# Patient Record
Sex: Female | Born: 1960
Health system: Southern US, Community
[De-identification: ages and names within clinical notes are randomized; demographics above are authoritative.]

## PROBLEM LIST (undated history)

## (undated) DIAGNOSIS — M1990 Unspecified osteoarthritis, unspecified site: Secondary | ICD-10-CM

## (undated) DIAGNOSIS — J449 Chronic obstructive pulmonary disease, unspecified: Secondary | ICD-10-CM

## (undated) DIAGNOSIS — J45909 Unspecified asthma, uncomplicated: Secondary | ICD-10-CM

## (undated) DIAGNOSIS — K219 Gastro-esophageal reflux disease without esophagitis: Secondary | ICD-10-CM

## (undated) DIAGNOSIS — E785 Hyperlipidemia, unspecified: Secondary | ICD-10-CM

## (undated) DIAGNOSIS — G43909 Migraine, unspecified, not intractable, without status migrainosus: Secondary | ICD-10-CM

## (undated) DIAGNOSIS — F32A Depression, unspecified: Secondary | ICD-10-CM

## (undated) DIAGNOSIS — G4730 Sleep apnea, unspecified: Secondary | ICD-10-CM

## (undated) DIAGNOSIS — F329 Major depressive disorder, single episode, unspecified: Secondary | ICD-10-CM

## (undated) DIAGNOSIS — J439 Emphysema, unspecified: Secondary | ICD-10-CM

## (undated) DIAGNOSIS — F419 Anxiety disorder, unspecified: Secondary | ICD-10-CM

## (undated) HISTORY — PX: RHINOPLASTY: SUR1284

## (undated) HISTORY — PX: SPINE SURGERY: SHX786

## (undated) HISTORY — DX: Hyperlipidemia, unspecified: E78.5

## (undated) HISTORY — DX: Sleep apnea, unspecified: G47.30

## (undated) HISTORY — DX: Unspecified asthma, uncomplicated: J45.909

## (undated) HISTORY — DX: Migraine, unspecified, not intractable, without status migrainosus: G43.909

## (undated) HISTORY — PX: ABDOMINAL HYSTERECTOMY: SHX81

## (undated) HISTORY — DX: Emphysema, unspecified: J43.9

## (undated) HISTORY — DX: Major depressive disorder, single episode, unspecified: F32.9

## (undated) HISTORY — DX: Anxiety disorder, unspecified: F41.9

## (undated) HISTORY — DX: Gastro-esophageal reflux disease without esophagitis: K21.9

## (undated) HISTORY — DX: Depression, unspecified: F32.A

## (undated) HISTORY — PX: BRAIN SURGERY: SHX531

## (undated) HISTORY — DX: Unspecified osteoarthritis, unspecified site: M19.90

## (undated) HISTORY — PX: TUBAL LIGATION: SHX77

## (undated) HISTORY — DX: Chronic obstructive pulmonary disease, unspecified: J44.9

---

## 2011-10-10 LAB — PULMONARY FUNCTION TEST

## 2013-01-20 HISTORY — PX: CERVICAL FUSION: SHX112

## 2013-10-10 DIAGNOSIS — G9520 Unspecified cord compression: Secondary | ICD-10-CM | POA: Insufficient documentation

## 2013-11-01 LAB — PULMONARY FUNCTION TEST

## 2014-02-20 ENCOUNTER — Encounter: Payer: Self-pay | Admitting: Family Medicine

## 2014-02-20 ENCOUNTER — Ambulatory Visit (INDEPENDENT_AMBULATORY_CARE_PROVIDER_SITE_OTHER): Payer: Managed Care, Other (non HMO) | Admitting: Family Medicine

## 2014-02-20 VITALS — BP 115/84 | HR 106 | Temp 97.8°F | Ht 62.0 in | Wt 169.0 lb

## 2014-02-20 DIAGNOSIS — G609 Hereditary and idiopathic neuropathy, unspecified: Secondary | ICD-10-CM

## 2014-02-20 DIAGNOSIS — G4459 Other complicated headache syndrome: Secondary | ICD-10-CM

## 2014-02-20 DIAGNOSIS — M79641 Pain in right hand: Secondary | ICD-10-CM

## 2014-02-20 DIAGNOSIS — M79642 Pain in left hand: Secondary | ICD-10-CM

## 2014-02-20 DIAGNOSIS — M25542 Pain in joints of left hand: Secondary | ICD-10-CM

## 2014-02-20 DIAGNOSIS — M25541 Pain in joints of right hand: Secondary | ICD-10-CM

## 2014-02-20 LAB — POCT CBC
GRANULOCYTE PERCENT: 46.8 % (ref 37–80)
HCT, POC: 45.1 % (ref 37.7–47.9)
Hemoglobin: 13.7 g/dL (ref 12.2–16.2)
LYMPH, POC: 2.8 (ref 0.6–3.4)
MCH, POC: 27.4 pg (ref 27–31.2)
MCHC: 30.5 g/dL — AB (ref 31.8–35.4)
MCV: 89.8 fL (ref 80–97)
MPV: 6.8 fL (ref 0–99.8)
PLATELET COUNT, POC: 363 10*3/uL (ref 142–424)
POC Granulocyte: 2.8 (ref 2–6.9)
POC LYMPH %: 46.3 % (ref 10–50)
RBC: 5 M/uL (ref 4.04–5.48)
RDW, POC: 13.4 %
WBC: 6 10*3/uL (ref 4.6–10.2)

## 2014-02-20 MED ORDER — TOPIRAMATE 25 MG PO TABS: ORAL_TABLET | ORAL | Status: DC

## 2014-02-20 NOTE — Patient Instructions (Signed)
Take a 325 mg enteric coated aspirin daily.

## 2014-02-20 NOTE — Progress Notes (Signed)
Subjective:    Patient ID: Amy Gray, female    DOB: 1960/10/21, 54 y.o.   MRN: [id number]  HPI  Patient is here today to establish care.  She is also having some extremity weakness and intermitent dizziness. Patient has moderate diffuse weakness. She has been under the care of neurology. Dr. Tod Persia in [place] has had an MRI done on her head recently that showed some white matter changes that he felt were nonspecific and not likely to be multiple sclerosis. However no diagnosis was made. She continues to have headache intermittent weakness and numbness that is episodic and migratory. MRI report of the brain is reviewed and is attached.     Review of Systems  Constitutional: Negative for fever, chills, diaphoresis, appetite change, fatigue and unexpected weight change.  HENT: Negative for congestion, ear pain, hearing loss, postnasal drip, rhinorrhea, sneezing, sore throat and trouble swallowing.   Eyes: Negative for pain.  Respiratory: Negative for cough, chest tightness and shortness of breath.   Cardiovascular: Negative for chest pain and palpitations.  Gastrointestinal: Negative for nausea, vomiting, abdominal pain, diarrhea and constipation.  Genitourinary: Negative for dysuria, frequency and menstrual problem.  Musculoskeletal: Negative for joint swelling and arthralgias.  Skin: Negative for rash.  Neurological: Positive for dizziness (described as lightheadedness without vertigo), weakness, numbness and headaches (frontal aching without throbbing or vision change). Negative for tremors, seizures, syncope and speech difficulty.  Psychiatric/Behavioral: Negative for dysphoric mood and agitation.       Objective:   Physical Exam  Constitutional: She is oriented to person, place, and time. She appears well-developed and well-nourished. No distress.  HENT:  Head: Normocephalic and atraumatic.  Right Ear: External ear normal.  Left Ear: External ear normal.  Nose: Nose  normal.  Mouth/Throat: Oropharynx is clear and moist.  Eyes: Conjunctivae and EOM are normal. Pupils are equal, round, and reactive to light.  Neck: Normal range of motion. Neck supple. No thyromegaly present.  Cardiovascular: Normal rate, regular rhythm and normal heart sounds.   No murmur heard. Pulmonary/Chest: Effort normal and breath sounds normal. No respiratory distress. She has no wheezes. She has no rales.  Abdominal: Soft. Bowel sounds are normal. She exhibits no distension. There is no tenderness.  Lymphadenopathy:    She has no cervical adenopathy.  Neurological: She is alert and oriented to person, place, and time. She has normal reflexes.  Skin: Skin is warm and dry.  Psychiatric: She has a normal mood and affect. Her behavior is normal. Judgment and thought content normal.   BP 115/84 mmHg  Pulse 106  Temp(Src) 97.8 F (36.6 C) (Oral)  Ht  (1.575 m)  Wt 169 lb (76.658 kg)  BMI 30.90 kg/m2        Assessment & Plan:   1. Other complicated headache syndrome   2. Hereditary and idiopathic peripheral neuropathy   3. Arthralgia of both hands     Meds ordered this encounter  Medications  . albuterol (PROVENTIL HFA;VENTOLIN HFA) 108 (90 BASE) MCG/ACT inhaler    Sig: Inhale 2 puffs into the lungs.  Marland Kitchen DISCONTD: albuterol (PROVENTIL) (2.5 MG/3ML) 0.083% nebulizer solution    Sig: 2.5 mg.  . DISCONTD: ALPRAZolam (XANAX) 0.25 MG tablet    Sig: Take 0.5 mg by mouth.  Marland Kitchen atorvastatin (LIPITOR) 40 MG tablet    Sig: Take 40 mg by mouth.  . budesonide-formoterol (SYMBICORT) 160-4.5 MCG/ACT inhaler    Sig: Inhale 2 puffs into the lungs.  . cetirizine (ZYRTEC) 10 MG  tablet    Sig: Take 10 mg by mouth.  . Cholecalciferol (VITAMIN D3) 5000 UNITS TABS    Sig: Take 1 tablet by mouth.  . cyclobenzaprine (FLEXERIL) 10 MG tablet    Sig: Take 10 mg by mouth.  . DISCONTD: estradiol (ESTRACE) 0.5 MG tablet    Sig: Take 0.5 mg by mouth.  Marland Kitchen. HYDROcodone-acetaminophen (VICODIN)  5-500 MG per tablet    Sig: Take 1 tablet by mouth.  . montelukast (SINGULAIR) 10 MG tablet    Sig: Take 10 mg by mouth.  . DISCONTD: traMADol (ULTRAM) 50 MG tablet    Sig: 1-2 tablets every 6 hours as needed  . omeprazole (PRILOSEC) 20 MG capsule    Sig: Take 20 mg by mouth.  . DISCONTD: HYDROcodone-acetaminophen (NORCO/VICODIN) 5-325 MG per tablet    Sig:   . estradiol (ESTRACE) 0.5 MG tablet    Sig:   . DISCONTD: cyclobenzaprine (FLEXERIL) 10 MG tablet    Sig:   . traMADol (ULTRAM) 50 MG tablet    Sig:   . ALPRAZolam (XANAX) 0.5 MG tablet    Sig:   . topiramate (TOPAMAX) 25 MG tablet    Sig: One at bedtime for 1 week. Then 2 for 1 week. Then 3 for 1 week. Then 4 a day.    Dispense:  120 tablet    Refill:  2    Orders Placed This Encounter  Procedures  . C-reactive protein  . Rheumatoid factor  . POCT CBC    Labs pending Health Maintenance reviewed Diet and exercise encouraged Continue all meds as discussed Follow up in 3 mos  Mechele ClaudeWarren Edee Nifong, MD

## 2014-02-21 ENCOUNTER — Telehealth: Payer: Self-pay | Admitting: Family Medicine

## 2014-02-21 LAB — C-REACTIVE PROTEIN: CRP: 1.8 mg/L (ref 0.0–4.9)

## 2014-02-21 LAB — RHEUMATOID FACTOR: Rhuematoid fact SerPl-aCnc: 9.9 IU/mL (ref 0.0–13.9)

## 2014-02-21 NOTE — Telephone Encounter (Signed)
Patient complaining of heaviness and weight in her chest last night. Gave call to triage.

## 2014-02-22 ENCOUNTER — Telehealth: Payer: Self-pay | Admitting: Family Medicine

## 2014-02-22 MED ORDER — SERTRALINE HCL 100 MG PO TABS: 100.0000 mg | ORAL_TABLET | Freq: Every day | ORAL | Status: DC

## 2014-02-22 NOTE — Telephone Encounter (Signed)
I agree that anxiety is very likely. A scrip for sertraline  has been sent to her pharmacy.

## 2014-02-22 NOTE — Telephone Encounter (Signed)
Patient aware and verbalizes understanding. 

## 2014-02-22 NOTE — Telephone Encounter (Signed)
Patient notified of lab results. States that she called our office yesterday with symptoms of a heart attack and triage advised her to go to the ER. Patient went to [facility] and they did full cardiac work up which was negative. They seem to be leaning towards anxiety and anxiety attacks. Patient states that she takes xanax 0.5 bid and has been on this for several years. Wonders if she needs something longer lasting. States that she tried cymbalta a few years ago and it made her very irritable. Also states that sometimes at night she will wake up and be very angry. Wants to know if a new med can be tried. Please advise and [facility]

## 2014-03-01 DIAGNOSIS — J45909 Unspecified asthma, uncomplicated: Secondary | ICD-10-CM | POA: Insufficient documentation

## 2014-03-01 DIAGNOSIS — E782 Mixed hyperlipidemia: Secondary | ICD-10-CM | POA: Insufficient documentation

## 2014-03-01 DIAGNOSIS — R002 Palpitations: Secondary | ICD-10-CM | POA: Insufficient documentation

## 2014-03-07 ENCOUNTER — Telehealth: Payer: Self-pay | Admitting: Family Medicine

## 2014-03-07 ENCOUNTER — Other Ambulatory Visit: Payer: Self-pay | Admitting: *Deleted

## 2014-03-07 MED ORDER — MONTELUKAST SODIUM 10 MG PO TABS: 10.0000 mg | ORAL_TABLET | Freq: Every day | ORAL | Status: DC

## 2014-03-07 NOTE — Telephone Encounter (Signed)
Have her DC the propranolol. We will sort this out better at her follow up

## 2014-03-07 NOTE — Telephone Encounter (Signed)
Patient feels really agitated and has restless legs about 1 hour after taking the propanolol.  This was prescribed at [facility] for palpitations.  She has appointment to follow up on 03/14/14. She is able to monitor her blood pressure and pulse at home. Asked her to keep a diary of these readings and any symptoms she is having and to bring it to her appointment.

## 2014-03-07 NOTE — Telephone Encounter (Signed)
Patient advised.

## 2014-03-14 ENCOUNTER — Ambulatory Visit (INDEPENDENT_AMBULATORY_CARE_PROVIDER_SITE_OTHER): Payer: Managed Care, Other (non HMO) | Admitting: Family Medicine

## 2014-03-14 ENCOUNTER — Encounter: Payer: Self-pay | Admitting: Family Medicine

## 2014-03-14 VITALS — BP 105/66 | HR 95 | Temp 97.4°F | Ht 62.0 in | Wt 166.8 lb

## 2014-03-14 DIAGNOSIS — F411 Generalized anxiety disorder: Secondary | ICD-10-CM

## 2014-03-14 DIAGNOSIS — J4530 Mild persistent asthma, uncomplicated: Secondary | ICD-10-CM | POA: Diagnosis not present

## 2014-03-14 DIAGNOSIS — R002 Palpitations: Secondary | ICD-10-CM

## 2014-03-14 MED ORDER — ALPRAZOLAM 0.5 MG PO TABS: 0.5000 mg | ORAL_TABLET | Freq: Two times a day (BID) | ORAL | Status: DC

## 2014-03-14 NOTE — Progress Notes (Signed)
Subjective:  Patient ID: Amy Gray, female    DOB: 1960/07/28  Age: 54 y.o. MRN: [id number]  CC: Follow-up   HPI Amy Gray presents for in today for follow-up of multiple symptoms including chest pain and paresthesia. During this last month she went ahead and had her stress echocardiogram which turned out normal. However before that could be done she went to the emergency room for chest pain. She was started on propranolol and after few days that made her feel so agitated that we stop that. The symptom went away. However it's been replaced by some new symptoms. She also stated that when she increased to the sertraline to 100 mg a full tablet, that she experienced agitation with that as well. She is still having headaches regularly and saw the headache clinic just yesterday. That note is attached in care everywhere. Basically she was told to try baclofen to relieve her headache. She is also taking cyclobenzaprine she was told not to blend of those 2. The patient for the last several weeks has had twitching in her arms and legs it is not synchronous in anyway it's twitches first-year then they're unpredictably. There is no association with activity per se and no rhyme or reason to where it's can happen. Patient started last week drinking more water and less of other fluids and has noted that her constant headache has diminished somewhat. Patient intends to follow with the headache clinic for headache   History Amy Gray has no past medical history on file.   She has no past surgical history on file.   Her family history is not on file.She reports that she has never smoked. She does not have any smokeless tobacco history on file. She reports that she drinks alcohol. She reports that she does not use illicit drugs.  Current Outpatient Prescriptions on File Prior to Visit  Medication Sig Dispense Refill  . albuterol (PROVENTIL HFA;VENTOLIN HFA) 108 (90 BASE) MCG/ACT inhaler Inhale 2 puffs  into the lungs.    Marland Kitchen. atorvastatin (LIPITOR) 40 MG tablet Take 40 mg by mouth.    . budesonide-formoterol (SYMBICORT) 160-4.5 MCG/ACT inhaler Inhale 2 puffs into the lungs.    . Cholecalciferol (VITAMIN D3) 5000 UNITS TABS Take 1 tablet by mouth.    . estradiol (ESTRACE) 0.5 MG tablet     . montelukast (SINGULAIR) 10 MG tablet Take 1 tablet (10 mg total) by mouth at bedtime. 30 tablet 2  . omeprazole (PRILOSEC) 20 MG capsule Take 20 mg by mouth.    . sertraline (ZOLOFT) 100 MG tablet Take 1 tablet (100 mg total) by mouth daily. For the first week take only 1/2 tablet daily. Then increase to a full dose. 30 tablet 5  . traMADol (ULTRAM) 50 MG tablet      No current facility-administered medications on file prior to visit.    ROS Review of Systems  Constitutional: Negative for fever, chills, diaphoresis, appetite change, fatigue and unexpected weight change.  HENT: Negative for congestion, ear pain, hearing loss, postnasal drip, rhinorrhea, sneezing, sore throat and trouble swallowing.   Eyes: Negative for pain.  Respiratory: Negative for cough, chest tightness and shortness of breath.   Cardiovascular: Negative for chest pain and palpitations.  Gastrointestinal: Negative for nausea, vomiting, abdominal pain, diarrhea and constipation.  Genitourinary: Negative for dysuria, frequency and menstrual problem.  Musculoskeletal: Negative for joint swelling and arthralgias.  Skin: Negative for rash.  Neurological: Negative for dizziness, weakness, numbness and headaches.  Psychiatric/Behavioral: Negative for dysphoric mood  and agitation.    Objective:  BP 105/66 mmHg  Pulse 95  Temp(Src) 97.4 F (36.3 C) (Oral)  Ht  (1.575 m)  Wt 166 lb 12.8 oz (75.66 kg)  BMI 30.50 kg/m2  BP Readings from Last 3 Encounters:  03/14/14 105/66  02/20/14 115/84    Wt Readings from Last 3 Encounters:  03/14/14 166 lb 12.8 oz (75.66 kg)  02/20/14 169 lb (76.658 kg)     Physical Exam    Constitutional: She is oriented to person, place, and time. She appears well-developed and well-nourished. No distress.  HENT:  Head: Normocephalic and atraumatic.  Right Ear: External ear normal.  Left Ear: External ear normal.  Nose: Nose normal.  Mouth/Throat: Oropharynx is clear and moist.  Eyes: Conjunctivae and EOM are normal. Pupils are equal, round, and reactive to light.  Neck: Normal range of motion. Neck supple. No thyromegaly present.  Cardiovascular: Normal rate, regular rhythm and normal heart sounds.   No murmur heard. Pulmonary/Chest: Effort normal and breath sounds normal. No respiratory distress. She has no wheezes. She has no rales.  Abdominal: Soft. Bowel sounds are normal. She exhibits no distension. There is no tenderness.  Lymphadenopathy:    She has no cervical adenopathy.  Neurological: She is alert and oriented to person, place, and time. She has normal reflexes.  Skin: Skin is warm and dry.  Psychiatric: She has a normal mood and affect. Her behavior is normal. Judgment and thought content normal.    No results found for: HGBA1C  Lab Results  Component Value Date   WBC 6.0 02/20/2014   HGB 13.7 02/20/2014   HCT 45.1 02/20/2014    Patient was never admitted.  Assessment & Plan:   There are no diagnoses linked to this encounter. I have discontinued Amy Gray's cetirizine, cyclobenzaprine, and HYDROcodone-acetaminophen. I have also changed her ALPRAZolam. Additionally, I am having her maintain her albuterol, atorvastatin, budesonide-formoterol, Vitamin D3, omeprazole, estradiol, traMADol, sertraline, montelukast, aspirin, baclofen, ranitidine, and topiramate.  Meds ordered this encounter  Medications  . aspirin 325 MG EC tablet    Sig: Take 325 mg by mouth daily.  . baclofen (LIORESAL) 10 MG tablet    Sig: Take 1 tablet by mouth 3 (three) times daily.  . ranitidine (ZANTAC) 150 MG tablet    Sig: Take 150 mg by mouth daily.  Marland Kitchen ALPRAZolam (XANAX)  0.5 MG tablet    Sig: Take 1 tablet (0.5 mg total) by mouth 2 (two) times daily.    Dispense:  60 tablet    Refill:  2  . topiramate (TOPAMAX) 25 MG tablet    Sig:     Comments: DC propranolol Follow-up: Return in about 1 month (around 04/12/2014).  Mechele Claude, M.D. [facility]er

## 2014-03-14 NOTE — Patient Instructions (Signed)
Patient should continue the sertraline at one half tablet daily.  Discontinue the Flexeril and the propranolol  She you should continue the alprazolam/Xanax 1 tablet twice daily.

## 2014-03-27 ENCOUNTER — Ambulatory Visit: Payer: Managed Care, Other (non HMO) | Admitting: Family Medicine

## 2014-04-12 ENCOUNTER — Encounter: Payer: Self-pay | Admitting: Family Medicine

## 2014-04-12 ENCOUNTER — Ambulatory Visit (INDEPENDENT_AMBULATORY_CARE_PROVIDER_SITE_OTHER): Payer: Managed Care, Other (non HMO) | Admitting: Family Medicine

## 2014-04-12 ENCOUNTER — Encounter (INDEPENDENT_AMBULATORY_CARE_PROVIDER_SITE_OTHER): Payer: Self-pay

## 2014-04-12 VITALS — BP 102/72 | HR 76 | Temp 97.6°F | Ht 62.0 in | Wt 169.8 lb

## 2014-04-12 DIAGNOSIS — J4530 Mild persistent asthma, uncomplicated: Secondary | ICD-10-CM | POA: Diagnosis not present

## 2014-04-12 DIAGNOSIS — G4459 Other complicated headache syndrome: Secondary | ICD-10-CM | POA: Diagnosis not present

## 2014-04-12 NOTE — Progress Notes (Signed)
Subjective:  Patient ID: Amy Gray, female    DOB: Jan 10, 1961  Age: 54 y.o. MRN: [id number]  CC: Peripheral Neuropathy and Depression   HPI Amy Gray presents for headache onset 6-7 mos ago INtractable. Saw HA clinic & taken off al meds. Trying to get Botox injections approved since that appears to be the only thing by the headache clinic's evaluation that would be useful without rebound headaches. Amy Gray's been limited to Tylenol once weekly. Baclofen only for severe HA, and nothing else for pain. That has led to severe pain in the bilateral lower extremity specifically in the hip and the knee and into the lower leg and feet. Using heat frequently at home. This gives partial relief. Using a special cushion. Patient continues to have days when Amy Gray short of breath. Comes in spells that can come his every day and last all day long History Amy Gray has no past medical history on file.   Amy Gray has no past surgical history on file.   Her family history is not on file.Amy Gray reports that Amy Gray has never smoked. Amy Gray does not have any smokeless tobacco history on file. Amy Gray reports that Amy Gray drinks alcohol. Amy Gray reports that Amy Gray does not use illicit drugs.  Current Outpatient Prescriptions on File Prior to Visit  Medication Sig Dispense Refill  . albuterol (PROVENTIL HFA;VENTOLIN HFA) 108 (90 BASE) MCG/ACT inhaler Inhale 2 puffs into the lungs.    . ALPRAZolam (XANAX) 0.5 MG tablet Take 1 tablet (0.5 mg total) by mouth 2 (two) times daily. 60 tablet 2  . aspirin 325 MG EC tablet Take 325 mg by mouth daily.    Marland Kitchen atorvastatin (LIPITOR) 40 MG tablet Take 40 mg by mouth.    . baclofen (LIORESAL) 10 MG tablet Take 1 tablet by mouth 3 (three) times daily as needed.     . budesonide-formoterol (SYMBICORT) 160-4.5 MCG/ACT inhaler Inhale 2 puffs into the lungs.    . Cholecalciferol (VITAMIN D3) 5000 UNITS TABS Take 1 tablet by mouth.    . estradiol (ESTRACE) 0.5 MG tablet     . montelukast (SINGULAIR) 10  MG tablet Take 1 tablet (10 mg total) by mouth at bedtime. 30 tablet 2  . ranitidine (ZANTAC) 150 MG tablet Take 150 mg by mouth daily.    . sertraline (ZOLOFT) 100 MG tablet Take 1 tablet (100 mg total) by mouth daily. For the first week take only 1/2 tablet daily. Then increase to a full dose. (Patient taking differently: Take 50 mg by mouth daily. For the first week take only 1/2 tablet daily. Then increase to a full dose.) 30 tablet 5  . traMADol (ULTRAM) 50 MG tablet      No current facility-administered medications on file prior to visit.    ROS Review of Systems  Constitutional: Negative for fever, chills, diaphoresis, appetite change, fatigue and unexpected weight change.  HENT: Negative for congestion, ear pain, hearing loss, postnasal drip, rhinorrhea, sneezing, sore throat and trouble swallowing.   Eyes: Negative for pain.  Respiratory: Negative for cough, chest tightness and shortness of breath.   Cardiovascular: Negative for chest pain and palpitations.  Gastrointestinal: Negative for nausea, vomiting, abdominal pain, diarrhea and constipation.  Genitourinary: Negative for dysuria, frequency and menstrual problem.  Musculoskeletal: Negative for joint swelling and arthralgias.  Skin: Negative for rash.  Neurological: Positive for light-headedness and headaches. Negative for dizziness, weakness and numbness.  Psychiatric/Behavioral: Negative for dysphoric mood and agitation.    Objective:  BP 102/72 mmHg  Pulse 76  Temp(Src) 97.6 F (36.4 C) (Oral)  Ht 5\' 2"  (1.575 m)  Wt 169 lb 12.8 oz (77.021 kg)  BMI 31.05 kg/m2  BP Readings from Last 3 Encounters:  04/12/14 102/72  03/14/14 105/66  02/20/14 115/84    Wt Readings from Last 3 Encounters:  04/12/14 169 lb 12.8 oz (77.021 kg)  03/14/14 166 lb 12.8 oz (75.66 kg)  02/20/14 169 lb (76.658 kg)     Physical Exam  Constitutional: Amy Gray is oriented to person, place, and time. Amy Gray appears well-developed and  well-nourished. No distress.  HENT:  Head: Normocephalic and atraumatic.  Right Ear: External ear normal.  Left Ear: External ear normal.  Nose: Nose normal.  Mouth/Throat: Oropharynx is clear and moist.  Eyes: Conjunctivae and EOM are normal. Pupils are equal, round, and reactive to light.  Neck: Normal range of motion. Neck supple. No thyromegaly present.  Cardiovascular: Normal rate, regular rhythm and normal heart sounds.   No murmur heard. Pulmonary/Chest: Effort normal and breath sounds normal. No respiratory distress. Amy Gray has no wheezes. Amy Gray has no rales.  Abdominal: Soft. Bowel sounds are normal. Amy Gray exhibits no distension. There is no tenderness.  Lymphadenopathy:    Amy Gray has no cervical adenopathy.  Neurological: Amy Gray is alert and oriented to person, place, and time. Amy Gray has normal reflexes.  Skin: Skin is warm and dry.  Psychiatric: Amy Gray has a normal mood and affect. Her behavior is normal. Judgment and thought content normal.    No results found for: HGBA1C  Lab Results  Component Value Date   WBC 6.0 02/20/2014   HGB 13.7 02/20/2014   HCT 45.1 02/20/2014    Patient was never admitted.  Assessment & Plan:   Amy Gray was seen today for peripheral neuropathy and depression.  Diagnoses and all orders for this visit:  Other complicated headache syndrome  Asthma, chronic, mild persistent, uncomplicated  I have discontinued Amy Gray's omeprazole and topiramate. I am also having her maintain her albuterol, atorvastatin, budesonide-formoterol, Vitamin D3, estradiol, traMADol, sertraline, montelukast, aspirin, baclofen, ranitidine, and ALPRAZolam.  No orders of the defined types were placed in this encounter.     Follow-up: Return in about 3 months (around 07/13/2014).  Mechele ClaudeWarren Dequincy Born, M.D.

## 2014-04-12 NOTE — Patient Instructions (Signed)
Try to increase sertraline to a whole pill daily. You can do 1/2 twice daily if preferred.

## 2014-06-08 ENCOUNTER — Other Ambulatory Visit: Payer: Self-pay

## 2014-06-08 MED ORDER — MONTELUKAST SODIUM 10 MG PO TABS: 10.0000 mg | ORAL_TABLET | Freq: Every day | ORAL | Status: DC

## 2014-07-04 ENCOUNTER — Encounter: Payer: Self-pay | Admitting: *Deleted

## 2014-07-11 ENCOUNTER — Encounter (INDEPENDENT_AMBULATORY_CARE_PROVIDER_SITE_OTHER): Payer: Self-pay

## 2014-07-11 ENCOUNTER — Other Ambulatory Visit (INDEPENDENT_AMBULATORY_CARE_PROVIDER_SITE_OTHER): Payer: Managed Care, Other (non HMO)

## 2014-07-11 DIAGNOSIS — Z0000 Encounter for general adult medical examination without abnormal findings: Secondary | ICD-10-CM

## 2014-07-11 DIAGNOSIS — Z0189 Encounter for other specified special examinations: Secondary | ICD-10-CM

## 2014-07-11 LAB — POCT CBC
Granulocyte percent: 52.6 %G (ref 37–80)
HEMATOCRIT: 41.3 % (ref 37.7–47.9)
Hemoglobin: 12.9 g/dL (ref 12.2–16.2)
Lymph, poc: 2 (ref 0.6–3.4)
MCH: 28 pg (ref 27–31.2)
MCHC: 31.3 g/dL — AB (ref 31.8–35.4)
MCV: 89.4 fL (ref 80–97)
MPV: 7.9 fL (ref 0–99.8)
POC GRANULOCYTE: 2.7 (ref 2–6.9)
POC LYMPH PERCENT: 38.6 %L (ref 10–50)
Platelet Count, POC: 263 10*3/uL (ref 142–424)
RBC: 4.62 M/uL (ref 4.04–5.48)
RDW, POC: 13.4 %
WBC: 5.2 10*3/uL (ref 4.6–10.2)

## 2014-07-11 LAB — POCT URINALYSIS DIPSTICK
BILIRUBIN UA: NEGATIVE
Blood, UA: NEGATIVE
GLUCOSE UA: NEGATIVE
KETONES UA: NEGATIVE
NITRITE UA: NEGATIVE
Protein, UA: NEGATIVE
Spec Grav, UA: 1.02
Urobilinogen, UA: NEGATIVE
pH, UA: 5

## 2014-07-11 LAB — POCT UA - MICROSCOPIC ONLY
BACTERIA, U MICROSCOPIC: NEGATIVE
CASTS, UR, LPF, POC: NEGATIVE
Crystals, Ur, HPF, POC: NEGATIVE
Mucus, UA: NEGATIVE
RBC, urine, microscopic: NEGATIVE
Yeast, UA: NEGATIVE

## 2014-07-11 NOTE — Progress Notes (Signed)
Lab only 

## 2014-07-11 NOTE — Addendum Note (Signed)
Addended by: Bearl Mulberry on: 07/11/2014 02:49 PM   Modules accepted: Orders

## 2014-07-12 LAB — CMP14+EGFR
A/G RATIO: 1.6 (ref 1.1–2.5)
ALBUMIN: 4.2 g/dL (ref 3.5–5.5)
ALK PHOS: 121 IU/L — AB (ref 39–117)
ALT: 27 IU/L (ref 0–32)
AST: 23 IU/L (ref 0–40)
BUN / CREAT RATIO: 17 (ref 9–23)
BUN: 15 mg/dL (ref 6–24)
Bilirubin Total: 0.4 mg/dL (ref 0.0–1.2)
CO2: 23 mmol/L (ref 18–29)
CREATININE: 0.88 mg/dL (ref 0.57–1.00)
Calcium: 9.3 mg/dL (ref 8.7–10.2)
Chloride: 104 mmol/L (ref 97–108)
GFR calc non Af Amer: 75 mL/min/{1.73_m2} (ref >59)
GFR, EST AFRICAN AMERICAN: 86 mL/min/{1.73_m2} (ref >59)
GLOBULIN, TOTAL: 2.6 g/dL (ref 1.5–4.5)
Glucose: 90 mg/dL (ref 65–99)
Potassium: 4.5 mmol/L (ref 3.5–5.2)
SODIUM: 142 mmol/L (ref 134–144)
Total Protein: 6.8 g/dL (ref 6.0–8.5)

## 2014-07-12 LAB — NMR, LIPOPROFILE
Cholesterol: 191 mg/dL (ref 100–199)
HDL Cholesterol by NMR: 63 mg/dL (ref >39)
HDL Particle Number: 29.4 umol/L — ABNORMAL LOW (ref >=30.5)
LDL PARTICLE NUMBER: 996 nmol/L (ref <1000)
LDL SIZE: 21.1 nm (ref >20.5)
LDL-C: 110 mg/dL — ABNORMAL HIGH (ref 0–99)
LP-IR SCORE: 34 (ref <=45)
Small LDL Particle Number: 367 nmol/L (ref <=527)
Triglycerides by NMR: 92 mg/dL (ref 0–149)

## 2014-07-12 LAB — THYROID PANEL WITH TSH
Free Thyroxine Index: 2 (ref 1.2–4.9)
T3 Uptake Ratio: 30 % (ref 24–39)
T4, Total: 6.8 ug/dL (ref 4.5–12.0)
TSH: 2.58 u[IU]/mL (ref 0.450–4.500)

## 2014-07-12 LAB — VITAMIN D 25 HYDROXY (VIT D DEFICIENCY, FRACTURES): VIT D 25 HYDROXY: 42 ng/mL (ref 30.0–100.0)

## 2014-07-14 ENCOUNTER — Ambulatory Visit (INDEPENDENT_AMBULATORY_CARE_PROVIDER_SITE_OTHER): Payer: Managed Care, Other (non HMO) | Admitting: Family Medicine

## 2014-07-14 ENCOUNTER — Telehealth: Payer: Self-pay

## 2014-07-14 ENCOUNTER — Encounter (INDEPENDENT_AMBULATORY_CARE_PROVIDER_SITE_OTHER): Payer: Self-pay

## 2014-07-14 VITALS — BP 106/74 | HR 61 | Temp 97.6°F | Ht 62.0 in | Wt 172.8 lb

## 2014-07-14 DIAGNOSIS — G43709 Chronic migraine without aura, not intractable, without status migrainosus: Secondary | ICD-10-CM | POA: Diagnosis not present

## 2014-07-14 DIAGNOSIS — M797 Fibromyalgia: Secondary | ICD-10-CM

## 2014-07-14 MED ORDER — MIRABEGRON ER 50 MG PO TB24: 50.0000 mg | ORAL_TABLET | Freq: Every day | ORAL | Status: DC

## 2014-07-14 MED ORDER — ALPRAZOLAM 0.5 MG PO TABS: 0.5000 mg | ORAL_TABLET | Freq: Two times a day (BID) | ORAL | Status: DC

## 2014-07-14 MED ORDER — TRAMADOL HCL 50 MG PO TABS: 50.0000 mg | ORAL_TABLET | Freq: Four times a day (QID) | ORAL | Status: DC | PRN

## 2014-07-14 NOTE — Telephone Encounter (Signed)
Insurance approved [id number]

## 2014-07-14 NOTE — Progress Notes (Signed)
Subjective:  Patient ID: Amy Gray, female    DOB: Oct 27, 1960  Age: 54 y.o. MRN: [id number]  CC: Headache; Back Pain; and Neck Pain   HPI Amy Gray presents for multiple body aches and pains. She has had some relief with the baclofen. She has history of fibromyalgia. She states the pains are stable and manageable compared to usual. She does have to use the tramadol periodically for relief.  History Amy Gray has no past medical history on file.   She has no past surgical history on file.   Her family history is not on file.She reports that she has never smoked. She does not have any smokeless tobacco history on file. She reports that she drinks alcohol. She reports that she does not use illicit drugs.  Outpatient Prescriptions Prior to Visit  Medication Sig Dispense Refill  . albuterol (PROVENTIL HFA;VENTOLIN HFA) 108 (90 BASE) MCG/ACT inhaler Inhale 2 puffs into the lungs.    Marland Kitchen aspirin 325 MG EC tablet Take 325 mg by mouth daily.    Marland Kitchen atorvastatin (LIPITOR) 40 MG tablet Take 40 mg by mouth.    . baclofen (LIORESAL) 10 MG tablet Take 1 tablet by mouth 3 (three) times daily as needed.     . budesonide-formoterol (SYMBICORT) 160-4.5 MCG/ACT inhaler Inhale 2 puffs into the lungs.    . Cholecalciferol (VITAMIN D3) 5000 UNITS TABS Take 1 tablet by mouth.    . estradiol (ESTRACE) 0.5 MG tablet     . montelukast (SINGULAIR) 10 MG tablet Take 1 tablet (10 mg total) by mouth at bedtime. 30 tablet 2  . sertraline (ZOLOFT) 100 MG tablet Take 1 tablet (100 mg total) by mouth daily. For the first week take only 1/2 tablet daily. Then increase to a full dose. (Patient taking differently: Take 50 mg by mouth daily. For the first week take only 1/2 tablet daily. Then increase to a full dose.) 30 tablet 5  . ALPRAZolam (XANAX) 0.5 MG tablet Take 1 tablet (0.5 mg total) by mouth 2 (two) times daily. 60 tablet 2  . traMADol (ULTRAM) 50 MG tablet     . ranitidine (ZANTAC) 150 MG tablet Take  150 mg by mouth daily.     No facility-administered medications prior to visit.    ROS Review of Systems  Constitutional: Negative for fever, chills, diaphoresis, appetite change, fatigue and unexpected weight change.  HENT: Negative for congestion, ear pain, hearing loss, postnasal drip, rhinorrhea, sneezing, sore throat and trouble swallowing.   Eyes: Negative for pain.  Respiratory: Negative for cough, chest tightness and shortness of breath.   Cardiovascular: Negative for chest pain and palpitations.  Gastrointestinal: Negative for nausea, vomiting, abdominal pain, diarrhea and constipation.  Genitourinary: Negative for dysuria, frequency and menstrual problem.  Musculoskeletal: Positive for myalgias, joint swelling and arthralgias.  Skin: Negative for rash.  Neurological: Negative for dizziness, weakness, numbness and headaches.  Psychiatric/Behavioral: Negative for dysphoric mood and agitation.    Objective:  BP 106/74 mmHg  Pulse 61  Temp(Src) 97.6 F (36.4 C) (Oral)  Ht  (1.575 m)  Wt 172 lb 12.8 oz (78.382 kg)  BMI 31.60 kg/m2  BP Readings from Last 3 Encounters:  07/14/14 106/74  04/12/14 102/72  03/14/14 105/66    Wt Readings from Last 3 Encounters:  07/14/14 172 lb 12.8 oz (78.382 kg)  04/12/14 169 lb 12.8 oz (77.021 kg)  03/14/14 166 lb 12.8 oz (75.66 kg)     Physical Exam  Constitutional: She is oriented to  person, place, and time. She appears well-developed and well-nourished. No distress.  HENT:  Head: Normocephalic and atraumatic.  Right Ear: External ear normal.  Left Ear: External ear normal.  Nose: Nose normal.  Mouth/Throat: Oropharynx is clear and moist.  Eyes: Conjunctivae and EOM are normal. Pupils are equal, round, and reactive to light.  Neck: Normal range of motion. Neck supple. No thyromegaly present.  Cardiovascular: Normal rate, regular rhythm and normal heart sounds.   No murmur heard. Pulmonary/Chest: Effort normal and breath  sounds normal. No respiratory distress. She has no wheezes. She has no rales.  Abdominal: Soft. Bowel sounds are normal. She exhibits no distension. There is no tenderness.  Lymphadenopathy:    She has no cervical adenopathy.  Neurological: She is alert and oriented to person, place, and time. She has normal reflexes.  Skin: Skin is warm and dry.  Psychiatric: She has a normal mood and affect. Her behavior is normal. Judgment and thought content normal.    No results found for: HGBA1C  Lab Results  Component Value Date   WBC 5.2 07/11/2014   HGB 12.9 07/11/2014   HCT 41.3 07/11/2014   GLUCOSE 90 07/11/2014   CHOL 191 07/11/2014   TRIG 92 07/11/2014   HDL 63 07/11/2014   ALT 27 07/11/2014   AST 23 07/11/2014   NA 142 07/11/2014   K 4.5 07/11/2014   CL 104 07/11/2014   CREATININE 0.88 07/11/2014   BUN 15 07/11/2014   CO2 23 07/11/2014   TSH 2.580 07/11/2014    Patient was never admitted.  Assessment & Plan:   Amy Gray was seen today for headache, back pain and neck pain.  Diagnoses and all orders for this visit:  Fibromyalgia syndrome  Chronic migraine without aura without status migrainosus, not intractable  Other orders -     mirabegron ER (MYRBETRIQ) 50 MG TB24 tablet; Take 1 tablet (50 mg total) by mouth daily. -     ALPRAZolam (XANAX) 0.5 MG tablet; Take 1 tablet (0.5 mg total) by mouth 2 (two) times daily. -     traMADol (ULTRAM) 50 MG tablet; Take 1 tablet (50 mg total) by mouth every 6 (six) hours as needed.  I have discontinued Amy Gray's ranitidine and DULoxetine. I have also changed her traMADol. Additionally, I am having her start on mirabegron ER. Lastly, I am having her maintain her albuterol, atorvastatin, budesonide-formoterol, Vitamin D3, estradiol, sertraline, aspirin, baclofen, montelukast, rOPINIRole, atenolol, omeprazole, rizatriptan, and ALPRAZolam.  Meds ordered this encounter  Medications  . rOPINIRole (REQUIP) 0.5 MG tablet    Sig: Take 2  tablets by mouth at bedtime.  Marland Kitchen DISCONTD: DULoxetine (CYMBALTA) 20 MG capsule    Sig: Take 1 capsule by mouth daily.  Marland Kitchen atenolol (TENORMIN) 25 MG tablet    Sig: Take 1 tablet by mouth daily.  Marland Kitchen omeprazole (PRILOSEC) 40 MG capsule    Sig: Take 40 mg by mouth daily.  . rizatriptan (MAXALT) 10 MG tablet    Sig: Take 10 mg by mouth.  . mirabegron ER (MYRBETRIQ) 50 MG TB24 tablet    Sig: Take 1 tablet (50 mg total) by mouth daily.    Dispense:  30 tablet    Refill:  5  . ALPRAZolam (XANAX) 0.5 MG tablet    Sig: Take 1 tablet (0.5 mg total) by mouth 2 (two) times daily.    Dispense:  60 tablet    Refill:  2  . traMADol (ULTRAM) 50 MG tablet    Sig:  Take 1 tablet (50 mg total) by mouth every 6 (six) hours as needed.    Dispense:  50 tablet    Refill:  2     Follow-up: Return in about 3 months (around 10/14/2014).  Mechele Claude, M.D.

## 2014-07-17 ENCOUNTER — Encounter: Payer: Self-pay | Admitting: Family Medicine

## 2014-07-18 ENCOUNTER — Other Ambulatory Visit: Payer: Self-pay | Admitting: Family Medicine

## 2014-07-31 ENCOUNTER — Other Ambulatory Visit: Payer: Self-pay | Admitting: Family Medicine

## 2014-08-30 ENCOUNTER — Other Ambulatory Visit: Payer: Self-pay | Admitting: Family Medicine

## 2014-09-28 ENCOUNTER — Other Ambulatory Visit: Payer: Self-pay | Admitting: Family Medicine

## 2014-10-03 ENCOUNTER — Encounter: Payer: Self-pay | Admitting: Family Medicine

## 2014-10-03 ENCOUNTER — Ambulatory Visit (INDEPENDENT_AMBULATORY_CARE_PROVIDER_SITE_OTHER): Payer: Managed Care, Other (non HMO) | Admitting: Family Medicine

## 2014-10-03 VITALS — BP 98/66 | HR 67 | Temp 96.8°F | Ht 62.0 in | Wt 178.6 lb

## 2014-10-03 DIAGNOSIS — F411 Generalized anxiety disorder: Secondary | ICD-10-CM | POA: Insufficient documentation

## 2014-10-03 DIAGNOSIS — R202 Paresthesia of skin: Secondary | ICD-10-CM

## 2014-10-03 DIAGNOSIS — G2581 Restless legs syndrome: Secondary | ICD-10-CM

## 2014-10-03 MED ORDER — ROPINIROLE HCL 2 MG PO TABS: 2.0000 mg | ORAL_TABLET | Freq: Every day | ORAL | Status: DC

## 2014-10-03 MED ORDER — ALPRAZOLAM 0.5 MG PO TABS: 0.5000 mg | ORAL_TABLET | Freq: Two times a day (BID) | ORAL | Status: DC

## 2014-10-03 NOTE — Progress Notes (Signed)
Subjective:  Patient ID: Amy Gray, female    DOB: April 06, 1960  Age: 54 y.o. MRN: [id number]  CC: numbness and tingling; Hyperlipidemia; Migraine; and Gastrophageal Reflux   HPI Amy Gray presents for Tingling in right arm. Had NCV with neuro, Dr. Luberta Robertson. It was read as NML. Now in legs and head, lips and tongue. Last night right  arm shooting out as in restless leg. Couldn't sleep. "Pins and needles." Sensation spread to her head and her throat. She is also having some burning sensation of the tongue. No numbness or weakness. No vision changes noted. There is no known predisposing factor. Insidious onset approximately 2 months ago. She brings in a note from a physician's assistant at her spine clinic stating that they would like for her to have B12 B6 and folate serum protein electrophoresis, a NA and thyroid testing performed. Of note is the patient has recently had normal thyroid test. She is taking an oral B12 supplement currently.  Patient currently is using alprazolam regularly for her anxiety. She denies any current exacerbating circumstances for her anxieties.  History Amy Gray has no past medical history on file.   She has no past surgical history on file.   Her family history is not on file.She reports that she has never smoked. She does not have any smokeless tobacco history on file. She reports that she drinks alcohol. She reports that she does not use illicit drugs.  Outpatient Prescriptions Prior to Visit  Medication Sig Dispense Refill  . aspirin 325 MG EC tablet Take 325 mg by mouth daily.    Marland Kitchen atenolol (TENORMIN) 25 MG tablet Take 2 tablets by mouth daily.     Marland Kitchen atorvastatin (LIPITOR) 40 MG tablet TAKE 1 TABLET BY MOUTH ONCE DAILY FOR CHOLESTEROL 30 tablet 5  . baclofen (LIORESAL) 10 MG tablet Take 1 tablet by mouth 2 (two) times daily.     . Cholecalciferol (VITAMIN D3) 5000 UNITS TABS Take 1 tablet by mouth.    . estradiol (ESTRACE) 0.5 MG tablet TAKE 1  TABLET BY MOUTH DAILY 30 tablet 5  . mirabegron ER (MYRBETRIQ) 50 MG TB24 tablet Take 1 tablet (50 mg total) by mouth daily. 30 tablet 5  . montelukast (SINGULAIR) 10 MG tablet TAKE 1 TABLET BY MOUTH AT BEDTIME 30 tablet 5  . omeprazole (PRILOSEC) 40 MG capsule Take 40 mg by mouth daily.    . rizatriptan (MAXALT) 10 MG tablet Take 10 mg by mouth.    . sertraline (ZOLOFT) 100 MG tablet TAKE 1 TABLET BY MOUTH DAILY 30 tablet 2  . SYMBICORT 160-4.5 MCG/ACT inhaler INHALE 2 PUFFS INTO THE LUNGS TWICE DAILY 10.2 g 1  . traMADol (ULTRAM) 50 MG tablet Take 1 tablet (50 mg total) by mouth every 6 (six) hours as needed. 50 tablet 2  . VENTOLIN HFA 108 (90 BASE) MCG/ACT inhaler INHALE 2 PUFFS INTO THE LUNGS FOUR TIMES A DAY AS NEEDED FOR BREATHING 18 g 3  . ALPRAZolam (XANAX) 0.5 MG tablet Take 1 tablet (0.5 mg total) by mouth 2 (two) times daily. 60 tablet 2  . rOPINIRole (REQUIP) 0.5 MG tablet Take 2 tablets by mouth at bedtime.     No facility-administered medications prior to visit.    ROS Review of Systems  Constitutional: Negative for fever, chills, diaphoresis, appetite change, fatigue and unexpected weight change.  HENT: Negative for congestion, ear pain, hearing loss, postnasal drip, rhinorrhea, sneezing, sore throat and trouble swallowing.   Eyes: Negative for pain.  Respiratory: Negative for cough, chest tightness and shortness of breath.   Cardiovascular: Negative for chest pain and palpitations.  Gastrointestinal: Negative for nausea, vomiting, abdominal pain, diarrhea and constipation.  Genitourinary: Negative for dysuria, frequency and menstrual problem.  Musculoskeletal: Negative for joint swelling and arthralgias.  Skin: Negative for rash.  Neurological: Positive for tremors. Negative for dizziness, weakness, numbness and headaches.  Psychiatric/Behavioral: Negative for dysphoric mood and agitation.    Objective:  BP 98/66 mmHg  Pulse 67  Temp(Src) 96.8 F (36 C) (Oral)  Ht  5\' 2"  (1.575 m)  Wt 178 lb 9.6 oz (81.012 kg)  BMI 32.66 kg/m2  BP Readings from Last 3 Encounters:  10/03/14 98/66  07/14/14 106/74  04/12/14 102/72    Wt Readings from Last 3 Encounters:  10/03/14 178 lb 9.6 oz (81.012 kg)  07/14/14 172 lb 12.8 oz (78.382 kg)  04/12/14 169 lb 12.8 oz (77.021 kg)     Physical Exam  Constitutional: She is oriented to person, place, and time. She appears well-developed and well-nourished. No distress.  HENT:  Head: Normocephalic and atraumatic.  Right Ear: External ear normal.  Left Ear: External ear normal.  Nose: Nose normal.  Mouth/Throat: Oropharynx is clear and moist.  Eyes: Conjunctivae and EOM are normal. Pupils are equal, round, and reactive to light.  Neck: Normal range of motion. Neck supple. No thyromegaly present.  Cardiovascular: Normal rate, regular rhythm and normal heart sounds.   No murmur heard. Pulmonary/Chest: Effort normal and breath sounds normal. No respiratory distress. She has no wheezes. She has no rales.  Abdominal: Soft. Bowel sounds are normal. She exhibits no distension. There is no tenderness.  Lymphadenopathy:    She has no cervical adenopathy.  Neurological: She is alert and oriented to person, place, and time. She has normal reflexes.  Skin: Skin is warm and dry.  Psychiatric: She has a normal mood and affect. Her behavior is normal. Judgment and thought content normal.    No results found for: HGBA1C  Lab Results  Component Value Date   WBC 5.2 07/11/2014   HGB 12.9 07/11/2014   HCT 41.3 07/11/2014   GLUCOSE 90 07/11/2014   CHOL 191 07/11/2014   TRIG 92 07/11/2014   HDL 63 07/11/2014   ALT 27 07/11/2014   AST 23 07/11/2014   NA 142 07/11/2014   K 4.5 07/11/2014   CL 104 07/11/2014   CREATININE 0.88 07/11/2014   BUN 15 07/11/2014   CO2 23 07/11/2014   TSH 2.580 07/11/2014    Patient was never admitted.  Assessment & Plan:   Hero was seen today for numbness and tingling,  hyperlipidemia, migraine and gastrophageal reflux.  Diagnoses and all orders for this visit:  Restless leg syndrome, uncontrolled  Paresthesia -     Vitamin B12 -     Folate -     Vitamin B6 -     Sedimentation rate -     Cancel: SPEP & IFE with QIG -     PE (Rfx IFE), S  Generalized anxiety disorder  Other orders -     rOPINIRole (REQUIP) 2 MG tablet; Take 1 tablet (2 mg total) by mouth at bedtime. -     ALPRAZolam (XANAX) 0.5 MG tablet; Take 1 tablet (0.5 mg total) by mouth 2 (two) times daily.   I have discontinued Amy Gray's rOPINIRole. I am also having her start on rOPINIRole. Additionally, I am having her maintain her Vitamin D3, aspirin, baclofen, atenolol, omeprazole, rizatriptan, mirabegron  ER, traMADol, atorvastatin, estradiol, sertraline, montelukast, VENTOLIN HFA, SYMBICORT, and ALPRAZolam.  Meds ordered this encounter  Medications  . rOPINIRole (REQUIP) 2 MG tablet    Sig: Take 1 tablet (2 mg total) by mouth at bedtime.    Dispense:  30 tablet    Refill:  5  . ALPRAZolam (XANAX) 0.5 MG tablet    Sig: Take 1 tablet (0.5 mg total) by mouth 2 (two) times daily.    Dispense:  60 tablet    Refill:  2     Follow-up: Return in about 3 months (around 01/02/2015) for CPE.  Mechele Claude, M.D.

## 2014-10-05 ENCOUNTER — Encounter: Payer: Self-pay | Admitting: Family Medicine

## 2014-10-05 LAB — VITAMIN B6: Vitamin B6: 11.8 ug/L (ref 2.0–32.8)

## 2014-10-05 LAB — MULTIPLE MYELOMA PANEL, SERUM
ALBUMIN/GLOB SERPL: 1.3 (ref 0.7–1.7)
ALPHA2 GLOB SERPL ELPH-MCNC: 0.7 g/dL (ref 0.4–1.0)
Albumin SerPl Elph-Mcnc: 3.7 g/dL (ref 2.9–4.4)
Alpha 1: 0.2 g/dL (ref 0.0–0.4)
B-GLOBULIN SERPL ELPH-MCNC: 1.1 g/dL (ref 0.7–1.3)
GAMMA GLOB SERPL ELPH-MCNC: 0.8 g/dL (ref 0.4–1.8)
GLOBULIN, TOTAL: 2.9 g/dL (ref 2.2–3.9)
IGG (IMMUNOGLOBIN G), SERUM: 842 mg/dL (ref 700–1600)
IGM (IMMUNOGLOBULIN M), SRM: 78 mg/dL (ref 26–217)
IgA/Immunoglobulin A, Serum: 219 mg/dL (ref 87–352)
Total Protein: 6.6 g/dL (ref 6.0–8.5)

## 2014-10-05 LAB — VITAMIN B12: VITAMIN B 12: 1958 pg/mL — AB (ref 211–946)

## 2014-10-05 LAB — SEDIMENTATION RATE: Sed Rate: 10 mm/hr (ref 0–40)

## 2014-10-05 LAB — FOLATE: Folate: 15.4 ng/mL (ref >3.0)

## 2014-10-09 ENCOUNTER — Telehealth: Payer: Self-pay | Admitting: Family Medicine

## 2014-10-09 NOTE — Telephone Encounter (Signed)
Reviewed patient's chart. She has been communicating with Dr Darlyn Read through My Chart Messages. She sent a message to him about this problem on 10/06/14 and he responded to try Requip 1.5mg  for two nights and see how it helps.  She hasn't done this so I asked her to give it a try and let us know if she sees an improvement or not.  She agreed to plan.

## 2014-10-16 ENCOUNTER — Ambulatory Visit: Payer: Managed Care, Other (non HMO) | Admitting: Family Medicine

## 2014-10-23 ENCOUNTER — Encounter: Payer: Self-pay | Admitting: Family Medicine

## 2014-10-23 ENCOUNTER — Ambulatory Visit (INDEPENDENT_AMBULATORY_CARE_PROVIDER_SITE_OTHER): Payer: Managed Care, Other (non HMO) | Admitting: Family Medicine

## 2014-10-23 VITALS — BP 97/66 | HR 55 | Temp 97.4°F | Ht 62.0 in | Wt 176.8 lb

## 2014-10-23 DIAGNOSIS — J4531 Mild persistent asthma with (acute) exacerbation: Secondary | ICD-10-CM | POA: Diagnosis not present

## 2014-10-23 MED ORDER — ALBUTEROL SULFATE (2.5 MG/3ML) 0.083% IN NEBU: 2.5000 mg | INHALATION_SOLUTION | RESPIRATORY_TRACT | Status: DC | PRN

## 2014-10-23 MED ORDER — AZITHROMYCIN 250 MG PO TABS: ORAL_TABLET | ORAL | Status: DC

## 2014-10-23 NOTE — Progress Notes (Signed)
BP 97/66 mmHg  Pulse 55  Temp(Src) 97.4 F (36.3 C) (Oral)  Ht '5\' 2"'  (1.575 m)  Wt 176 lb 12.8 oz (80.196 kg)  BMI 32.33 kg/m2   Subjective:    Patient ID: Amy Gray, female    DOB: [birth year], 54 y.o.   MRN: [id number]  HPI: Amy Gray is a 53 y.o. female presenting on 10/23/2014 for Cough; Sinusitis; Wheezing; and Medication Refill   HPI Wheeze and sore throat Patient presents today with a 9 day course of coughing, sore throat, runny nose, sinus pressure. Over the past couple days she has started developing some wheezing that is especially worse at night. She denies fevers or chills or problems with the ears. She does admit to having a lot of postnasal drainage. She is using her Singulair and Flonase and albuterol inhaler and steroid inhaler. She just started using her Flonase a day and a half ago though and feels like it might avoided too long to start. Denies any shortness of breath. She admits to having coughing spells especially at night. She is not coughing anything up is dry and nonproductive. She just had multiple steroid injections in her head and scalp for her migraines 3 days ago. We do not know the dose of the steroids but they told her that she would not be able to take oral steroids because of the injections.  Relevant past medical, surgical, family and social history reviewed and updated as indicated. Interim medical history since our last visit reviewed. Allergies and medications reviewed and updated.  Review of Systems  Constitutional: Negative for fever and chills.  HENT: Positive for congestion, postnasal drip, rhinorrhea, sinus pressure, sneezing, sore throat and voice change. Negative for ear discharge and ear pain.   Eyes: Negative for pain, redness and visual disturbance.  Respiratory: Positive for cough, chest tightness and wheezing. Negative for shortness of breath.   Cardiovascular: Negative for chest pain, palpitations and leg swelling.    Genitourinary: Negative for dysuria and difficulty urinating.  Musculoskeletal: Negative for back pain and gait problem.  Skin: Negative for rash.  Neurological: Negative for light-headedness and headaches.  Psychiatric/Behavioral: Negative for behavioral problems and agitation.  All other systems reviewed and are negative.   Per HPI unless specifically indicated above     Medication List       This list is accurate as of: 10/23/14 11:33 AM.  Always use your most recent med list.               ALPRAZolam 0.5 MG tablet  Commonly known as:  XANAX  Take 1 tablet (0.5 mg total) by mouth 2 (two) times daily.     aspirin 325 MG EC tablet  Take 325 mg by mouth daily.     atenolol 25 MG tablet  Commonly known as:  TENORMIN  Take 1 tablet by mouth daily.     atorvastatin 40 MG tablet  Commonly known as:  LIPITOR  TAKE 1 TABLET BY MOUTH ONCE DAILY FOR CHOLESTEROL     azithromycin 250 MG tablet  Commonly known as:  ZITHROMAX  Take 2 the first day and then one each day after.     baclofen 10 MG tablet  Commonly known as:  LIORESAL  Take 1 tablet by mouth 3 (three) times daily. As needed     estradiol 0.5 MG tablet  Commonly known as:  ESTRACE  TAKE 1 TABLET BY MOUTH DAILY     MAXALT 10 MG tablet  Generic drug:  rizatriptan  Take 10 mg by mouth.     mirabegron ER 50 MG Tb24 tablet  Commonly known as:  MYRBETRIQ  Take 1 tablet (50 mg total) by mouth daily.     montelukast 10 MG tablet  Commonly known as:  SINGULAIR  TAKE 1 TABLET BY MOUTH AT BEDTIME     omeprazole 40 MG capsule  Commonly known as:  PRILOSEC  Take 40 mg by mouth daily.     rOPINIRole 2 MG tablet  Commonly known as:  REQUIP  Take 1 tablet (2 mg total) by mouth at bedtime.     sertraline 100 MG tablet  Commonly known as:  ZOLOFT  TAKE 1 TABLET BY MOUTH DAILY     SYMBICORT 160-4.5 MCG/ACT inhaler  Generic drug:  budesonide-formoterol  INHALE 2 PUFFS INTO THE LUNGS TWICE DAILY     traMADol 50  MG tablet  Commonly known as:  ULTRAM  Take 1 tablet (50 mg total) by mouth every 6 (six) hours as needed.     VENTOLIN HFA 108 (90 BASE) MCG/ACT inhaler  Generic drug:  albuterol  INHALE 2 PUFFS INTO THE LUNGS FOUR TIMES A DAY AS NEEDED FOR BREATHING     albuterol (2.5 MG/3ML) 0.083% nebulizer solution  Commonly known as:  PROVENTIL  Take 3 mLs (2.5 mg total) by nebulization every 4 (four) hours as needed for wheezing or shortness of breath.     Vitamin D3 5000 UNITS Tabs  Take 1 tablet by mouth.     Vitamin D 2000 UNITS Caps  Take 2,000 Units by mouth daily.           Objective:    BP 97/66 mmHg  Pulse 55  Temp(Src) 97.4 F (36.3 C) (Oral)  Ht '5\' 2"'  (1.575 m)  Wt 176 lb 12.8 oz (80.196 kg)  BMI 32.33 kg/m2  Wt Readings from Last 3 Encounters:  10/23/14 176 lb 12.8 oz (80.196 kg)  10/03/14 178 lb 9.6 oz (81.012 kg)  07/14/14 172 lb 12.8 oz (78.382 kg)    Physical Exam  Constitutional: She is oriented to person, place, and time. She appears well-developed and well-nourished. No distress.  HENT:  Right Ear: Tympanic membrane, external ear and ear canal normal.  Left Ear: Tympanic membrane, external ear and ear canal normal.  Nose: Mucosal edema and rhinorrhea present. No epistaxis. Right sinus exhibits no maxillary sinus tenderness and no frontal sinus tenderness. Left sinus exhibits no maxillary sinus tenderness and no frontal sinus tenderness.  Mouth/Throat: Uvula is midline and mucous membranes are normal. Posterior oropharyngeal edema and posterior oropharyngeal erythema present. No oropharyngeal exudate or tonsillar abscesses.  Eyes: Conjunctivae and EOM are normal. Right eye exhibits no discharge. Left eye exhibits no discharge.  Neck: Neck supple. No thyromegaly present.  Cardiovascular: Normal rate, regular rhythm, normal heart sounds and intact distal pulses.   No murmur heard. Pulmonary/Chest: Effort normal. No respiratory distress. She has wheezes (end  expiratory wheeze that is faint in the upper lobes). She has no rales.  Musculoskeletal: Normal range of motion. She exhibits no edema or tenderness.  Lymphadenopathy:    She has no cervical adenopathy.  Neurological: She is alert and oriented to person, place, and time. Coordination normal.  Skin: Skin is warm and dry. No rash noted. She is not diaphoretic.  Psychiatric: She has a normal mood and affect. Her behavior is normal.  Vitals reviewed.   Results for orders placed or performed in visit on 10/03/14  Vitamin B12  Result Value Ref Range   Vitamin B-12 1958 (H) 211 - 946 pg/mL  Folate  Result Value Ref Range   Folate 15.4 >3.0 ng/mL  Vitamin B6  Result Value Ref Range   Vitamin B6 11.8 2.0 - 32.8 ug/L  Sedimentation rate  Result Value Ref Range   Sed Rate 10 0 - 40 mm/hr  Multiple myeloma panel, serum  Result Value Ref Range   IgG (Immunoglobin G), Serum 842 700 - 1600 mg/dL   IgA/Immunoglobulin A, Serum 219 87 - 352 mg/dL   IgM (Immunoglobin M), Srm 78 26 - 217 mg/dL   Total Protein 6.6 6.0 - 8.5 g/dL   Albumin SerPl Elph-Mcnc 3.7 2.9 - 4.4 g/dL   Alpha 1 0.2 0.0 - 0.4 g/dL   Alpha2 Glob SerPl Elph-Mcnc 0.7 0.4 - 1.0 g/dL   B-Globulin SerPl Elph-Mcnc 1.1 0.7 - 1.3 g/dL   Gamma Glob SerPl Elph-Mcnc 0.8 0.4 - 1.8 g/dL   M Protein SerPl Elph-Mcnc Not Observed Not Observed g/dL   Globulin, Total 2.9 2.2 - 3.9 g/dL   Albumin/Glob SerPl 1.3 0.7 - 1.7   IFE 1 Comment    Please Note Comment       Assessment & Plan:       Problem List Items Addressed This Visit    None    Visit Diagnoses    Asthma with exacerbation, mild persistent    -  Primary    just had steroid injection, so cant do oral steroids, will give azithro    Relevant Medications    albuterol (PROVENTIL) (2.5 MG/3ML) 0.083% nebulizer solution        Follow up plan: Return in about 2 months (around 12/23/2014), or if symptoms worsen or fail to improve, for physical.  Caryl Pina, MD [facility] 10/23/2014, 11:33 AM

## 2014-10-27 ENCOUNTER — Other Ambulatory Visit: Payer: Self-pay | Admitting: Family Medicine

## 2014-11-27 ENCOUNTER — Other Ambulatory Visit: Payer: Self-pay | Admitting: Family Medicine

## 2014-12-15 ENCOUNTER — Other Ambulatory Visit: Payer: Self-pay | Admitting: Family Medicine

## 2014-12-19 ENCOUNTER — Other Ambulatory Visit: Payer: Self-pay | Admitting: Family Medicine

## 2014-12-21 ENCOUNTER — Ambulatory Visit (INDEPENDENT_AMBULATORY_CARE_PROVIDER_SITE_OTHER): Payer: Managed Care, Other (non HMO) | Admitting: Family Medicine

## 2014-12-21 ENCOUNTER — Encounter: Payer: Self-pay | Admitting: Family Medicine

## 2014-12-21 ENCOUNTER — Other Ambulatory Visit: Payer: Self-pay | Admitting: Family Medicine

## 2014-12-21 VITALS — BP 110/78 | HR 73 | Temp 97.3°F | Ht 62.0 in | Wt 179.0 lb

## 2014-12-21 DIAGNOSIS — M797 Fibromyalgia: Secondary | ICD-10-CM

## 2014-12-21 DIAGNOSIS — J4530 Mild persistent asthma, uncomplicated: Secondary | ICD-10-CM

## 2014-12-21 DIAGNOSIS — Z23 Encounter for immunization: Secondary | ICD-10-CM | POA: Diagnosis not present

## 2014-12-21 DIAGNOSIS — E785 Hyperlipidemia, unspecified: Secondary | ICD-10-CM

## 2014-12-21 DIAGNOSIS — F411 Generalized anxiety disorder: Secondary | ICD-10-CM

## 2014-12-21 DIAGNOSIS — Z0189 Encounter for other specified special examinations: Secondary | ICD-10-CM

## 2014-12-21 DIAGNOSIS — J4540 Moderate persistent asthma, uncomplicated: Secondary | ICD-10-CM | POA: Diagnosis not present

## 2014-12-21 DIAGNOSIS — Z0000 Encounter for general adult medical examination without abnormal findings: Secondary | ICD-10-CM | POA: Diagnosis not present

## 2014-12-21 DIAGNOSIS — G9520 Unspecified cord compression: Secondary | ICD-10-CM

## 2014-12-21 DIAGNOSIS — E559 Vitamin D deficiency, unspecified: Secondary | ICD-10-CM

## 2014-12-21 DIAGNOSIS — G43709 Chronic migraine without aura, not intractable, without status migrainosus: Secondary | ICD-10-CM

## 2014-12-21 DIAGNOSIS — G2581 Restless legs syndrome: Secondary | ICD-10-CM

## 2014-12-21 LAB — POCT URINALYSIS DIPSTICK
Bilirubin, UA: NEGATIVE
GLUCOSE UA: NEGATIVE
KETONES UA: NEGATIVE
Leukocytes, UA: NEGATIVE
Nitrite, UA: NEGATIVE
PROTEIN UA: NEGATIVE
RBC UA: NEGATIVE
SPEC GRAV UA: 1.025
Urobilinogen, UA: NEGATIVE
pH, UA: 5

## 2014-12-21 MED ORDER — TRAMADOL HCL 50 MG PO TABS: 50.0000 mg | ORAL_TABLET | Freq: Four times a day (QID) | ORAL | Status: DC | PRN

## 2014-12-21 MED ORDER — SERTRALINE HCL 100 MG PO TABS: 100.0000 mg | ORAL_TABLET | Freq: Every day | ORAL | Status: DC

## 2014-12-21 MED ORDER — ESTRADIOL 0.5 MG PO TABS: 0.5000 mg | ORAL_TABLET | Freq: Every day | ORAL | Status: DC

## 2014-12-21 MED ORDER — MIRABEGRON ER 50 MG PO TB24: 50.0000 mg | ORAL_TABLET | Freq: Every day | ORAL | Status: DC

## 2014-12-21 MED ORDER — ALPRAZOLAM 0.5 MG PO TABS: 0.5000 mg | ORAL_TABLET | Freq: Two times a day (BID) | ORAL | Status: DC

## 2014-12-21 MED ORDER — OMEPRAZOLE 40 MG PO CPDR: 40.0000 mg | DELAYED_RELEASE_CAPSULE | Freq: Every day | ORAL | Status: DC

## 2014-12-21 MED ORDER — BUDESONIDE-FORMOTEROL FUMARATE 160-4.5 MCG/ACT IN AERO: INHALATION_SPRAY | RESPIRATORY_TRACT | Status: DC

## 2014-12-21 NOTE — Progress Notes (Signed)
Subjective:  Patient ID: Amy Gray, female    DOB: 1960/04/15  Age: 54 y.o. MRN: [id number]  CC: Annual Exam   HPI Amy Gray presents for CPE. Arthritis flare tx with meloxicam, by Dr. Barkley Boards. Sprained foot. Wearing cast boot. Twisted foot in uneven dip in ground. Occurred 1 month longer. Tx at [facility]. IMproving. Still swelling. Now off B12.   [name] has a past medical history of Migraines; Asthma; Hyperlipidemia; GERD (gastroesophageal reflux disease); Depression; Anxiety; and Osteoarthritis.   She has past surgical history that includes Cervical fusion (2015); Rhinoplasty; and Abdominal hysterectomy.   Her family history includes Arthritis in her father; Asthma in her mother; Heart disease in her father and mother; Hypertension in her sister.She reports that she has never smoked. She does not have any smokeless tobacco history on file. She reports that she drinks alcohol. She reports that she does not use illicit drugs.  Outpatient Prescriptions Prior to Visit  Medication Sig Dispense Refill  . albuterol (PROVENTIL) (2.5 MG/3ML) 0.083% nebulizer solution Take 3 mLs (2.5 mg total) by nebulization every 4 (four) hours as needed for wheezing or shortness of breath. 75 mL 3  . atenolol (TENORMIN) 25 MG tablet Take 1 tablet by mouth daily.     Marland Kitchen atorvastatin (LIPITOR) 40 MG tablet TAKE 1 TABLET BY MOUTH ONCE DAILY FOR CHOLESTEROL 30 tablet 0  . baclofen (LIORESAL) 10 MG tablet Take 1 tablet by mouth 3 (three) times daily. As needed    . Cholecalciferol (VITAMIN D) 2000 UNITS CAPS Take 2,000 Units by mouth daily.    . montelukast (SINGULAIR) 10 MG tablet TAKE 1 TABLET BY MOUTH AT BEDTIME 30 tablet 5  . rizatriptan (MAXALT) 10 MG tablet Take 10 mg by mouth.    Marland Kitchen rOPINIRole (REQUIP) 2 MG tablet Take 1 tablet (2 mg total) by mouth at bedtime. 30 tablet 5  . VENTOLIN HFA 108 (90 BASE) MCG/ACT inhaler INHALE 2 PUFFS INTO THE LUNGS FOUR TIMES A DAY AS NEEDED FOR  BREATHING 18 g 4  . ALPRAZolam (XANAX) 0.5 MG tablet Take 1 tablet (0.5 mg total) by mouth 2 (two) times daily. 60 tablet 2  . estradiol (ESTRACE) 0.5 MG tablet TAKE 1 TABLET BY MOUTH DAILY 30 tablet 4  . MYRBETRIQ 50 MG TB24 tablet Take 1 tablet by mouth daily. 30 tablet 4  . omeprazole (PRILOSEC) 40 MG capsule Take 40 mg by mouth daily.    . sertraline (ZOLOFT) 100 MG tablet TAKE 1 TABLET BY MOUTH DAILY 30 tablet 2  . SYMBICORT 160-4.5 MCG/ACT inhaler INHALE 2 PUFFS INTO THE LUNGS TWICE DAILY 10.2 g 2  . traMADol (ULTRAM) 50 MG tablet Take 1 tablet (50 mg total) by mouth every 6 (six) hours as needed. 50 tablet 2  . aspirin 325 MG EC tablet Take 325 mg by mouth daily.    Marland Kitchen azithromycin (ZITHROMAX) 250 MG tablet Take 2 the first day and then one each day after. 6 tablet 0  . Cholecalciferol (VITAMIN D3) 5000 UNITS TABS Take 1 tablet by mouth.     No facility-administered medications prior to visit.    ROS Review of Systems  Constitutional: Negative for fever, chills, diaphoresis, appetite change, fatigue and unexpected weight change.  HENT: Negative for congestion, ear pain, hearing loss, postnasal drip, rhinorrhea, sneezing, sore throat and trouble swallowing.   Eyes: Negative for pain.  Respiratory: Negative for cough, chest tightness and shortness of breath.   Cardiovascular: Negative for chest pain and palpitations.  Gastrointestinal: Negative for nausea, vomiting, abdominal pain, diarrhea and constipation.  Endocrine: Negative for cold intolerance, heat intolerance, polydipsia, polyphagia and polyuria.  Genitourinary: Negative for dysuria, frequency and menstrual problem.  Musculoskeletal: Negative for joint swelling and arthralgias.  Skin: Negative for rash.  Allergic/Immunologic: Negative for environmental allergies.  Neurological: Negative for dizziness, weakness, numbness and headaches.  Psychiatric/Behavioral: Negative for dysphoric mood and agitation.    Objective:  BP  110/78 mmHg  Pulse 73  Temp(Src) 97.3 F (36.3 C) (Oral)  Ht '5\' 2"'  (1.575 m)  Wt 179 lb (81.194 kg)  BMI 32.73 kg/m2  BP Readings from Last 3 Encounters:  12/21/14 110/78  10/23/14 97/66  10/03/14 98/66    Wt Readings from Last 3 Encounters:  12/21/14 179 lb (81.194 kg)  10/23/14 176 lb 12.8 oz (80.196 kg)  10/03/14 178 lb 9.6 oz (81.012 kg)     Physical Exam  Constitutional: She is oriented to person, place, and time. She appears well-developed and well-nourished. No distress.  HENT:  Head: Normocephalic and atraumatic.  Right Ear: External ear normal.  Left Ear: External ear normal.  Nose: Nose normal.  Mouth/Throat: Oropharynx is clear and moist.  Eyes: Conjunctivae and EOM are normal. Pupils are equal, round, and reactive to light.  Neck: Normal range of motion. Neck supple. No thyromegaly present.  Cardiovascular: Normal rate, regular rhythm and normal heart sounds.   No murmur heard. Pulmonary/Chest: Effort normal and breath sounds normal. No respiratory distress. She has no wheezes. She has no rales. Right breast exhibits no inverted nipple, no mass and no tenderness. Left breast exhibits no inverted nipple, no mass and no tenderness. Breasts are symmetrical.  Abdominal: Soft. Normal appearance and bowel sounds are normal. She exhibits no distension, no abdominal bruit and no mass. There is no splenomegaly or hepatomegaly. There is no tenderness. There is no tenderness at McBurney's point and negative Murphy's sign.  Genitourinary: Rectum normal. Rectal exam shows no external hemorrhoid, no internal hemorrhoid, no mass and no tenderness.  Musculoskeletal: Normal range of motion. She exhibits no edema or tenderness.  Lymphadenopathy:    She has no cervical adenopathy.  Neurological: She is alert and oriented to person, place, and time. She has normal reflexes.  Skin: Skin is warm and dry. No rash noted.  Psychiatric: She has a normal mood and affect. Her behavior is  normal. Judgment and thought content normal.    No results found for: HGBA1C  Lab Results  Component Value Date   WBC 5.7 12/21/2014   HGB 12.9 07/11/2014   HCT 40.1 12/21/2014   GLUCOSE 86 12/21/2014   CHOL 178 12/21/2014   TRIG 85 12/21/2014   HDL 71 12/21/2014   ALT 23 12/21/2014   AST 23 12/21/2014   NA 141 12/21/2014   K 4.1 12/21/2014   CL 102 12/21/2014   CREATININE 0.87 12/21/2014   BUN 17 12/21/2014   CO2 24 12/21/2014   TSH 2.580 07/11/2014    Patient was never admitted.  Assessment & Plan:   Hatley was seen today for annual exam.  Diagnoses and all orders for this visit:  Wellness examination -     CBC with Differential/Platelet -     CMP14+EGFR -     POCT urinalysis dipstick  Asthma, chronic, mild persistent, uncomplicated -     PR BREATHING CAPACITY TEST -     CBC with Differential/Platelet -     CMP14+EGFR -     POCT urinalysis dipstick  Restless leg  syndrome, uncontrolled -     CBC with Differential/Platelet -     CMP14+EGFR -     POCT urinalysis dipstick  Fibromyalgia syndrome -     CBC with Differential/Platelet -     CMP14+EGFR -     POCT urinalysis dipstick  Chronic migraine without aura without status migrainosus, not intractable -     CBC with Differential/Platelet -     CMP14+EGFR -     POCT urinalysis dipstick  Hyperlipidemia -     NMR, lipoprofile -     POCT urinalysis dipstick  Vitamin D deficiency -     VITAMIN D 25 Hydroxy (Vit-D Deficiency, Fractures)  Airway hyperreactivity, moderate persistent, uncomplicated  Cervical spinal cord compression (HCC)  Generalized anxiety disorder  HLD (hyperlipidemia)  Other orders -     ALPRAZolam (XANAX) 0.5 MG tablet; Take 1 tablet (0.5 mg total) by mouth 2 (two) times daily. -     estradiol (ESTRACE) 0.5 MG tablet; Take 1 tablet (0.5 mg total) by mouth daily. -     omeprazole (PRILOSEC) 40 MG capsule; Take 1 capsule (40 mg total) by mouth daily. -     mirabegron ER  (MYRBETRIQ) 50 MG TB24 tablet; Take 1 tablet (50 mg total) by mouth daily. -     sertraline (ZOLOFT) 100 MG tablet; Take 1 tablet (100 mg total) by mouth daily. -     budesonide-formoterol (SYMBICORT) 160-4.5 MCG/ACT inhaler; INHALE 2 PUFFS INTO THE LUNGS TWICE DAILY -     traMADol (ULTRAM) 50 MG tablet; Take 1 tablet (50 mg total) by mouth every 6 (six) hours as needed.   I have discontinued Ms. Bergeron's Vitamin D3, aspirin, azithromycin, and promethazine. I have changed her MYRBETRIQ to mirabegron ER and SYMBICORT to budesonide-formoterol. I have also changed her estradiol, omeprazole, and sertraline. Additionally, I am having her maintain her baclofen, atenolol, rizatriptan, montelukast, rOPINIRole, Vitamin D, albuterol, atorvastatin, VENTOLIN HFA, meloxicam, ondansetron, ALPRAZolam, and traMADol.  Meds ordered this encounter  Medications  . DISCONTD: promethazine (PHENERGAN) 12.5 MG tablet    Sig: Take 1 or 2 tabs as needed for nausea up to three times a day  . meloxicam (MOBIC) 15 MG tablet    Sig: Take 15 mg by mouth daily.  . ondansetron (ZOFRAN) 8 MG tablet    Sig:   . ALPRAZolam (XANAX) 0.5 MG tablet    Sig: Take 1 tablet (0.5 mg total) by mouth 2 (two) times daily.    Dispense:  60 tablet    Refill:  5  . estradiol (ESTRACE) 0.5 MG tablet    Sig: Take 1 tablet (0.5 mg total) by mouth daily.    Dispense:  30 tablet    Refill:  5  . omeprazole (PRILOSEC) 40 MG capsule    Sig: Take 1 capsule (40 mg total) by mouth daily.    Dispense:  30 capsule    Refill:  11  . mirabegron ER (MYRBETRIQ) 50 MG TB24 tablet    Sig: Take 1 tablet (50 mg total) by mouth daily.    Dispense:  30 tablet    Refill:  5  . sertraline (ZOLOFT) 100 MG tablet    Sig: Take 1 tablet (100 mg total) by mouth daily.    Dispense:  30 tablet    Refill:  5  . budesonide-formoterol (SYMBICORT) 160-4.5 MCG/ACT inhaler    Sig: INHALE 2 PUFFS INTO THE LUNGS TWICE DAILY    Dispense:  10.2 g    Refill:  11  .  traMADol (ULTRAM) 50 MG tablet    Sig: Take 1 tablet (50 mg total) by mouth every 6 (six) hours as needed.    Dispense:  50 tablet    Refill:  5     Follow-up: Return in about 3 months (around 03/21/2015).  Claretta Fraise, M.D.

## 2014-12-21 NOTE — Patient Instructions (Signed)
Prevnar today (pneumonia shot)

## 2014-12-22 LAB — CBC WITH DIFFERENTIAL/PLATELET
BASOS ABS: 0 10*3/uL (ref 0.0–0.2)
Basos: 1 %
EOS (ABSOLUTE): 0.1 10*3/uL (ref 0.0–0.4)
Eos: 2 %
Hematocrit: 40.1 % (ref 34.0–46.6)
Hemoglobin: 13.3 g/dL (ref 11.1–15.9)
Immature Grans (Abs): 0 10*3/uL (ref 0.0–0.1)
Immature Granulocytes: 0 %
LYMPHS ABS: 2.1 10*3/uL (ref 0.7–3.1)
Lymphs: 37 %
MCH: 29 pg (ref 26.6–33.0)
MCHC: 33.2 g/dL (ref 31.5–35.7)
MCV: 88 fL (ref 79–97)
MONOCYTES: 10 %
MONOS ABS: 0.6 10*3/uL (ref 0.1–0.9)
Neutrophils Absolute: 2.9 10*3/uL (ref 1.4–7.0)
Neutrophils: 50 %
PLATELETS: 262 10*3/uL (ref 150–379)
RBC: 4.58 x10E6/uL (ref 3.77–5.28)
RDW: 14.8 % (ref 12.3–15.4)
WBC: 5.7 10*3/uL (ref 3.4–10.8)

## 2014-12-22 LAB — CMP14+EGFR
ALK PHOS: 123 IU/L — AB (ref 39–117)
ALT: 23 IU/L (ref 0–32)
AST: 23 IU/L (ref 0–40)
Albumin/Globulin Ratio: 1.8 (ref 1.1–2.5)
Albumin: 4.4 g/dL (ref 3.5–5.5)
BUN/Creatinine Ratio: 20 (ref 9–23)
BUN: 17 mg/dL (ref 6–24)
Bilirubin Total: 0.3 mg/dL (ref 0.0–1.2)
CHLORIDE: 102 mmol/L (ref 97–106)
CO2: 24 mmol/L (ref 18–29)
CREATININE: 0.87 mg/dL (ref 0.57–1.00)
Calcium: 9.3 mg/dL (ref 8.7–10.2)
GFR calc Af Amer: 87 mL/min/{1.73_m2} (ref >59)
GFR calc non Af Amer: 76 mL/min/{1.73_m2} (ref >59)
GLUCOSE: 86 mg/dL (ref 65–99)
Globulin, Total: 2.5 g/dL (ref 1.5–4.5)
Potassium: 4.1 mmol/L (ref 3.5–5.2)
Sodium: 141 mmol/L (ref 136–144)
Total Protein: 6.9 g/dL (ref 6.0–8.5)

## 2014-12-22 LAB — NMR, LIPOPROFILE
Cholesterol: 178 mg/dL (ref 100–199)
HDL CHOLESTEROL BY NMR: 71 mg/dL (ref >39)
HDL PARTICLE NUMBER: 32.8 umol/L (ref >=30.5)
LDL Particle Number: 849 nmol/L (ref <1000)
LDL Size: 21.3 nm (ref >20.5)
LDL-C: 90 mg/dL (ref 0–99)
LP-IR Score: <25 (ref <=45)
Small LDL Particle Number: 145 nmol/L (ref <=527)
Triglycerides by NMR: 85 mg/dL (ref 0–149)

## 2014-12-22 LAB — VITAMIN D 25 HYDROXY (VIT D DEFICIENCY, FRACTURES): VIT D 25 HYDROXY: 41.9 ng/mL (ref 30.0–100.0)

## 2014-12-24 DIAGNOSIS — Z0000 Encounter for general adult medical examination without abnormal findings: Secondary | ICD-10-CM | POA: Insufficient documentation

## 2014-12-26 NOTE — Addendum Note (Signed)
Addended by: Margurite AuerbachOMPTON, KARLA G on: 12/26/2014 11:47 AM   Modules accepted: Orders

## 2015-01-18 ENCOUNTER — Other Ambulatory Visit: Payer: Self-pay | Admitting: Family Medicine

## 2015-04-03 ENCOUNTER — Ambulatory Visit: Payer: Managed Care, Other (non HMO) | Admitting: Family Medicine

## 2015-04-03 ENCOUNTER — Other Ambulatory Visit: Payer: Self-pay | Admitting: Family Medicine

## 2015-04-23 ENCOUNTER — Ambulatory Visit (INDEPENDENT_AMBULATORY_CARE_PROVIDER_SITE_OTHER): Payer: Managed Care, Other (non HMO) | Admitting: Family Medicine

## 2015-04-23 ENCOUNTER — Encounter: Payer: Self-pay | Admitting: Family Medicine

## 2015-04-23 DIAGNOSIS — F411 Generalized anxiety disorder: Secondary | ICD-10-CM | POA: Diagnosis not present

## 2015-04-23 DIAGNOSIS — Z139 Encounter for screening, unspecified: Secondary | ICD-10-CM

## 2015-04-23 DIAGNOSIS — G2581 Restless legs syndrome: Secondary | ICD-10-CM | POA: Diagnosis not present

## 2015-04-23 DIAGNOSIS — M797 Fibromyalgia: Secondary | ICD-10-CM | POA: Diagnosis not present

## 2015-04-23 NOTE — Progress Notes (Signed)
Subjective:  Patient ID: Amy Gray, female    DOB: 1960-03-21  Age: 55 y.o. MRN: [id number]  CC: migraines; Back Pain; and RLS   HPI Amy Gray presents for Can barely stand due to balance problems. MRI brain done showed nml. EMG showed problem in lower back. Weak in both legs. Pins & needles in the right leg and arm.    History Amy Gray has a past medical history of Migraines; Asthma; Hyperlipidemia; GERD (gastroesophageal reflux disease); Depression; Anxiety; and Osteoarthritis.   She has past surgical history that includes Cervical fusion (2015); Rhinoplasty; and Abdominal hysterectomy.   Her family history includes Arthritis in her father; Asthma in her mother; Heart disease in her father and mother; Hypertension in her sister.She reports that she has never smoked. She does not have any smokeless tobacco history on file. She reports that she drinks alcohol. She reports that she does not use illicit drugs.    ROS Review of Systems  Constitutional: Negative for fever, activity change and appetite change.  HENT: Negative for congestion, rhinorrhea and sore throat.   Eyes: Negative for visual disturbance.  Respiratory: Negative for cough and shortness of breath.   Cardiovascular: Negative for chest pain and palpitations.  Gastrointestinal: Negative for nausea, abdominal pain and diarrhea.  Genitourinary: Negative for dysuria.  Musculoskeletal: Negative for myalgias and arthralgias.  Neurological: Positive for weakness and headaches. Negative for tremors and light-headedness.  Psychiatric/Behavioral: The patient is not nervous/anxious.     Objective:  There were no vitals taken for this visit.  BP Readings from Last 3 Encounters:  12/21/14 110/78  10/23/14 97/66  10/03/14 98/66    Wt Readings from Last 3 Encounters:  12/21/14 179 lb (81.194 kg)  10/23/14 176 lb 12.8 oz (80.196 kg)  10/03/14 178 lb 9.6 oz (81.012 kg)     Physical Exam  Constitutional: She  is oriented to person, place, and time. She appears well-developed and well-nourished. No distress.  HENT:  Head: Normocephalic and atraumatic.  Eyes: Conjunctivae are normal. Pupils are equal, round, and reactive to light.  Neck: Normal range of motion. Neck supple. No thyromegaly present.  Cardiovascular: Normal rate, regular rhythm and normal heart sounds.   No murmur heard. Pulmonary/Chest: Effort normal and breath sounds normal. No respiratory distress. She has no wheezes. She has no rales.  Abdominal: Soft. Bowel sounds are normal. She exhibits no distension. There is no tenderness.  Musculoskeletal: Normal range of motion.  Lymphadenopathy:    She has no cervical adenopathy.  Neurological: She is alert and oriented to person, place, and time. She displays normal reflexes. No cranial nerve deficit. She exhibits normal muscle tone. Coordination normal.  Skin: Skin is warm and dry.  Psychiatric: She has a normal mood and affect. Her behavior is normal. Judgment and thought content normal.     Lab Results  Component Value Date   WBC 5.7 12/21/2014   HGB 12.9 07/11/2014   HCT 40.1 12/21/2014   PLT 262 12/21/2014   GLUCOSE 86 12/21/2014   CHOL 178 12/21/2014   TRIG 85 12/21/2014   HDL 71 12/21/2014   ALT 23 12/21/2014   AST 23 12/21/2014   NA 141 12/21/2014   K 4.1 12/21/2014   CL 102 12/21/2014   CREATININE 0.87 12/21/2014   BUN 17 12/21/2014   CO2 24 12/21/2014   TSH 2.580 07/11/2014    Patient was never admitted.  Assessment & Plan:   Amy Gray was seen today for migraines, back pain and rls.  Diagnoses and all orders for this visit:  Restless leg syndrome, uncontrolled  Generalized anxiety disorder  Fibromyalgia syndrome -     CBC with Differential/Platelet -     CMP14+EGFR -     Lipid panel -     Vitamin B12 -     Folate   Continue to follow with Neuro. Consider meds as possible source of weaknewss if neuro eval not conclusive.  Meds ordered this  encounter  Medications  . aspirin 325 MG EC tablet    Sig: Take 1 tablet by mouth daily.     Follow-up: Return in about 6 months (around 10/23/2015).  Claretta Fraise, M.D.

## 2015-04-24 LAB — CMP14+EGFR
A/G RATIO: 1.9 (ref 1.2–2.2)
ALBUMIN: 4.4 g/dL (ref 3.5–5.5)
ALT: 26 IU/L (ref 0–32)
AST: 25 IU/L (ref 0–40)
Alkaline Phosphatase: 130 IU/L — ABNORMAL HIGH (ref 39–117)
BILIRUBIN TOTAL: 0.3 mg/dL (ref 0.0–1.2)
BUN / CREAT RATIO: 24 — AB (ref 9–23)
BUN: 20 mg/dL (ref 6–24)
CHLORIDE: 105 mmol/L (ref 96–106)
CO2: 23 mmol/L (ref 18–29)
Calcium: 9.6 mg/dL (ref 8.7–10.2)
Creatinine, Ser: 0.83 mg/dL (ref 0.57–1.00)
GFR calc non Af Amer: 80 mL/min/{1.73_m2} (ref >59)
GFR, EST AFRICAN AMERICAN: 92 mL/min/{1.73_m2} (ref >59)
GLOBULIN, TOTAL: 2.3 g/dL (ref 1.5–4.5)
GLUCOSE: 56 mg/dL — AB (ref 65–99)
Potassium: 4.2 mmol/L (ref 3.5–5.2)
SODIUM: 144 mmol/L (ref 134–144)
TOTAL PROTEIN: 6.7 g/dL (ref 6.0–8.5)

## 2015-04-24 LAB — CBC WITH DIFFERENTIAL/PLATELET
BASOS ABS: 0 10*3/uL (ref 0.0–0.2)
Basos: 1 %
EOS (ABSOLUTE): 0.1 10*3/uL (ref 0.0–0.4)
Eos: 2 %
Hematocrit: 38.7 % (ref 34.0–46.6)
Hemoglobin: 12.8 g/dL (ref 11.1–15.9)
IMMATURE GRANULOCYTES: 0 %
Immature Grans (Abs): 0 10*3/uL (ref 0.0–0.1)
LYMPHS: 37 %
Lymphocytes Absolute: 2.3 10*3/uL (ref 0.7–3.1)
MCH: 29.3 pg (ref 26.6–33.0)
MCHC: 33.1 g/dL (ref 31.5–35.7)
MCV: 89 fL (ref 79–97)
MONOS ABS: 0.6 10*3/uL (ref 0.1–0.9)
Monocytes: 10 %
NEUTROS ABS: 3 10*3/uL (ref 1.4–7.0)
NEUTROS PCT: 50 %
PLATELETS: 270 10*3/uL (ref 150–379)
RBC: 4.37 x10E6/uL (ref 3.77–5.28)
RDW: 14.3 % (ref 12.3–15.4)
WBC: 6.1 10*3/uL (ref 3.4–10.8)

## 2015-04-24 LAB — LIPID PANEL
Chol/HDL Ratio: 2.9 ratio units (ref 0.0–4.4)
Cholesterol, Total: 187 mg/dL (ref 100–199)
HDL: 65 mg/dL (ref >39)
LDL Calculated: 99 mg/dL (ref 0–99)
Triglycerides: 115 mg/dL (ref 0–149)
VLDL CHOLESTEROL CAL: 23 mg/dL (ref 5–40)

## 2015-04-24 LAB — VITAMIN B12: VITAMIN B 12: 685 pg/mL (ref 211–946)

## 2015-04-24 LAB — FOLATE: Folate: >20 ng/mL (ref >3.0)

## 2015-04-25 ENCOUNTER — Encounter: Payer: Self-pay | Admitting: *Deleted

## 2015-04-26 LAB — FECAL OCCULT BLOOD, IMMUNOCHEMICAL: Fecal Occult Bld: NEGATIVE

## 2015-05-21 ENCOUNTER — Other Ambulatory Visit: Payer: Self-pay | Admitting: Family Medicine

## 2015-05-21 ENCOUNTER — Encounter: Payer: Self-pay | Admitting: Family Medicine

## 2015-05-21 NOTE — Telephone Encounter (Signed)
Please review and advise.

## 2015-05-21 NOTE — Telephone Encounter (Signed)
[facility]

## 2015-05-21 NOTE — Telephone Encounter (Signed)
Last seen 04/23/15 Dr Darlyn ReadStacks   If approved [facility]  [phone]

## 2015-05-25 ENCOUNTER — Encounter: Payer: Self-pay | Admitting: Family Medicine

## 2015-05-25 ENCOUNTER — Other Ambulatory Visit: Payer: Self-pay | Admitting: Family Medicine

## 2015-05-25 MED ORDER — TRAMADOL HCL 50 MG PO TABS: 50.0000 mg | ORAL_TABLET | Freq: Four times a day (QID) | ORAL | Status: DC | PRN

## 2015-05-25 MED ORDER — ALPRAZOLAM 0.5 MG PO TABS: 0.5000 mg | ORAL_TABLET | Freq: Two times a day (BID) | ORAL | Status: DC

## 2015-06-22 ENCOUNTER — Other Ambulatory Visit: Payer: Self-pay | Admitting: Family Medicine

## 2015-06-23 ENCOUNTER — Other Ambulatory Visit: Payer: Self-pay | Admitting: Family Medicine

## 2015-06-27 ENCOUNTER — Telehealth: Payer: Self-pay | Admitting: Family Medicine

## 2015-06-27 NOTE — Telephone Encounter (Signed)
Yes, she can take the prednisone. Should not affect bruisuing.

## 2015-06-27 NOTE — Telephone Encounter (Signed)
Pt aware of recommendations

## 2015-07-04 ENCOUNTER — Encounter: Payer: Self-pay | Admitting: Family Medicine

## 2015-07-04 ENCOUNTER — Other Ambulatory Visit: Payer: Self-pay | Admitting: Family Medicine

## 2015-07-04 MED ORDER — CETIRIZINE HCL 10 MG PO TABS: 10.0000 mg | ORAL_TABLET | Freq: Every day | ORAL | Status: DC

## 2015-07-11 ENCOUNTER — Telehealth: Payer: Self-pay | Admitting: Family Medicine

## 2015-07-11 NOTE — Telephone Encounter (Signed)
Patient aware.

## 2015-07-11 NOTE — Telephone Encounter (Signed)
Please call patient with this information

## 2015-07-11 NOTE — Telephone Encounter (Signed)
Have the patient to use nix over-the-counter and follow the directions

## 2015-07-21 ENCOUNTER — Other Ambulatory Visit: Payer: Self-pay | Admitting: Family Medicine

## 2015-07-25 ENCOUNTER — Other Ambulatory Visit: Payer: Self-pay | Admitting: Family Medicine

## 2015-08-20 ENCOUNTER — Other Ambulatory Visit: Payer: Self-pay | Admitting: Family Medicine

## 2015-09-19 ENCOUNTER — Other Ambulatory Visit: Payer: Self-pay | Admitting: Family Medicine

## 2015-09-20 ENCOUNTER — Other Ambulatory Visit: Payer: Self-pay | Admitting: Family Medicine

## 2015-09-21 ENCOUNTER — Other Ambulatory Visit: Payer: Self-pay | Admitting: Family Medicine

## 2015-10-17 ENCOUNTER — Encounter: Payer: Self-pay | Admitting: Family Medicine

## 2015-10-19 ENCOUNTER — Other Ambulatory Visit: Payer: Self-pay | Admitting: Family Medicine

## 2015-10-24 ENCOUNTER — Encounter: Payer: Self-pay | Admitting: Family Medicine

## 2015-10-24 ENCOUNTER — Ambulatory Visit: Payer: Managed Care, Other (non HMO) | Admitting: Family Medicine

## 2015-10-24 ENCOUNTER — Ambulatory Visit (INDEPENDENT_AMBULATORY_CARE_PROVIDER_SITE_OTHER): Payer: Managed Care, Other (non HMO) | Admitting: Family Medicine

## 2015-10-24 VITALS — BP 104/72 | HR 79 | Temp 97.7°F | Ht 62.0 in | Wt 184.0 lb

## 2015-10-24 DIAGNOSIS — G43709 Chronic migraine without aura, not intractable, without status migrainosus: Secondary | ICD-10-CM

## 2015-10-24 DIAGNOSIS — M797 Fibromyalgia: Secondary | ICD-10-CM | POA: Insufficient documentation

## 2015-10-24 DIAGNOSIS — F411 Generalized anxiety disorder: Secondary | ICD-10-CM | POA: Diagnosis not present

## 2015-10-24 DIAGNOSIS — J45909 Unspecified asthma, uncomplicated: Secondary | ICD-10-CM

## 2015-10-24 DIAGNOSIS — E782 Mixed hyperlipidemia: Secondary | ICD-10-CM

## 2015-10-24 DIAGNOSIS — G2581 Restless legs syndrome: Secondary | ICD-10-CM | POA: Diagnosis not present

## 2015-10-24 LAB — CBC WITH DIFFERENTIAL/PLATELET
BASOS ABS: 0 10*3/uL (ref 0.0–0.2)
BASOS: 1 %
EOS (ABSOLUTE): 0.1 10*3/uL (ref 0.0–0.4)
Eos: 2 %
HEMOGLOBIN: 13.8 g/dL (ref 11.1–15.9)
Hematocrit: 40 % (ref 34.0–46.6)
Immature Grans (Abs): 0 10*3/uL (ref 0.0–0.1)
Immature Granulocytes: 0 %
Lymphocytes Absolute: 2.1 10*3/uL (ref 0.7–3.1)
Lymphs: 33 %
MCH: 30.7 pg (ref 26.6–33.0)
MCHC: 34.5 g/dL (ref 31.5–35.7)
MCV: 89 fL (ref 79–97)
MONOCYTES: 11 %
Monocytes Absolute: 0.7 10*3/uL (ref 0.1–0.9)
NEUTROS ABS: 3.4 10*3/uL (ref 1.4–7.0)
Neutrophils: 53 %
PLATELETS: 277 10*3/uL (ref 150–379)
RBC: 4.5 x10E6/uL (ref 3.77–5.28)
RDW: 14.5 % (ref 12.3–15.4)
WBC: 6.4 10*3/uL (ref 3.4–10.8)

## 2015-10-24 LAB — CMP14+EGFR
A/G RATIO: 1.8 (ref 1.2–2.2)
ALBUMIN: 4.3 g/dL (ref 3.5–5.5)
ALK PHOS: 123 IU/L — AB (ref 39–117)
ALT: 40 IU/L — ABNORMAL HIGH (ref 0–32)
AST: 25 IU/L (ref 0–40)
BUN/Creatinine Ratio: 21 (ref 9–23)
BUN: 18 mg/dL (ref 6–24)
Bilirubin Total: 0.3 mg/dL (ref 0.0–1.2)
CALCIUM: 9.6 mg/dL (ref 8.7–10.2)
CHLORIDE: 100 mmol/L (ref 96–106)
CO2: 25 mmol/L (ref 18–29)
Creatinine, Ser: 0.86 mg/dL (ref 0.57–1.00)
GFR calc Af Amer: 88 mL/min/{1.73_m2} (ref >59)
GFR, EST NON AFRICAN AMERICAN: 76 mL/min/{1.73_m2} (ref >59)
GLOBULIN, TOTAL: 2.4 g/dL (ref 1.5–4.5)
Glucose: 58 mg/dL — ABNORMAL LOW (ref 65–99)
POTASSIUM: 4.3 mmol/L (ref 3.5–5.2)
SODIUM: 140 mmol/L (ref 134–144)
Total Protein: 6.7 g/dL (ref 6.0–8.5)

## 2015-10-24 LAB — LIPID PANEL
CHOLESTEROL TOTAL: 227 mg/dL — AB (ref 100–199)
Chol/HDL Ratio: 3.7 ratio units (ref 0.0–4.4)
HDL: 61 mg/dL (ref >39)
LDL CALC: 129 mg/dL — AB (ref 0–99)
Triglycerides: 183 mg/dL — ABNORMAL HIGH (ref 0–149)
VLDL Cholesterol Cal: 37 mg/dL (ref 5–40)

## 2015-10-24 MED ORDER — TRAMADOL HCL 50 MG PO TABS: 50.0000 mg | ORAL_TABLET | Freq: Four times a day (QID) | ORAL | 5 refills | Status: DC | PRN

## 2015-10-24 MED ORDER — ESTRADIOL 0.5 MG PO TABS: 0.5000 mg | ORAL_TABLET | Freq: Every day | ORAL | 5 refills | Status: DC

## 2015-10-24 MED ORDER — ROPINIROLE HCL 2 MG PO TABS: 2.0000 mg | ORAL_TABLET | Freq: Every day | ORAL | 6 refills | Status: DC

## 2015-10-24 MED ORDER — ALPRAZOLAM 0.5 MG PO TABS: 0.5000 mg | ORAL_TABLET | Freq: Two times a day (BID) | ORAL | 5 refills | Status: DC

## 2015-10-24 MED ORDER — ALBUTEROL SULFATE (2.5 MG/3ML) 0.083% IN NEBU: INHALATION_SOLUTION | RESPIRATORY_TRACT | 6 refills | Status: DC

## 2015-10-24 MED ORDER — OMEPRAZOLE 40 MG PO CPDR: 40.0000 mg | DELAYED_RELEASE_CAPSULE | Freq: Every day | ORAL | 11 refills | Status: DC

## 2015-10-24 MED ORDER — MIRABEGRON ER 50 MG PO TB24: 50.0000 mg | ORAL_TABLET | Freq: Every day | ORAL | 5 refills | Status: DC

## 2015-10-24 MED ORDER — MONTELUKAST SODIUM 10 MG PO TABS: 10.0000 mg | ORAL_TABLET | Freq: Every day | ORAL | 10 refills | Status: DC

## 2015-10-24 MED ORDER — BUDESONIDE-FORMOTEROL FUMARATE 160-4.5 MCG/ACT IN AERO: INHALATION_SPRAY | RESPIRATORY_TRACT | 11 refills | Status: DC

## 2015-10-24 NOTE — Progress Notes (Signed)
Subjective:  Patient ID: Amy Gray, female    DOB: [birth year]  Age: 55 y.o. MRN: [id number]  CC: Anxiety (6 month recheck); migraines; restless leg; Medication Refill; and Arthritis   HPI Amy Gray presents for Recheck of her migraines weakness etc. She saw Dr. Linus Salmons who told her that her nerve conduction is normal. He feels that Lipitor is contributing to her leg weakness. Therefore he asked her to discontinue that drug about a week ago. So far there has not been significant improvement. She also is trying a type of special shoe or foot dressing/brace to help with her restless leg. It is now on order. She has been visiting with the headache clinic physician for several months. She is been going weekly. Several adjustments in medication been made to help relieve migraine. She is currently titrating verapamil from 40 mg a day, her current dose, to 80 mg twice daily. GAD 7 : Generalized Anxiety Score 10/24/2015 04/23/2015  Nervous, Anxious, on Edge 0 0  Control/stop worrying 0 0  Worry too much - different things 0 0  Trouble relaxing 3 0  Restless 2 3  Easily annoyed or irritable 0 1  Afraid - awful might happen 0 0  Total GAD 7 Score 5 4  Anxiety Difficulty Not difficult at all Not difficult at all      History Amy Gray has a past medical history of Anxiety; Asthma; Depression; GERD (gastroesophageal reflux disease); Hyperlipidemia; Migraines; and Osteoarthritis.   She has a past surgical history that includes Cervical fusion (2015); Rhinoplasty; and Abdominal hysterectomy.   Her family history includes Arthritis in her father; Asthma in her mother; Heart disease in her father and mother; Hypertension in her sister.She reports that she has never smoked. She does not have any smokeless tobacco history on file. She reports that she drinks alcohol. She reports that she does not use drugs.    ROS Review of Systems  Constitutional: Negative for activity change, appetite  change and fever.  HENT: Negative for congestion, rhinorrhea and sore throat.   Eyes: Negative for visual disturbance.  Respiratory: Negative for cough and shortness of breath.   Cardiovascular: Negative for chest pain and palpitations.  Gastrointestinal: Negative for abdominal pain, diarrhea and nausea.  Genitourinary: Negative for dysuria.  Musculoskeletal: Positive for arthralgias and myalgias.  Neurological: Positive for weakness (BLE), numbness and headaches (daily, partial relief with tramadol).  Psychiatric/Behavioral: The patient is nervous/anxious (controlled with xanax).     Objective:  BP 104/72   Pulse 79   Temp 97.7 F (36.5 C) (Oral)   Ht '5\' 2"'  (1.575 m)   Wt 184 lb (83.5 kg)   BMI 33.65 kg/m   BP Readings from Last 3 Encounters:  10/24/15 104/72  12/21/14 110/78  10/23/14 97/66    Wt Readings from Last 3 Encounters:  10/24/15 184 lb (83.5 kg)  12/21/14 179 lb (81.2 kg)  10/23/14 176 lb 12.8 oz (80.2 kg)     Physical Exam  Constitutional: She is oriented to person, place, and time. She appears well-developed and well-nourished. No distress.  HENT:  Head: Normocephalic and atraumatic.  Right Ear: External ear normal.  Left Ear: External ear normal.  Nose: Nose normal.  Mouth/Throat: Oropharynx is clear and moist.  Eyes: Conjunctivae and EOM are normal. Pupils are equal, round, and reactive to light.  Neck: Normal range of motion. Neck supple. No thyromegaly present.  Cardiovascular: Normal rate, regular rhythm and normal heart sounds.   No murmur heard. Pulmonary/Chest: Effort  normal and breath sounds normal. No respiratory distress. She has no wheezes. She has no rales.  Abdominal: Soft. Bowel sounds are normal. She exhibits no distension. There is no tenderness.  Lymphadenopathy:    She has no cervical adenopathy.  Neurological: She is alert and oriented to person, place, and time. She has normal reflexes.  Skin: Skin is warm and dry.  Psychiatric:  She has a normal mood and affect. Her behavior is normal. Judgment and thought content normal.     Lab Results  Component Value Date   WBC 6.1 04/23/2015   HGB 12.9 07/11/2014   HCT 38.7 04/23/2015   PLT 270 04/23/2015   GLUCOSE 56 (L) 04/23/2015   CHOL 187 04/23/2015   TRIG 115 04/23/2015   HDL 65 04/23/2015   LDLCALC 99 04/23/2015   ALT 26 04/23/2015   AST 25 04/23/2015   NA 144 04/23/2015   K 4.2 04/23/2015   CL 105 04/23/2015   CREATININE 0.83 04/23/2015   BUN 20 04/23/2015   CO2 23 04/23/2015   TSH 2.580 07/11/2014    Patient was never admitted.  Assessment & Plan:   Amy Gray was seen today for anxiety, migraines, restless leg, medication refill and arthritis.  Diagnoses and all orders for this visit:  Moderate asthma without complication, unspecified whether persistent -     CBC with Differential/Platelet -     CMP14+EGFR -     PR BREATHING CAPACITY TEST  Generalized anxiety disorder -     CBC with Differential/Platelet -     CMP14+EGFR  Mixed hyperlipidemia -     CBC with Differential/Platelet -     CMP14+EGFR -     Lipid panel  Restless leg syndrome, uncontrolled -     CBC with Differential/Platelet -     CMP14+EGFR  Fibromyalgia syndrome -     CBC with Differential/Platelet -     CMP14+EGFR  Chronic migraine without aura without status migrainosus, not intractable -     CBC with Differential/Platelet -     CMP14+EGFR  Other orders -     ALPRAZolam (XANAX) 0.5 MG tablet; Take 1 tablet (0.5 mg total) by mouth 2 (two) times daily. -     traMADol (ULTRAM) 50 MG tablet; Take 1 tablet (50 mg total) by mouth every 6 (six) hours as needed.    I have discontinued Amy Gray's baclofen, atenolol, meloxicam, atorvastatin, sertraline, and aspirin EC. I am also having her maintain her Vitamin D, ondansetron, estradiol, omeprazole, mirabegron ER, budesonide-formoterol, montelukast, albuterol, aspirin, cetirizine, VENTOLIN HFA, rOPINIRole, fluticasone,  rizatriptan, chlorzoxazone, EPINEPHrine, lamoTRIgine, diclofenac sodium, verapamil, ALPRAZolam, and traMADol.  Meds ordered this encounter  Medications  . rizatriptan (MAXALT) 10 MG tablet    Sig: Take one tablet by mouth once as needed for Migraine. May repeat in 2hours if needed. Max 2 IN 24 hours.  Marland Kitchen DISCONTD: aspirin EC 325 MG tablet    Sig: Take by mouth.  . chlorzoxazone (PARAFON) 500 MG tablet    Sig: Take 500 mg by mouth 3 (three) times daily.  Marland Kitchen EPINEPHrine 0.3 mg/0.3 mL IJ SOAJ injection    Sig: Inject 0.3 mg into the muscle.  . lamoTRIgine (LAMICTAL) 25 MG tablet  . diclofenac sodium (VOLTAREN) 1 % GEL  . verapamil (CALAN) 80 MG tablet  . ALPRAZolam (XANAX) 0.5 MG tablet    Sig: Take 1 tablet (0.5 mg total) by mouth 2 (two) times daily.    Dispense:  60 tablet    Refill:  5    Do not fill until 11/24/2015  . traMADol (ULTRAM) 50 MG tablet    Sig: Take 1 tablet (50 mg total) by mouth every 6 (six) hours as needed.    Dispense:  50 tablet    Refill:  5    Do not fill until 11/24/2015     Follow-up: Return in about 2 months (around 12/24/2015) for Wellness.  Claretta Fraise, M.D.

## 2015-10-24 NOTE — Addendum Note (Signed)
Addended by: Almeta MonasSTONE, JANIE M on: 10/24/2015 02:52 PM   Modules accepted: Orders

## 2015-10-25 ENCOUNTER — Other Ambulatory Visit: Payer: Self-pay | Admitting: *Deleted

## 2015-10-25 DIAGNOSIS — E782 Mixed hyperlipidemia: Secondary | ICD-10-CM

## 2015-10-25 MED ORDER — ROSUVASTATIN CALCIUM 10 MG PO TABS: 10.0000 mg | ORAL_TABLET | Freq: Every day | ORAL | 1 refills | Status: DC

## 2015-11-17 ENCOUNTER — Other Ambulatory Visit: Payer: Self-pay | Admitting: Family Medicine

## 2015-11-20 ENCOUNTER — Encounter: Payer: Self-pay | Admitting: Family Medicine

## 2015-12-17 ENCOUNTER — Other Ambulatory Visit: Payer: Self-pay | Admitting: Family Medicine

## 2015-12-24 ENCOUNTER — Encounter: Payer: Self-pay | Admitting: Family Medicine

## 2015-12-24 ENCOUNTER — Ambulatory Visit (INDEPENDENT_AMBULATORY_CARE_PROVIDER_SITE_OTHER): Payer: Managed Care, Other (non HMO) | Admitting: Family Medicine

## 2015-12-24 VITALS — BP 114/86 | HR 77 | Temp 96.8°F | Ht 62.0 in | Wt 186.0 lb

## 2015-12-24 DIAGNOSIS — G43709 Chronic migraine without aura, not intractable, without status migrainosus: Secondary | ICD-10-CM

## 2015-12-24 DIAGNOSIS — F411 Generalized anxiety disorder: Secondary | ICD-10-CM

## 2015-12-24 DIAGNOSIS — Z0000 Encounter for general adult medical examination without abnormal findings: Secondary | ICD-10-CM | POA: Diagnosis not present

## 2015-12-24 DIAGNOSIS — R202 Paresthesia of skin: Secondary | ICD-10-CM

## 2015-12-24 DIAGNOSIS — E782 Mixed hyperlipidemia: Secondary | ICD-10-CM

## 2015-12-24 DIAGNOSIS — Z23 Encounter for immunization: Secondary | ICD-10-CM | POA: Diagnosis not present

## 2015-12-24 DIAGNOSIS — M797 Fibromyalgia: Secondary | ICD-10-CM

## 2015-12-24 LAB — URINALYSIS
Bilirubin, UA: NEGATIVE
Glucose, UA: NEGATIVE
KETONES UA: NEGATIVE
LEUKOCYTES UA: NEGATIVE
NITRITE UA: NEGATIVE
PROTEIN UA: NEGATIVE
RBC UA: NEGATIVE
SPEC GRAV UA: 1.01 (ref 1.005–1.030)
Urobilinogen, Ur: 0.2 mg/dL (ref 0.2–1.0)
pH, UA: 7 (ref 5.0–7.5)

## 2015-12-24 NOTE — Progress Notes (Signed)
Subjective:  Patient ID: Amy Gray, female    DOB: 03-15-1960  Age: 55 y.o. MRN: [id number]  CC: Annual Exam   HPI Satia Winger presents for CPE. Taking new med for increased intracranial pressure. Had LP with pressure over 200. Tingling in finger tips, into toes. Greater on right. Currently feels mentally foggy.   History Cara has a past medical history of Anxiety; Asthma; Depression; GERD (gastroesophageal reflux disease); Hyperlipidemia; Migraines; and Osteoarthritis.   She has a past surgical history that includes Cervical fusion (2015); Rhinoplasty; and Abdominal hysterectomy.   Her family history includes Arthritis in her father; Asthma in her mother; Heart disease in her father and mother; Hypertension in her sister.She reports that she has never smoked. She does not have any smokeless tobacco history on file. She reports that she does not drink alcohol or use drugs.    ROS Review of Systems  Constitutional: Negative for activity change, appetite change, chills, diaphoresis, fatigue, fever and unexpected weight change.  HENT: Negative for congestion, ear pain, hearing loss, postnasal drip, rhinorrhea, sneezing, sore throat and trouble swallowing.   Eyes: Negative for pain and visual disturbance.  Respiratory: Negative for cough, chest tightness and shortness of breath.   Cardiovascular: Negative for chest pain and palpitations.  Gastrointestinal: Positive for nausea. Negative for abdominal pain, constipation, diarrhea and vomiting.  Endocrine: Negative for cold intolerance, heat intolerance, polydipsia, polyphagia and polyuria.  Genitourinary: Negative for dysuria, frequency and menstrual problem.  Musculoskeletal: Positive for arthralgias, joint swelling and myalgias.  Skin: Negative for rash.  Allergic/Immunologic: Negative for environmental allergies.  Neurological: Positive for dizziness, weakness, numbness and headaches.  Psychiatric/Behavioral: Positive  for agitation and sleep disturbance. Negative for dysphoric mood and suicidal ideas.    Objective:  BP 114/86   Pulse 77   Temp (!) 96.8 F (36 C) (Oral)   Ht _0  (1.575 m)   Wt 186 lb (84.4 kg)   BMI 34.02 kg/m   BP Readings from Last 3 Encounters:  12/24/15 114/86  10/24/15 104/72  12/21/14 110/78    Wt Readings from Last 3 Encounters:  12/24/15 186 lb (84.4 kg)  10/24/15 184 lb (83.5 kg)  12/21/14 179 lb (81.2 kg)     Physical Exam  Constitutional: She is oriented to person, place, and time. She appears well-developed and well-nourished. No distress.  HENT:  Head: Normocephalic and atraumatic.  Right Ear: External ear normal.  Left Ear: External ear normal.  Nose: Nose normal.  Mouth/Throat: Oropharynx is clear and moist.  Eyes: Conjunctivae and EOM are normal. Pupils are equal, round, and reactive to light.  Neck: Normal range of motion. Neck supple. No thyromegaly present.  Cardiovascular: Normal rate, regular rhythm and normal heart sounds.   No murmur heard. Pulmonary/Chest: Effort normal and breath sounds normal. No respiratory distress. She has no wheezes. She has no rales. Right breast exhibits no inverted nipple, no mass and no tenderness. Left breast exhibits no inverted nipple, no mass and no tenderness. Breasts are symmetrical.  Abdominal: Soft. Normal appearance and bowel sounds are normal. She exhibits no distension, no abdominal bruit and no mass. There is no splenomegaly or hepatomegaly. There is no tenderness. There is no tenderness at McBurney's point and negative Murphy's sign.  Genitourinary: No breast swelling or tenderness.  Musculoskeletal: Normal range of motion. She exhibits no edema or tenderness.  Lymphadenopathy:    She has no cervical adenopathy.  Neurological: She is alert and oriented to person, place, and time. She has  normal reflexes.  Skin: Skin is warm and dry. No rash noted.  Psychiatric: She has a normal mood and affect. Her  behavior is normal. Judgment and thought content normal.     Lab Results  Component Value Date   WBC 6.4 10/24/2015   HGB 12.9 07/11/2014   HCT 40.0 10/24/2015   PLT 277 10/24/2015   GLUCOSE 58 (L) 10/24/2015   CHOL 227 (H) 10/24/2015   TRIG 183 (H) 10/24/2015   HDL 61 10/24/2015   LDLCALC 129 (H) 10/24/2015   ALT 40 (H) 10/24/2015   AST 25 10/24/2015   NA 140 10/24/2015   K 4.3 10/24/2015   CL 100 10/24/2015   CREATININE 0.86 10/24/2015   BUN 18 10/24/2015   CO2 25 10/24/2015   TSH 2.580 07/11/2014    Patient was never admitted.  Assessment & Plan:   Davida was seen today for annual exam.  Diagnoses and all orders for this visit:  Well adult exam -     CMP14+EGFR -     Urinalysis -     Lipid panel -     CBC with Differential/Platelet -     Cancel: Urinalysis -     Vitamin B12 -     VITAMIN D 25 Hydroxy (Vit-D Deficiency, Fractures) -     Fe+TIBC+Fer  Fibromyalgia syndrome -     CMP14+EGFR -     Urinalysis -     Lipid panel -     CBC with Differential/Platelet -     Cancel: Urinalysis -     Vitamin B12 -     VITAMIN D 25 Hydroxy (Vit-D Deficiency, Fractures) -     Fe+TIBC+Fer  Chronic migraine without aura without status migrainosus, not intractable  Generalized anxiety disorder  Mixed hyperlipidemia -     Lipid panel  Paresthesias -     Vitamin B12 -     VITAMIN D 25 Hydroxy (Vit-D Deficiency, Fractures) -     Fe+TIBC+Fer  Other orders -     Flu Vaccine QUAD 36+ mos IM    I have discontinued Ms. Barham's lamoTRIgine and verapamil. I am also having her maintain her Vitamin D, ondansetron, aspirin, cetirizine, VENTOLIN HFA, rizatriptan, chlorzoxazone, EPINEPHrine, diclofenac sodium, ALPRAZolam, traMADol, albuterol, estradiol, rOPINIRole, mirabegron ER, budesonide-formoterol, omeprazole, rosuvastatin, montelukast, fluticasone, dicyclomine, acetaZOLAMIDE, promethazine, and Coenzyme Q10.  Meds ordered this encounter  Medications  . dicyclomine  (BENTYL) 10 MG capsule    Sig: Take 1 capsule by mouth daily before breakfast.  . acetaZOLAMIDE (DIAMOX) 125 MG tablet    Sig: Take 2 tablets by mouth 2 (two) times daily.  . promethazine (PHENERGAN) 12.5 MG tablet    Sig: TAKE 1 TO 2 TABLETS BY MOUTH EVERY 8 HOURS AS NEEDED FOR NAUSEA  . Coenzyme Q10 200 MG capsule    Sig: Take 200 mg by mouth daily.     Follow-up: No Follow-up on file.  Claretta Fraise, M.D.

## 2015-12-25 ENCOUNTER — Encounter: Payer: Self-pay | Admitting: Family Medicine

## 2015-12-25 LAB — CMP14+EGFR
ALK PHOS: 108 IU/L (ref 39–117)
ALT: 38 IU/L — AB (ref 0–32)
AST: 25 IU/L (ref 0–40)
Albumin/Globulin Ratio: 1.8 (ref 1.2–2.2)
Albumin: 4.2 g/dL (ref 3.5–5.5)
BUN/Creatinine Ratio: 16 (ref 9–23)
BUN: 12 mg/dL (ref 6–24)
Bilirubin Total: 0.2 mg/dL (ref 0.0–1.2)
CALCIUM: 8.8 mg/dL (ref 8.7–10.2)
CO2: 21 mmol/L (ref 18–29)
CREATININE: 0.76 mg/dL (ref 0.57–1.00)
Chloride: 108 mmol/L — ABNORMAL HIGH (ref 96–106)
GFR calc Af Amer: 102 mL/min/{1.73_m2} (ref >59)
GFR, EST NON AFRICAN AMERICAN: 89 mL/min/{1.73_m2} (ref >59)
GLOBULIN, TOTAL: 2.3 g/dL (ref 1.5–4.5)
GLUCOSE: 84 mg/dL (ref 65–99)
Potassium: 3.8 mmol/L (ref 3.5–5.2)
SODIUM: 141 mmol/L (ref 134–144)
Total Protein: 6.5 g/dL (ref 6.0–8.5)

## 2015-12-25 LAB — CBC WITH DIFFERENTIAL/PLATELET
BASOS ABS: 0 10*3/uL (ref 0.0–0.2)
Basos: 0 %
EOS (ABSOLUTE): 0.1 10*3/uL (ref 0.0–0.4)
Eos: 2 %
Hematocrit: 39.9 % (ref 34.0–46.6)
Hemoglobin: 13.3 g/dL (ref 11.1–15.9)
Immature Grans (Abs): 0 10*3/uL (ref 0.0–0.1)
Immature Granulocytes: 0 %
LYMPHS ABS: 2.1 10*3/uL (ref 0.7–3.1)
LYMPHS: 28 %
MCH: 30 pg (ref 26.6–33.0)
MCHC: 33.3 g/dL (ref 31.5–35.7)
MCV: 90 fL (ref 79–97)
MONOCYTES: 9 %
Monocytes Absolute: 0.7 10*3/uL (ref 0.1–0.9)
NEUTROS ABS: 4.6 10*3/uL (ref 1.4–7.0)
Neutrophils: 61 %
PLATELETS: 276 10*3/uL (ref 150–379)
RBC: 4.44 x10E6/uL (ref 3.77–5.28)
RDW: 14.4 % (ref 12.3–15.4)
WBC: 7.6 10*3/uL (ref 3.4–10.8)

## 2015-12-25 LAB — VITAMIN D 25 HYDROXY (VIT D DEFICIENCY, FRACTURES): VIT D 25 HYDROXY: 39.1 ng/mL (ref 30.0–100.0)

## 2015-12-25 LAB — FE+TIBC+FER
Ferritin: 31 ng/mL (ref 15–150)
Iron Saturation: 14 % — ABNORMAL LOW (ref 15–55)
Iron: 46 ug/dL (ref 27–159)
Total Iron Binding Capacity: 321 ug/dL (ref 250–450)
UIBC: 275 ug/dL (ref 131–425)

## 2015-12-25 LAB — VITAMIN B12: VITAMIN B 12: 556 pg/mL (ref 232–1245)

## 2015-12-25 LAB — LIPID PANEL
CHOL/HDL RATIO: 3.2 ratio (ref 0.0–4.4)
Cholesterol, Total: 169 mg/dL (ref 100–199)
HDL: 53 mg/dL (ref >39)
LDL Calculated: 91 mg/dL (ref 0–99)
Triglycerides: 124 mg/dL (ref 0–149)
VLDL CHOLESTEROL CAL: 25 mg/dL (ref 5–40)

## 2015-12-26 LAB — URINALYSIS
Bilirubin, UA: NEGATIVE
Glucose, UA: NEGATIVE
Ketones, UA: NEGATIVE
LEUKOCYTES UA: NEGATIVE
NITRITE UA: NEGATIVE
PH UA: 7 (ref 5.0–7.5)
PROTEIN UA: NEGATIVE
RBC, UA: NEGATIVE
Specific Gravity, UA: 1.01 (ref 1.005–1.030)
Urobilinogen, Ur: 0.2 mg/dL (ref 0.2–1.0)

## 2016-01-08 ENCOUNTER — Encounter: Payer: Self-pay | Admitting: Family Medicine

## 2016-01-11 MED ORDER — TRAZODONE HCL 150 MG PO TABS: ORAL_TABLET | ORAL | 5 refills | Status: DC

## 2016-03-15 ENCOUNTER — Other Ambulatory Visit: Payer: Self-pay | Admitting: Family Medicine

## 2016-03-24 ENCOUNTER — Ambulatory Visit: Payer: Managed Care, Other (non HMO) | Admitting: Family Medicine

## 2016-04-02 ENCOUNTER — Ambulatory Visit: Payer: Managed Care, Other (non HMO) | Admitting: Family Medicine

## 2016-04-08 ENCOUNTER — Encounter: Payer: Self-pay | Admitting: Family Medicine

## 2016-04-09 ENCOUNTER — Ambulatory Visit (INDEPENDENT_AMBULATORY_CARE_PROVIDER_SITE_OTHER): Payer: 59 | Admitting: Family Medicine

## 2016-04-09 ENCOUNTER — Encounter: Payer: Self-pay | Admitting: Family Medicine

## 2016-04-09 VITALS — BP 94/66 | HR 78 | Temp 97.3°F | Ht 62.0 in | Wt 176.0 lb

## 2016-04-09 DIAGNOSIS — E782 Mixed hyperlipidemia: Secondary | ICD-10-CM | POA: Diagnosis not present

## 2016-04-09 DIAGNOSIS — G43709 Chronic migraine without aura, not intractable, without status migrainosus: Secondary | ICD-10-CM

## 2016-04-09 DIAGNOSIS — M797 Fibromyalgia: Secondary | ICD-10-CM

## 2016-04-09 DIAGNOSIS — G4459 Other complicated headache syndrome: Secondary | ICD-10-CM

## 2016-04-09 NOTE — Progress Notes (Signed)
Subjective:  Patient ID: Amy Gray, female    DOB: [birth year]  Age: 56 y.o. MRN: [id number]  CC: Hyperlipidemia (pt here today for routine follow up on her cholesterol and she wants to discuss her mammogram she had yesterday.)   HPI Amy Gray presents for Her for her headaches and is getting Botox injections soon. However she also has a diagnosis of fluid on the brain and has been told that she needs a shunt. She is seeing neurosurgery for that in the next few weeks.  Patient says that she was told weight loss would help with her headaches and she has lost 10 pounds on a diet of low carbs and decreased portions.  Patient in for follow-up of elevated cholesterol. Doing well without complaints on current medication. Denies side effects of statin including myalgia and arthralgia and nausea. Also in today for liver function testing. Currently no chest pain, shortness of breath or other cardiovascular related symptoms noted.  She is concerned about the mammogram result. That is attached and reviewed. There was apparently some fibroglandular tissue and she has been called back in for spot compression views as well as an ultrasound.   History Amy Gray has a past medical history of Anxiety; Asthma; Depression; GERD (gastroesophageal reflux disease); Hyperlipidemia; Migraines; and Osteoarthritis.   She has a past surgical history that includes Cervical fusion (2015); Rhinoplasty; and Abdominal hysterectomy.   Her family history includes Arthritis in her father; Asthma in her mother; Heart disease in her father and mother; Hypertension in her sister.She reports that she has never smoked. She has never used smokeless tobacco. She reports that she does not drink alcohol or use drugs.    ROS Review of Systems  Constitutional: Negative for activity change, appetite change and fever.  HENT: Negative for congestion, rhinorrhea and sore throat.   Eyes: Negative for visual disturbance.    Respiratory: Negative for cough and shortness of breath.   Cardiovascular: Negative for chest pain and palpitations.  Gastrointestinal: Negative for abdominal pain, diarrhea and nausea.  Genitourinary: Negative for dysuria.  Musculoskeletal: Negative for arthralgias and myalgias.  Neurological: Positive for headaches.    Objective:  BP 94/66   Pulse 78   Temp 97.3 F (36.3 C) (Oral)   Ht '5\' 2"'  (1.575 m)   Wt 176 lb (79.8 kg)   BMI 32.19 kg/m   BP Readings from Last 3 Encounters:  04/09/16 94/66  12/24/15 114/86  10/24/15 104/72    Wt Readings from Last 3 Encounters:  04/09/16 176 lb (79.8 kg)  12/24/15 186 lb (84.4 kg)  10/24/15 184 lb (83.5 kg)     Physical Exam  Constitutional: She is oriented to person, place, and time. She appears well-developed and well-nourished. No distress.  HENT:  Head: Normocephalic and atraumatic.  Eyes: Conjunctivae are normal. Pupils are equal, round, and reactive to light.  Neck: Normal range of motion. Neck supple. No thyromegaly present.  Cardiovascular: Normal rate, regular rhythm and normal heart sounds.   No murmur heard. Pulmonary/Chest: Effort normal and breath sounds normal. No respiratory distress. She has no wheezes. She has no rales.  Abdominal: Soft. Bowel sounds are normal. She exhibits no distension. There is no tenderness.  Musculoskeletal: Normal range of motion.  Lymphadenopathy:    She has no cervical adenopathy.  Neurological: She is alert and oriented to person, place, and time.  Skin: Skin is warm and dry.  Psychiatric: She has a normal mood and affect. Her behavior is normal. Judgment and thought  content normal.      Assessment & Plan:   Amy Gray was seen today for hyperlipidemia.  Diagnoses and all orders for this visit:  Mixed hyperlipidemia -     CMP14+EGFR -     Lipid panel -     TSH  Fibromyalgia syndrome -     CBC with Differential/Platelet -     TSH  Chronic migraine without aura without  status migrainosus, not intractable -     CBC with Differential/Platelet -     TSH  Other complicated headache syndrome -     CBC with Differential/Platelet -     TSH   reassured her that the mammogram callback is at this point rather routine. We discussed the pros and cons of shunt placement versus headache treatment.     I have discontinued Ms. Mealor's mirabegron ER. I am also having her maintain her Vitamin D, ondansetron, aspirin, cetirizine, VENTOLIN HFA, rizatriptan, chlorzoxazone, EPINEPHrine, diclofenac sodium, ALPRAZolam, traMADol, albuterol, estradiol, rOPINIRole, budesonide-formoterol, omeprazole, montelukast, fluticasone, dicyclomine, promethazine, Coenzyme Q10, traZODone, rosuvastatin, and acetaZOLAMIDE.  Allergies as of 04/09/2016      Reactions   Pregabalin Shortness Of Breath   Diclofenac Sodium    Other reaction(s): Other Update   Sibutramine    Other reaction(s): Other update   Topiramate Other (See Comments)   Amitriptyline    Duloxetine Hcl    Guaifenesin Er    Oxybutynin    Gabapentin    Other reaction(s): Confusion   Propranolol Anxiety      Medication List       Accurate as of 04/09/16  6:26 PM. Always use your most recent med list.          acetaZOLAMIDE 500 MG capsule Commonly known as:  DIAMOX Take 500 mg by mouth 2 (two) times daily.   ALPRAZolam 0.5 MG tablet Commonly known as:  XANAX Take 1 tablet (0.5 mg total) by mouth 2 (two) times daily.   aspirin 325 MG EC tablet Take 1 tablet by mouth daily.   budesonide-formoterol 160-4.5 MCG/ACT inhaler Commonly known as:  SYMBICORT INHALE 2 PUFFS INTO THE LUNGS TWICE DAILY   cetirizine 10 MG tablet Commonly known as:  ZYRTEC Take 1 tablet (10 mg total) by mouth daily.   chlorzoxazone 500 MG tablet Commonly known as:  PARAFON Take 500 mg by mouth 3 (three) times daily.   Coenzyme Q10 200 MG capsule Take 200 mg by mouth daily.   diclofenac sodium 1 % Gel Commonly known as:   VOLTAREN   dicyclomine 10 MG capsule Commonly known as:  BENTYL Take 1 capsule by mouth daily before breakfast.   EPINEPHrine 0.3 mg/0.3 mL Soaj injection Commonly known as:  EPI-PEN Inject 0.3 mg into the muscle.   estradiol 0.5 MG tablet Commonly known as:  ESTRACE Take 1 tablet (0.5 mg total) by mouth daily.   fluticasone 50 MCG/ACT nasal spray Commonly known as:  FLONASE USE two sprays IN each nostril once daily   montelukast 10 MG tablet Commonly known as:  SINGULAIR TAKE 1 TABLET BY MOUTH AT BEDTIME   omeprazole 40 MG capsule Commonly known as:  PRILOSEC Take 1 capsule (40 mg total) by mouth daily.   ondansetron 8 MG tablet Commonly known as:  ZOFRAN   promethazine 12.5 MG tablet Commonly known as:  PHENERGAN TAKE 1 TO 2 TABLETS BY MOUTH EVERY 8 HOURS AS NEEDED FOR NAUSEA   rizatriptan 10 MG tablet Commonly known as:  MAXALT Take one tablet by mouth  once as needed for Migraine. May repeat in 2hours if needed. Max 2 IN 24 hours.   rOPINIRole 2 MG tablet Commonly known as:  REQUIP Take 1 tablet (2 mg total) by mouth at bedtime.   rosuvastatin 10 MG tablet Commonly known as:  CRESTOR Take 1 tablet (10 mg total) by mouth daily.   traMADol 50 MG tablet Commonly known as:  ULTRAM Take 1 tablet (50 mg total) by mouth every 6 (six) hours as needed.   traZODone 150 MG tablet Commonly known as:  DESYREL Use from 1/3 to 1 tablet nightly as needed for sleep.   VENTOLIN HFA 108 (90 Base) MCG/ACT inhaler Generic drug:  albuterol INHALE 2 PUFFS INTO THE LUNGS FOUR TIMES A DAY AS NEEDED FOR BREATHING   albuterol (2.5 MG/3ML) 0.083% nebulizer solution Commonly known as:  PROVENTIL INHALE 3 mLs (2.5 mg total) INTO THE LUNGS by nebulization every 4 (four) hours as needed for wheezing or shortness of breath.   Vitamin D 2000 units Caps Take 2,000 Units by mouth daily.        Follow-up: Return in about 3 months (around 07/10/2016).  Claretta Fraise, M.D.

## 2016-04-10 LAB — CMP14+EGFR
ALT: 40 IU/L — ABNORMAL HIGH (ref 0–32)
AST: 36 IU/L (ref 0–40)
Albumin/Globulin Ratio: 1.9 (ref 1.2–2.2)
Albumin: 4.5 g/dL (ref 3.5–5.5)
Alkaline Phosphatase: 102 IU/L (ref 39–117)
BUN/Creatinine Ratio: 16 (ref 9–23)
BUN: 15 mg/dL (ref 6–24)
Bilirubin Total: 0.4 mg/dL (ref 0.0–1.2)
CALCIUM: 9.5 mg/dL (ref 8.7–10.2)
CO2: 20 mmol/L (ref 18–29)
Chloride: 107 mmol/L — ABNORMAL HIGH (ref 96–106)
Creatinine, Ser: 0.93 mg/dL (ref 0.57–1.00)
GFR, EST AFRICAN AMERICAN: 80 mL/min/{1.73_m2} (ref >59)
GFR, EST NON AFRICAN AMERICAN: 69 mL/min/{1.73_m2} (ref >59)
GLUCOSE: 80 mg/dL (ref 65–99)
Globulin, Total: 2.4 g/dL (ref 1.5–4.5)
Potassium: 4 mmol/L (ref 3.5–5.2)
Sodium: 141 mmol/L (ref 134–144)
Total Protein: 6.9 g/dL (ref 6.0–8.5)

## 2016-04-10 LAB — TSH: TSH: 3.02 u[IU]/mL (ref 0.450–4.500)

## 2016-04-10 LAB — CBC WITH DIFFERENTIAL/PLATELET
BASOS ABS: 0 10*3/uL (ref 0.0–0.2)
BASOS: 0 %
EOS (ABSOLUTE): 0.1 10*3/uL (ref 0.0–0.4)
Eos: 2 %
Hematocrit: 39.8 % (ref 34.0–46.6)
Hemoglobin: 13.4 g/dL (ref 11.1–15.9)
IMMATURE GRANULOCYTES: 0 %
Immature Grans (Abs): 0 10*3/uL (ref 0.0–0.1)
LYMPHS: 33 %
Lymphocytes Absolute: 1.8 10*3/uL (ref 0.7–3.1)
MCH: 29.3 pg (ref 26.6–33.0)
MCHC: 33.7 g/dL (ref 31.5–35.7)
MCV: 87 fL (ref 79–97)
Monocytes Absolute: 0.6 10*3/uL (ref 0.1–0.9)
Monocytes: 11 %
NEUTROS PCT: 54 %
Neutrophils Absolute: 2.9 10*3/uL (ref 1.4–7.0)
PLATELETS: 262 10*3/uL (ref 150–379)
RBC: 4.58 x10E6/uL (ref 3.77–5.28)
RDW: 13.8 % (ref 12.3–15.4)
WBC: 5.4 10*3/uL (ref 3.4–10.8)

## 2016-04-10 LAB — LIPID PANEL
CHOL/HDL RATIO: 2.5 ratio (ref 0.0–4.4)
Cholesterol, Total: 145 mg/dL (ref 100–199)
HDL: 57 mg/dL (ref >39)
LDL Calculated: 71 mg/dL (ref 0–99)
Triglycerides: 86 mg/dL (ref 0–149)
VLDL CHOLESTEROL CAL: 17 mg/dL (ref 5–40)

## 2016-04-26 ENCOUNTER — Other Ambulatory Visit: Payer: Self-pay | Admitting: Family Medicine

## 2016-05-14 ENCOUNTER — Telehealth: Payer: Self-pay | Admitting: Family Medicine

## 2016-05-15 ENCOUNTER — Other Ambulatory Visit: Payer: Self-pay | Admitting: Family Medicine

## 2016-05-15 NOTE — Telephone Encounter (Signed)
Last filled 04/15/2016. Last seen 04/09/2016. Next appointment 07/11/2016. If approved please print for patient to pick up.

## 2016-05-16 ENCOUNTER — Other Ambulatory Visit: Payer: Self-pay | Admitting: *Deleted

## 2016-05-16 MED ORDER — ALPRAZOLAM 0.5 MG PO TABS
0.5000 mg | ORAL_TABLET | Freq: Two times a day (BID) | ORAL | 5 refills | Status: DC
Start: 2016-05-16 — End: 2016-10-10

## 2016-05-16 MED ORDER — TRAMADOL HCL 50 MG PO TABS: 50.0000 mg | ORAL_TABLET | Freq: Four times a day (QID) | ORAL | 5 refills | Status: DC | PRN

## 2016-06-26 ENCOUNTER — Other Ambulatory Visit: Payer: Self-pay | Admitting: Family Medicine

## 2016-07-11 ENCOUNTER — Encounter: Payer: Self-pay | Admitting: Family Medicine

## 2016-07-11 ENCOUNTER — Ambulatory Visit (INDEPENDENT_AMBULATORY_CARE_PROVIDER_SITE_OTHER): Payer: 59 | Admitting: Family Medicine

## 2016-07-11 VITALS — BP 93/65 | HR 74 | Temp 97.2°F | Ht 62.0 in | Wt 153.0 lb

## 2016-07-11 DIAGNOSIS — G932 Benign intracranial hypertension: Secondary | ICD-10-CM | POA: Diagnosis not present

## 2016-07-11 DIAGNOSIS — F411 Generalized anxiety disorder: Secondary | ICD-10-CM | POA: Diagnosis not present

## 2016-07-11 DIAGNOSIS — E782 Mixed hyperlipidemia: Secondary | ICD-10-CM | POA: Diagnosis not present

## 2016-07-11 NOTE — Progress Notes (Signed)
Subjective:  Patient ID: Amy Gray, female    DOB: Feb 26, 1960  Age: 56 y.o. MRN: [id number]  CC: Follow-up (pt here today for routine follow up of her chronic medical conditions and also has questions about taking xanax long term.)   HPI Amy Gray presents for Follow-up after newly diagnosed with idiopathic intracranial hypertension. She just underwent shunt surgery about a month ago. She was told that when they tapped into the area of the brain where they placed the shunt that the pressure was so high that it sprayed the surgical field. The headache specialist she had seen had told her that she wasn't a candidate for surgery. She feels just a little bit better about that at this point. However, she is pleased to know that some of her symptoms have been addressed and hopefully will be improving soon. Meanwhile she still is somewhat light sensitive. She has gone on a strict diet based on the surgeon's recommendation. She is on 1200 cal of a high-protein low-carb approach. Through this she has lost 23 pounds since her last visit here. She denies any shortness of breath. She does remain anxious. She is concerned that she heard a rumor that Xanax causes increased risk of dementia.   History Amy Gray has a past medical history of Anxiety; Asthma; Depression; GERD (gastroesophageal reflux disease); Hyperlipidemia; Migraines; and Osteoarthritis.   She has a past surgical history that includes Cervical fusion (2015); Rhinoplasty; and Abdominal hysterectomy.   Her family history includes Arthritis in her father; Asthma in her mother; Heart disease in her father and mother; Hypertension in her sister.She reports that she has never smoked. She has never used smokeless tobacco. She reports that she does not drink alcohol or use drugs.    ROS Review of Systems  Constitutional: Negative for activity change, appetite change and fever.  HENT: Negative for congestion, rhinorrhea and sore throat.    Eyes: Negative for visual disturbance.  Respiratory: Negative for cough and shortness of breath.   Cardiovascular: Negative for chest pain and palpitations.  Gastrointestinal: Negative for abdominal pain, diarrhea and nausea.  Genitourinary: Negative for dysuria.  Musculoskeletal: Negative for arthralgias and myalgias.    Objective:  BP 93/65   Pulse 74   Temp 97.2 F (36.2 C) (Oral)   Ht _0  (1.575 m)   Wt 153 lb (69.4 kg)   BMI 27.98 kg/m   BP Readings from Last 3 Encounters:  07/11/16 93/65  04/09/16 94/66  12/24/15 114/86    Wt Readings from Last 3 Encounters:  07/11/16 153 lb (69.4 kg)  04/09/16 176 lb (79.8 kg)  12/24/15 186 lb (84.4 kg)     Physical Exam  Constitutional: She is oriented to person, place, and time. She appears well-developed and well-nourished. No distress.  HENT:  Head: Normocephalic and atraumatic.  Right Ear: External ear normal.  Left Ear: External ear normal.  Nose: Nose normal.  Mouth/Throat: Oropharynx is clear and moist.  Eyes: Conjunctivae and EOM are normal. Pupils are equal, round, and reactive to light.  Neck: Normal range of motion. Neck supple. No thyromegaly present.  Cardiovascular: Normal rate, regular rhythm and normal heart sounds.   No murmur heard. Pulmonary/Chest: Effort normal and breath sounds normal. No respiratory distress. She has no wheezes. She has no rales.  Abdominal: Soft. Bowel sounds are normal. She exhibits no distension. There is no tenderness.  Lymphadenopathy:    She has no cervical adenopathy.  Neurological: She is alert and oriented to person, place, and  time. She has normal reflexes.  Skin: Skin is warm and dry.  Psychiatric: She has a normal mood and affect. Her behavior is normal. Judgment and thought content normal.      Assessment & Plan:   Amy Gray was seen today for follow-up.  Diagnoses and all orders for this visit:  Mixed hyperlipidemia -     CBC with Differential/Platelet -      CMP14+EGFR -     Lipid panel  Generalized anxiety disorder  IIH (idiopathic intracranial hypertension) -     CBC with Differential/Platelet -     CMP14+EGFR       I have discontinued Amy Gray's albuterol and VENTOLIN HFA. I am also having her maintain her Vitamin D, ondansetron, aspirin, rizatriptan, chlorzoxazone, EPINEPHrine, diclofenac sodium, rOPINIRole, budesonide-formoterol, omeprazole, montelukast, fluticasone, dicyclomine, promethazine, Coenzyme Q10, traZODone, rosuvastatin, acetaZOLAMIDE, cetirizine, estradiol, traMADol, and ALPRAZolam.  Allergies as of 07/11/2016      Reactions   Pregabalin Shortness Of Breath   Diclofenac Sodium    Other reaction(s): Other Update   Sibutramine    Other reaction(s): Other update   Topiramate Other (See Comments)   Amitriptyline    Duloxetine Hcl    Guaifenesin Er    Oxybutynin    Gabapentin    Other reaction(s): Confusion   Propranolol Anxiety      Medication List       Accurate as of 07/11/16  2:21 PM. Always use your most recent med list.          acetaZOLAMIDE 500 MG capsule Commonly known as:  DIAMOX Take 500 mg by mouth 2 (two) times daily.   ALPRAZolam 0.5 MG tablet Commonly known as:  XANAX Take 1 tablet (0.5 mg total) by mouth 2 (two) times daily.   aspirin 325 MG EC tablet Take 1 tablet by mouth daily.   budesonide-formoterol 160-4.5 MCG/ACT inhaler Commonly known as:  SYMBICORT INHALE 2 PUFFS INTO THE LUNGS TWICE DAILY   cetirizine 10 MG tablet Commonly known as:  ZYRTEC Take 1 tablet (10 mg total) by mouth daily.   chlorzoxazone 500 MG tablet Commonly known as:  PARAFON Take 500 mg by mouth 3 (three) times daily.   Coenzyme Q10 200 MG capsule Take 200 mg by mouth daily.   diclofenac sodium 1 % Gel Commonly known as:  VOLTAREN   dicyclomine 10 MG capsule Commonly known as:  BENTYL Take 1 capsule by mouth daily before breakfast.   EPINEPHrine 0.3 mg/0.3 mL Soaj injection Commonly known  as:  EPI-PEN Inject 0.3 mg into the muscle.   estradiol 0.5 MG tablet Commonly known as:  ESTRACE Take 1 tablet (0.5 mg total) by mouth daily.   fluticasone 50 MCG/ACT nasal spray Commonly known as:  FLONASE USE two sprays IN each nostril once daily   montelukast 10 MG tablet Commonly known as:  SINGULAIR TAKE 1 TABLET BY MOUTH AT BEDTIME   omeprazole 40 MG capsule Commonly known as:  PRILOSEC Take 1 capsule (40 mg total) by mouth daily.   ondansetron 8 MG tablet Commonly known as:  ZOFRAN   promethazine 12.5 MG tablet Commonly known as:  PHENERGAN TAKE 1 TO 2 TABLETS BY MOUTH EVERY 8 HOURS AS NEEDED FOR NAUSEA   rizatriptan 10 MG tablet Commonly known as:  MAXALT Take one tablet by mouth once as needed for Migraine. May repeat in 2hours if needed. Max 2 IN 24 hours.   rOPINIRole 2 MG tablet Commonly known as:  REQUIP Take 1 tablet (2 mg  total) by mouth at bedtime.   rosuvastatin 10 MG tablet Commonly known as:  CRESTOR Take 1 tablet (10 mg total) by mouth daily.   traMADol 50 MG tablet Commonly known as:  ULTRAM Take 1 tablet (50 mg total) by mouth every 6 (six) hours as needed.   traZODone 150 MG tablet Commonly known as:  DESYREL Use from 1/3 to 1 tablet nightly as needed for sleep.   Vitamin D 2000 units Caps Take 2,000 Units by mouth daily.      After discussion patient would rather try a different benzodiazepines and as a supplement to her pain meds. My hope is that with the shunt placement perhaps her symptoms will remit and she can be weaned. However, she has an active form of supplied prescription. I have encouraged her to try to taper her slowly. I reminded her that tapering too quickly could lead to withdrawal symptoms. These could even include seizures. Should she not be able to withdraw from these when she comes back at the end of this four-month (prescription that I could switch her to clonazepam and we could try to taper using that medication as  well.  Follow-up: Return in about 3 months (around 10/11/2016).  Claretta Fraise, M.D.

## 2016-07-12 LAB — CBC WITH DIFFERENTIAL/PLATELET
BASOS ABS: 0 10*3/uL (ref 0.0–0.2)
Basos: 0 %
EOS (ABSOLUTE): 0.1 10*3/uL (ref 0.0–0.4)
Eos: 3 %
Hematocrit: 40.6 % (ref 34.0–46.6)
Hemoglobin: 13.5 g/dL (ref 11.1–15.9)
IMMATURE GRANULOCYTES: 0 %
Immature Grans (Abs): 0 10*3/uL (ref 0.0–0.1)
Lymphocytes Absolute: 1.7 10*3/uL (ref 0.7–3.1)
Lymphs: 36 %
MCH: 29.9 pg (ref 26.6–33.0)
MCHC: 33.3 g/dL (ref 31.5–35.7)
MCV: 90 fL (ref 79–97)
MONOS ABS: 0.5 10*3/uL (ref 0.1–0.9)
Monocytes: 10 %
Neutrophils Absolute: 2.4 10*3/uL (ref 1.4–7.0)
Neutrophils: 51 %
PLATELETS: 228 10*3/uL (ref 150–379)
RBC: 4.51 x10E6/uL (ref 3.77–5.28)
RDW: 15 % (ref 12.3–15.4)
WBC: 4.7 10*3/uL (ref 3.4–10.8)

## 2016-07-12 LAB — CMP14+EGFR
A/G RATIO: 1.6 (ref 1.2–2.2)
ALT: 38 IU/L — AB (ref 0–32)
AST: 31 IU/L (ref 0–40)
Albumin: 4.4 g/dL (ref 3.5–5.5)
Alkaline Phosphatase: 124 IU/L — ABNORMAL HIGH (ref 39–117)
BUN/Creatinine Ratio: 22 (ref 9–23)
BUN: 18 mg/dL (ref 6–24)
Bilirubin Total: 0.3 mg/dL (ref 0.0–1.2)
CALCIUM: 9.7 mg/dL (ref 8.7–10.2)
CO2: 21 mmol/L (ref 20–29)
CREATININE: 0.82 mg/dL (ref 0.57–1.00)
Chloride: 110 mmol/L — ABNORMAL HIGH (ref 96–106)
GFR calc Af Amer: 93 mL/min/{1.73_m2} (ref >59)
GFR, EST NON AFRICAN AMERICAN: 80 mL/min/{1.73_m2} (ref >59)
Globulin, Total: 2.8 g/dL (ref 1.5–4.5)
Glucose: 90 mg/dL (ref 65–99)
POTASSIUM: 3.9 mmol/L (ref 3.5–5.2)
Sodium: 145 mmol/L — ABNORMAL HIGH (ref 134–144)
Total Protein: 7.2 g/dL (ref 6.0–8.5)

## 2016-07-12 LAB — LIPID PANEL
CHOL/HDL RATIO: 2.7 ratio (ref 0.0–4.4)
Cholesterol, Total: 168 mg/dL (ref 100–199)
HDL: 62 mg/dL (ref >39)
LDL Calculated: 89 mg/dL (ref 0–99)
TRIGLYCERIDES: 83 mg/dL (ref 0–149)
VLDL Cholesterol Cal: 17 mg/dL (ref 5–40)

## 2016-07-14 ENCOUNTER — Other Ambulatory Visit: Payer: Self-pay | Admitting: Family Medicine

## 2016-07-14 ENCOUNTER — Encounter: Payer: Self-pay | Admitting: Family Medicine

## 2016-08-13 ENCOUNTER — Other Ambulatory Visit: Payer: Self-pay | Admitting: Family Medicine

## 2016-10-08 ENCOUNTER — Telehealth: Payer: Self-pay | Admitting: Family Medicine

## 2016-10-10 ENCOUNTER — Encounter: Payer: Self-pay | Admitting: Family Medicine

## 2016-10-10 ENCOUNTER — Ambulatory Visit (INDEPENDENT_AMBULATORY_CARE_PROVIDER_SITE_OTHER): Payer: 59 | Admitting: Family Medicine

## 2016-10-10 VITALS — BP 98/63 | HR 85 | Temp 97.1°F | Ht 62.0 in | Wt 144.0 lb

## 2016-10-10 DIAGNOSIS — N644 Mastodynia: Secondary | ICD-10-CM

## 2016-10-10 MED ORDER — ALPRAZOLAM 0.5 MG PO TABS: 0.5000 mg | ORAL_TABLET | Freq: Two times a day (BID) | ORAL | 5 refills | Status: DC

## 2016-10-10 NOTE — Progress Notes (Signed)
Subjective:  Patient ID: Amy Gray, female    DOB: 12-19-1960  Age: 56 y.o. MRN: [id number]  CC: Breast Pain (pt here today c/o left breast pain/tenderness and swelling under left arm)   HPI Amy Gray presents for sharp pain at superior aspect left breast. No radiation into the axilla, but feels & looks puffy to her at the axilla. Denies mass on self exam. Tired a lot. Just started taking migraine injections. No affected by activity or by movement of the LUE. (Nonexertional.)  Depression screen [facility] 2/9 10/10/2016 07/11/2016 04/09/2016  Decreased Interest 0 0 0  Down, Depressed, Hopeless 0 0 0  PHQ - 2 Score 0 0 0  Altered sleeping - - -  Tired, decreased energy - - -  Change in appetite - - -  Feeling bad or failure about yourself  - - -  Trouble concentrating - - -  Moving slowly or fidgety/restless - - -  Suicidal thoughts - - -  PHQ-9 Score - - -    History Shadell has a past medical history of Anxiety; Asthma; Depression; GERD (gastroesophageal reflux disease); Hyperlipidemia; Migraines; and Osteoarthritis.   She has a past surgical history that includes Cervical fusion (2015); Rhinoplasty; and Abdominal hysterectomy.   Her family history includes Arthritis in her father; Asthma in her mother; Heart disease in her father and mother; Hypertension in her sister.She reports that she has never smoked. She has never used smokeless tobacco. She reports that she does not drink alcohol or use drugs.    ROS Review of Systems  Constitutional: Negative for activity change, appetite change and fever.  HENT: Negative for congestion, rhinorrhea and sore throat.   Eyes: Negative for visual disturbance.  Respiratory: Negative for cough and shortness of breath.   Cardiovascular: Negative for chest pain and palpitations.  Gastrointestinal: Negative for abdominal pain, diarrhea and nausea.  Genitourinary: Negative for dysuria.  Musculoskeletal: Negative for arthralgias and  myalgias.    Objective:  BP 98/63   Pulse 85   Temp (!) 97.1 F (36.2 C) (Oral)   Ht  (1.575 m)   Wt 144 lb (65.3 kg)   BMI 26.34 kg/m   BP Readings from Last 3 Encounters:  10/10/16 98/63  07/11/16 93/65  04/09/16 94/66    Wt Readings from Last 3 Encounters:  10/10/16 144 lb (65.3 kg)  07/11/16 153 lb (69.4 kg)  04/09/16 176 lb (79.8 kg)     Physical Exam  Constitutional: She is oriented to person, place, and time. She appears well-developed and well-nourished. No distress.  Cardiovascular: Normal rate and regular rhythm.   Pulmonary/Chest: Breath sounds normal.  Neurological: She is alert and oriented to person, place, and time.  Skin: Skin is warm and dry.  Psychiatric: She has a normal mood and affect.      Assessment & Plan:   Damika was seen today for breast pain.  Diagnoses and all orders for this visit:  Mastalgia -     US BREAST COMPLETE UNI LEFT INC AXILLA; Future  Other orders -     ALPRAZolam (XANAX) 0.5 MG tablet; Take 1 tablet (0.5 mg total) by mouth 2 (two) times daily.       I am having Ms. Staubs maintain her Vitamin D, ondansetron, aspirin, rizatriptan, chlorzoxazone, EPINEPHrine, diclofenac sodium, budesonide-formoterol, omeprazole, montelukast, fluticasone, dicyclomine, promethazine, Coenzyme Q10, traZODone, acetaZOLAMIDE, cetirizine, estradiol, traMADol, rOPINIRole, rosuvastatin, VENTOLIN HFA, and ALPRAZolam.  Allergies as of 10/10/2016      Reactions  Pregabalin Shortness Of Breath   Diclofenac Sodium    Other reaction(s): Other Update   Sibutramine    Other reaction(s): Other update   Topiramate Other (See Comments)   Amitriptyline    Duloxetine Hcl    Guaifenesin Er    Oxybutynin    Gabapentin    Other reaction(s): Confusion   Propranolol Anxiety      Medication List       Accurate as of 10/10/16  4:42 PM. Always use your most recent med list.          acetaZOLAMIDE 500 MG capsule Commonly known as:   DIAMOX Take 500 mg by mouth 2 (two) times daily.   ALPRAZolam 0.5 MG tablet Commonly known as:  XANAX Take 1 tablet (0.5 mg total) by mouth 2 (two) times daily.   aspirin 325 MG EC tablet Take 1 tablet by mouth daily.   budesonide-formoterol 160-4.5 MCG/ACT inhaler Commonly known as:  SYMBICORT INHALE 2 PUFFS INTO THE LUNGS TWICE DAILY   cetirizine 10 MG tablet Commonly known as:  ZYRTEC Take 1 tablet (10 mg total) by mouth daily.   chlorzoxazone 500 MG tablet Commonly known as:  PARAFON Take 500 mg by mouth 3 (three) times daily.   Coenzyme Q10 200 MG capsule Take 200 mg by mouth daily.   diclofenac sodium 1 % Gel Commonly known as:  VOLTAREN   dicyclomine 10 MG capsule Commonly known as:  BENTYL Take 1 capsule by mouth daily before breakfast.   EPINEPHrine 0.3 mg/0.3 mL Soaj injection Commonly known as:  EPI-PEN Inject 0.3 mg into the muscle.   estradiol 0.5 MG tablet Commonly known as:  ESTRACE Take 1 tablet (0.5 mg total) by mouth daily.   fluticasone 50 MCG/ACT nasal spray Commonly known as:  FLONASE USE two sprays IN each nostril once daily   montelukast 10 MG tablet Commonly known as:  SINGULAIR TAKE 1 TABLET BY MOUTH AT BEDTIME   omeprazole 40 MG capsule Commonly known as:  PRILOSEC Take 1 capsule (40 mg total) by mouth daily.   ondansetron 8 MG tablet Commonly known as:  ZOFRAN   promethazine 12.5 MG tablet Commonly known as:  PHENERGAN TAKE 1 TO 2 TABLETS BY MOUTH EVERY 8 HOURS AS NEEDED FOR NAUSEA   rizatriptan 10 MG tablet Commonly known as:  MAXALT Take one tablet by mouth once as needed for Migraine. May repeat in 2hours if needed. Max 2 IN 24 hours.   rOPINIRole 2 MG tablet Commonly known as:  REQUIP Take 1 tablet (2 mg total) by mouth at bedtime.   rosuvastatin 10 MG tablet Commonly known as:  CRESTOR Take 1 tablet (10 mg total) by mouth daily.   traMADol 50 MG tablet Commonly known as:  ULTRAM Take 1 tablet (50 mg total) by  mouth every 6 (six) hours as needed.   traZODone 150 MG tablet Commonly known as:  DESYREL Use from 1/3 to 1 tablet nightly as needed for sleep.   VENTOLIN HFA 108 (90 Base) MCG/ACT inhaler Generic drug:  albuterol INHALE 2 PUFFS INTO THE LUNGS FOUR TIMES A DAY AS NEEDED FOR BREATHING   Vitamin D 2000 units Caps Take 2,000 Units by mouth daily.            Discharge Care Instructions        Start     Ordered   10/10/16 0000  ALPRAZolam (XANAX) 0.5 MG tablet  2 times daily    Comments:  Do not fill until  November 12, 2016   10/10/16 1546   10/10/16 0000  US BREAST COMPLETE UNI LEFT INC AXILLA    Question Answer Comment  Reason for Exam (SYMPTOM  OR DIAGNOSIS REQUIRED) sharp pain LUOQ with swelling in axilla   Preferred imaging location? Internal      10/10/16 1548       Follow-up: Return in about 3 months (around 01/09/2017) for Wellness.  Mechele Claude, M.D.

## 2016-10-13 ENCOUNTER — Encounter: Payer: Self-pay | Admitting: Family Medicine

## 2016-10-13 ENCOUNTER — Other Ambulatory Visit: Payer: Self-pay | Admitting: Family Medicine

## 2016-10-13 DIAGNOSIS — N63 Unspecified lump in unspecified breast: Secondary | ICD-10-CM

## 2016-10-22 ENCOUNTER — Encounter: Payer: Self-pay | Admitting: Family Medicine

## 2016-10-22 ENCOUNTER — Other Ambulatory Visit: Payer: Self-pay | Admitting: Family Medicine

## 2016-10-22 ENCOUNTER — Ambulatory Visit: Payer: 59

## 2016-10-22 MED ORDER — TRAMADOL HCL 50 MG PO TABS: 50.0000 mg | ORAL_TABLET | Freq: Four times a day (QID) | ORAL | 5 refills | Status: DC | PRN

## 2016-10-23 ENCOUNTER — Ambulatory Visit: Payer: 59 | Admitting: Family Medicine

## 2016-10-23 ENCOUNTER — Ambulatory Visit: Payer: 59

## 2016-10-23 ENCOUNTER — Encounter: Payer: Self-pay | Admitting: Family Medicine

## 2016-10-23 ENCOUNTER — Ambulatory Visit (INDEPENDENT_AMBULATORY_CARE_PROVIDER_SITE_OTHER): Payer: 59 | Admitting: Family Medicine

## 2016-10-23 VITALS — BP 91/60 | HR 71 | Temp 97.0°F | Ht 62.0 in | Wt 142.0 lb

## 2016-10-23 DIAGNOSIS — Z23 Encounter for immunization: Secondary | ICD-10-CM | POA: Diagnosis not present

## 2016-10-23 DIAGNOSIS — N6012 Diffuse cystic mastopathy of left breast: Secondary | ICD-10-CM

## 2016-10-23 DIAGNOSIS — G43709 Chronic migraine without aura, not intractable, without status migrainosus: Secondary | ICD-10-CM | POA: Diagnosis not present

## 2016-10-23 DIAGNOSIS — I959 Hypotension, unspecified: Secondary | ICD-10-CM

## 2016-10-23 MED ORDER — PREDNISONE 10 MG (21) PO TBPK: ORAL_TABLET | Freq: Every day | ORAL | 0 refills | Status: DC

## 2016-10-23 NOTE — Progress Notes (Signed)
Subjective:  Patient ID: Amy Gray, female    DOB: [birth year]  Age: 56 y.o. MRN: [id number]  CC: Follow-up (pt here today to review breast ultrasound )   HPI Amy Gray presents for Recheck of her left breast mass and discomfort. She says that it is still feeling puffy and sore. She expressed concern through email yesterday that it could be infected and was therefore asked to come in for reevaluation. Of note is that she had an ultrasound and mammogram with spot views of the area yesterday. Those were reviewed on care everywhere and report noted from stating that there was no sign of malignancy. the only abnormality was increased fibroglandular tissue. patient says that she has good range of motion of the left upper extremity it just feels sore today. there is been a little bit of swelling but no erythema. she's not had fever chills or sweats. additionally she is concerned that her blood pressures dropping a bit. her dose of migraine medicine has been increased and the intraventricular shunt had to be reopened in order to reduce her headaches. she continues to have daily migraines but they are a bit better. she reports being tired a lot recently  Depression screen [facility] 2/9 10/10/2016 07/11/2016 04/09/2016  Decreased Interest 0 0 0  Down, Depressed, Hopeless 0 0 0  PHQ - 2 Score 0 0 0  Altered sleeping - - -  Tired, decreased energy - - -  Change in appetite - - -  Feeling bad or failure about yourself  - - -  Trouble concentrating - - -  Moving slowly or fidgety/restless - - -  Suicidal thoughts - - -  PHQ-9 Score - - -    History Amy Gray has a past medical history of Anxiety; Asthma; Depression; GERD (gastroesophageal reflux disease); Hyperlipidemia; Migraines; and Osteoarthritis.   She has a past surgical history that includes Cervical fusion (2015); Rhinoplasty; and Abdominal hysterectomy.   Her family history includes Arthritis in her father; Asthma in her mother; Heart  disease in her father and mother; Hypertension in her sister.She reports that she has never smoked. She has never used smokeless tobacco. She reports that she does not drink alcohol or use drugs.    ROS Review of Systems  Constitutional: Positive for fatigue. Negative for activity change, appetite change and fever.  HENT: Negative for congestion, rhinorrhea and sore throat.   Eyes: Negative for visual disturbance.  Respiratory: Negative for cough and shortness of breath.   Cardiovascular: Negative for chest pain and palpitations.  Gastrointestinal: Negative for abdominal pain, diarrhea and nausea.  Genitourinary: Negative for dysuria.  Musculoskeletal: Negative for arthralgias and myalgias.    Objective:  BP 91/60   Pulse 71   Temp (!) 97 F (36.1 C) (Oral)   Ht  (1.575 m)   Wt 142 lb (64.4 kg)   BMI 25.97 kg/m   BP Readings from Last 3 Encounters:  10/23/16 91/60  10/10/16 98/63  07/11/16 93/65    Wt Readings from Last 3 Encounters:  10/23/16 142 lb (64.4 kg)  10/10/16 144 lb (65.3 kg)  07/11/16 153 lb (69.4 kg)     Physical Exam  Constitutional: She is oriented to person, place, and time. She appears well-developed and well-nourished. No distress.  Cardiovascular: Normal rate and regular rhythm.   Pulmonary/Chest: Breath sounds normal. Left breast exhibits tenderness (at left axillary tail and left upper quadrant with palpable fibrous tissue).  Neurological: She is alert and oriented to person, place,  and time.  Skin: Skin is warm and dry.  Psychiatric: She has a normal mood and affect.      Assessment & Plan:   Amy Gray was seen today for follow-up.  Diagnoses and all orders for this visit:  Fibrocystic disease of left breast -     predniSONE (STERAPRED UNI-PAK 21 TAB) 10 MG (21) TBPK tablet; Take by mouth daily. As directed x 6 days  Chronic migraine without aura without status migrainosus, not intractable  Hypotension, unspecified hypotension  type     Patient should increase the salt in her diet. Monitor for blood pressure climbing and swelling should she notice more swelling she'll need to back off a bit.   I am having Ms. Amy Gray start on predniSONE. I am also having her maintain her Vitamin D, ondansetron, aspirin, rizatriptan, chlorzoxazone, EPINEPHrine, diclofenac sodium, budesonide-formoterol, omeprazole, montelukast, fluticasone, dicyclomine, promethazine, Coenzyme Q10, traZODone, acetaZOLAMIDE, cetirizine, estradiol, rOPINIRole, rosuvastatin, VENTOLIN HFA, ALPRAZolam, traMADol, and Erenumab-aooe.  Allergies as of 10/23/2016      Reactions   Pregabalin Shortness Of Breath   Diclofenac Sodium    Other reaction(s): Other Update   Sibutramine    Other reaction(s): Other update   Topiramate Other (See Comments)   Amitriptyline    Duloxetine Hcl    Guaifenesin Er    Oxybutynin    Gabapentin    Other reaction(s): Confusion   Propranolol Anxiety      Medication List       Accurate as of 10/23/16 10:02 AM. Always use your most recent med list.          acetaZOLAMIDE 500 MG capsule Commonly known as:  DIAMOX Take 500 mg by mouth 2 (two) times daily.   ALPRAZolam 0.5 MG tablet Commonly known as:  XANAX Take 1 tablet (0.5 mg total) by mouth 2 (two) times daily.   aspirin 325 MG EC tablet Take 1 tablet by mouth daily.   budesonide-formoterol 160-4.5 MCG/ACT inhaler Commonly known as:  SYMBICORT INHALE 2 PUFFS INTO THE LUNGS TWICE DAILY   cetirizine 10 MG tablet Commonly known as:  ZYRTEC Take 1 tablet (10 mg total) by mouth daily.   chlorzoxazone 500 MG tablet Commonly known as:  PARAFON Take 500 mg by mouth 3 (three) times daily.   Coenzyme Q10 200 MG capsule Take 200 mg by mouth daily.   diclofenac sodium 1 % Gel Commonly known as:  VOLTAREN   dicyclomine 10 MG capsule Commonly known as:  BENTYL Take 1 capsule by mouth daily before breakfast.   EPINEPHrine 0.3 mg/0.3 mL Soaj  injection Commonly known as:  EPI-PEN Inject 0.3 mg into the muscle.   Erenumab-aooe 70 MG/ML Soaj Inject into the skin.   estradiol 0.5 MG tablet Commonly known as:  ESTRACE Take 1 tablet (0.5 mg total) by mouth daily.   fluticasone 50 MCG/ACT nasal spray Commonly known as:  FLONASE USE two sprays IN each nostril once daily   montelukast 10 MG tablet Commonly known as:  SINGULAIR TAKE 1 TABLET BY MOUTH AT BEDTIME   omeprazole 40 MG capsule Commonly known as:  PRILOSEC Take 1 capsule (40 mg total) by mouth daily.   ondansetron 8 MG tablet Commonly known as:  ZOFRAN   predniSONE 10 MG (21) Tbpk tablet Commonly known as:  STERAPRED UNI-PAK 21 TAB Take by mouth daily. As directed x 6 days   promethazine 12.5 MG tablet Commonly known as:  PHENERGAN TAKE 1 TO 2 TABLETS BY MOUTH EVERY 8 HOURS AS NEEDED FOR  NAUSEA   rizatriptan 10 MG tablet Commonly known as:  MAXALT Take one tablet by mouth once as needed for Migraine. May repeat in 2hours if needed. Max 2 IN 24 hours.   rOPINIRole 2 MG tablet Commonly known as:  REQUIP Take 1 tablet (2 mg total) by mouth at bedtime.   rosuvastatin 10 MG tablet Commonly known as:  CRESTOR Take 1 tablet (10 mg total) by mouth daily.   traMADol 50 MG tablet Commonly known as:  ULTRAM Take 1 tablet (50 mg total) by mouth every 6 (six) hours as needed.   traZODone 150 MG tablet Commonly known as:  DESYREL Use from 1/3 to 1 tablet nightly as needed for sleep.   VENTOLIN HFA 108 (90 Base) MCG/ACT inhaler Generic drug:  albuterol INHALE 2 PUFFS INTO THE LUNGS FOUR TIMES A DAY AS NEEDED FOR BREATHING   Vitamin D 2000 units Caps Take 2,000 Units by mouth daily.        Follow-up: Return if symptoms worsen or fail to improve.  Mechele Claude, M.D.

## 2016-11-07 ENCOUNTER — Other Ambulatory Visit: Payer: Self-pay | Admitting: Family Medicine

## 2016-11-20 ENCOUNTER — Telehealth: Payer: Self-pay | Admitting: Family Medicine

## 2016-11-20 NOTE — Telephone Encounter (Signed)
Pt aware.

## 2016-11-20 NOTE — Telephone Encounter (Signed)
Please contact the patient . The only blood pressure medicine she is taking that lowers blood pressure is her Diamox. She should call her neurologist to requested lowering her dosage if at all possible. I cannot authorize that though. It has to come from her shunt physician.

## 2016-11-20 NOTE — Telephone Encounter (Signed)
Patient states that she had several episodes where she felt very faint and nauseated so she checked her bp and it was 80/50 about 2 weeks ago. Yesterday it dropped to 80/50 also.

## 2016-11-24 ENCOUNTER — Encounter: Payer: Self-pay | Admitting: Family Medicine

## 2016-11-24 ENCOUNTER — Ambulatory Visit (INDEPENDENT_AMBULATORY_CARE_PROVIDER_SITE_OTHER): Payer: 59 | Admitting: Family Medicine

## 2016-11-24 VITALS — BP 94/65 | HR 91 | Temp 97.0°F | Ht 62.0 in | Wt 140.0 lb

## 2016-11-24 DIAGNOSIS — R42 Dizziness and giddiness: Secondary | ICD-10-CM | POA: Insufficient documentation

## 2016-11-24 DIAGNOSIS — I952 Hypotension due to drugs: Secondary | ICD-10-CM

## 2016-11-24 DIAGNOSIS — G43709 Chronic migraine without aura, not intractable, without status migrainosus: Secondary | ICD-10-CM

## 2016-11-24 DIAGNOSIS — G932 Benign intracranial hypertension: Secondary | ICD-10-CM | POA: Diagnosis not present

## 2016-11-24 MED ORDER — ROPINIROLE HCL 1 MG PO TABS: 1.0000 mg | ORAL_TABLET | Freq: Every day | ORAL | 0 refills | Status: DC

## 2016-11-24 NOTE — Progress Notes (Signed)
Subjective:  Patient ID: Amy Gray, female    DOB: [birth year]  Age: 56 y.o. MRN: [id number]  CC: Dizziness (pt here today c/o dizzy spells)   HPI Amy Gray presents for continuing dizzy spells.  These have been going on for about a month.  There is associated with feeling faint and queasy with chest tightness.  There is no room spinning there is no off-balance feeling.  She is lightheaded.  They seem to be increasing in frequency.  They are not associated with syncope.  Of note is that she has been under treatment for migraine, restless leg syndrome, and intracranial hypertension.  She has a shunt.  She has been taking multiple medications for these problems.  Of note is that she does not take the Bentyl regularly.  However she does take multiple medications that have dizziness as a common side effect.  Specifically Parafon, ropinirole, Diamox, alprazolam.  The Parafon helps with her headache.  She is taking it twice a day every day.  It is also noted to have a side effect of low blood pressure.  Several of these spells have been associated with blood pressure of 80/50.  Depression screen [facility] 2/9 11/24/2016 10/10/2016 07/11/2016  Decreased Interest 0 0 0  Down, Depressed, Hopeless 0 0 0  PHQ - 2 Score 0 0 0  Altered sleeping - - -  Tired, decreased energy - - -  Change in appetite - - -  Feeling bad or failure about yourself  - - -  Trouble concentrating - - -  Moving slowly or fidgety/restless - - -  Suicidal thoughts - - -  PHQ-9 Score - - -    History Amy Gray has a past medical history of Anxiety, Asthma, Depression, GERD (gastroesophageal reflux disease), Hyperlipidemia, Migraines, and Osteoarthritis.   She has a past surgical history that includes Cervical fusion (2015); Rhinoplasty; and Abdominal hysterectomy.   Her family history includes Arthritis in her father; Asthma in her mother; Heart disease in her father and mother; Hypertension in her sister.She reports that   has never smoked. she has never used smokeless tobacco. She reports that she does not drink alcohol or use drugs.    ROS Review of Systems  Constitutional: Negative for activity change, appetite change and fever.  HENT: Negative for congestion, rhinorrhea and sore throat.   Eyes: Negative for visual disturbance.  Respiratory: Negative for cough and shortness of breath.   Cardiovascular: Negative for chest pain and palpitations.  Gastrointestinal: Negative for abdominal pain, diarrhea and nausea.  Genitourinary: Negative for dysuria.  Musculoskeletal: Negative for arthralgias and myalgias.  Neurological: Positive for dizziness, light-headedness and headaches. Negative for seizures, syncope, facial asymmetry, speech difficulty, weakness and numbness.    Objective:  BP 94/65   Pulse 91   Temp (!) 97 F (36.1 C) (Oral)   Ht 5\' 2"  (1.575 m)   Wt 140 lb (63.5 kg)   BMI 25.61 kg/m   BP Readings from Last 3 Encounters:  11/24/16 94/65  10/23/16 91/60  10/10/16 98/63    Wt Readings from Last 3 Encounters:  11/24/16 140 lb (63.5 kg)  10/23/16 142 lb (64.4 kg)  10/10/16 144 lb (65.3 kg)     Physical Exam  Constitutional: She is oriented to person, place, and time. She appears well-developed and well-nourished. No distress.  HENT:  Head: Normocephalic and atraumatic.  Right Ear: External ear normal.  Left Ear: External ear normal.  Nose: Nose normal.  Mouth/Throat: Oropharynx is clear and  moist.  Eyes: Conjunctivae and EOM are normal. Pupils are equal, round, and reactive to light.  Neck: Normal range of motion. Neck supple. No thyromegaly present.  Cardiovascular: Normal rate, regular rhythm and normal heart sounds.  No murmur heard. Pulmonary/Chest: Effort normal and breath sounds normal. No respiratory distress. She has no wheezes. She has no rales.  Abdominal: Soft. Bowel sounds are normal. She exhibits no distension. There is no tenderness.  Lymphadenopathy:    She has  no cervical adenopathy.  Neurological: She is alert and oriented to person, place, and time. She has normal reflexes.  Skin: Skin is warm and dry.  Psychiatric: She has a normal mood and affect. Her behavior is normal. Judgment and thought content normal.      Assessment & Plan:   Amy Gray was seen today for dizziness.  Diagnoses and all orders for this visit:  Dizziness  Hypotension due to drugs  Chronic migraine without aura without status migrainosus, not intractable  IIH (idiopathic intracranial hypertension)  Other orders -     rOPINIRole (REQUIP) 1 MG tablet; Take 1 tablet (1 mg total) at bedtime by mouth.    I do believe that much of this is drug-induced.  As a trial she should cut her ropinirole and Diamox doses in half.  Additionally she should discontinue the Parafon completely.  Other medicines continue as is.  If need be she can follow-up with her headache clinic for a replacement for the Parafon.  Or here should the dizziness not improve with the revised  regimen.   I have discontinued Amy Gray's ondansetron, chlorzoxazone, fluticasone, dicyclomine, and predniSONE. I have also changed her rOPINIRole. Additionally, I am having her maintain her Vitamin D, aspirin, rizatriptan, EPINEPHrine, diclofenac sodium, omeprazole, montelukast, promethazine, Coenzyme Q10, traZODone, cetirizine, rosuvastatin, ALPRAZolam, traMADol, SYMBICORT, estradiol, VENTOLIN HFA, metoCLOPramide, acetaZOLAMIDE, and Erenumab-aooe.  Allergies as of 11/24/2016      Reactions   Pregabalin Shortness Of Breath   Diclofenac Sodium    Other reaction(s): Other Update   Sibutramine    Other reaction(s): Other update   Topiramate Other (See Comments)   Amitriptyline    Duloxetine Hcl    Guaifenesin Er    Oxybutynin    Gabapentin    Other reaction(s): Confusion   Propranolol Anxiety      Medication List        Accurate as of 11/24/16  7:58 PM. Always use your most recent med list.           acetaZOLAMIDE 250 MG tablet Commonly known as:  DIAMOX Take 125 mg 2 (two) times daily by mouth.   ALPRAZolam 0.5 MG tablet Commonly known as:  XANAX Take 1 tablet (0.5 mg total) by mouth 2 (two) times daily.   aspirin 325 MG EC tablet Take 1 tablet by mouth daily.   cetirizine 10 MG tablet Commonly known as:  ZYRTEC Take 1 tablet (10 mg total) by mouth daily.   Coenzyme Q10 200 MG capsule Take 200 mg by mouth daily.   diclofenac sodium 1 % Gel Commonly known as:  VOLTAREN   EPINEPHrine 0.3 mg/0.3 mL Soaj injection Commonly known as:  EPI-PEN Inject 0.3 mg into the muscle.   Erenumab-aooe 70 MG/ML Soaj Inject 140 mg into the skin.   estradiol 0.5 MG tablet Commonly known as:  ESTRACE Take 1 tablet (0.5 mg total) by mouth daily.   metoCLOPramide 10 MG tablet Commonly known as:  REGLAN Take 1 tab (10mg ) PO q4-6 hours PRN for nausea  montelukast 10 MG tablet Commonly known as:  SINGULAIR TAKE 1 TABLET BY MOUTH AT BEDTIME   omeprazole 40 MG capsule Commonly known as:  PRILOSEC Take 1 capsule (40 mg total) by mouth daily.   promethazine 12.5 MG tablet Commonly known as:  PHENERGAN TAKE 1 TO 2 TABLETS BY MOUTH EVERY 8 HOURS AS NEEDED FOR NAUSEA   rizatriptan 10 MG tablet Commonly known as:  MAXALT Take one tablet by mouth once as needed for Migraine. May repeat in 2hours if needed. Max 2 IN 24 hours.   rOPINIRole 1 MG tablet Commonly known as:  REQUIP Take 1 tablet (1 mg total) at bedtime by mouth.   rosuvastatin 10 MG tablet Commonly known as:  CRESTOR Take 1 tablet (10 mg total) by mouth daily.   SYMBICORT 160-4.5 MCG/ACT inhaler Generic drug:  budesonide-formoterol INHALE 2 PUFFS INTO THE LUNGS TWICE DAILY   traMADol 50 MG tablet Commonly known as:  ULTRAM Take 1 tablet (50 mg total) by mouth every 6 (six) hours as needed.   traZODone 150 MG tablet Commonly known as:  DESYREL Use from 1/3 to 1 tablet nightly as needed for sleep.   VENTOLIN  HFA 108 (90 Base) MCG/ACT inhaler Generic drug:  albuterol INHALE 2 PUFFS INTO THE LUNGS FOUR TIMES A DAY AS NEEDED FOR BREATHING   Vitamin D 2000 units Caps Take 2,000 Units by mouth daily.        Follow-up: Return in about 1 month (around 12/24/2016), or if symptoms worsen or fail to improve, for Dizziness and hypotension.  Mechele ClaudeWarren Maniya Donovan, M.D.

## 2016-12-09 ENCOUNTER — Other Ambulatory Visit: Payer: Self-pay | Admitting: Family Medicine

## 2016-12-29 ENCOUNTER — Encounter: Payer: 59 | Admitting: Family Medicine

## 2016-12-31 ENCOUNTER — Encounter: Payer: 59 | Admitting: Family Medicine

## 2017-01-08 ENCOUNTER — Other Ambulatory Visit: Payer: Self-pay | Admitting: Family Medicine

## 2017-01-08 ENCOUNTER — Other Ambulatory Visit: Payer: Self-pay | Admitting: Nurse Practitioner

## 2017-01-09 ENCOUNTER — Ambulatory Visit (INDEPENDENT_AMBULATORY_CARE_PROVIDER_SITE_OTHER): Payer: 59 | Admitting: Family Medicine

## 2017-01-09 ENCOUNTER — Ambulatory Visit (INDEPENDENT_AMBULATORY_CARE_PROVIDER_SITE_OTHER): Payer: 59

## 2017-01-09 ENCOUNTER — Encounter: Payer: Self-pay | Admitting: Family Medicine

## 2017-01-09 VITALS — BP 86/56 | HR 74 | Temp 97.6°F | Ht 62.0 in | Wt 141.0 lb

## 2017-01-09 DIAGNOSIS — I952 Hypotension due to drugs: Secondary | ICD-10-CM

## 2017-01-09 DIAGNOSIS — Z0000 Encounter for general adult medical examination without abnormal findings: Secondary | ICD-10-CM

## 2017-01-09 DIAGNOSIS — E782 Mixed hyperlipidemia: Secondary | ICD-10-CM

## 2017-01-09 DIAGNOSIS — M797 Fibromyalgia: Secondary | ICD-10-CM

## 2017-01-09 DIAGNOSIS — G43709 Chronic migraine without aura, not intractable, without status migrainosus: Secondary | ICD-10-CM

## 2017-01-09 DIAGNOSIS — E559 Vitamin D deficiency, unspecified: Secondary | ICD-10-CM

## 2017-01-09 DIAGNOSIS — G932 Benign intracranial hypertension: Secondary | ICD-10-CM

## 2017-01-09 DIAGNOSIS — F411 Generalized anxiety disorder: Secondary | ICD-10-CM

## 2017-01-09 LAB — URINALYSIS
BILIRUBIN UA: NEGATIVE
GLUCOSE, UA: NEGATIVE
KETONES UA: NEGATIVE
Leukocytes, UA: NEGATIVE
Nitrite, UA: NEGATIVE
PROTEIN UA: NEGATIVE
RBC, UA: NEGATIVE
SPEC GRAV UA: 1.01 (ref 1.005–1.030)
UUROB: 0.2 mg/dL (ref 0.2–1.0)
pH, UA: 5.5 (ref 5.0–7.5)

## 2017-01-09 MED ORDER — ROSUVASTATIN CALCIUM 10 MG PO TABS: 10.0000 mg | ORAL_TABLET | Freq: Every day | ORAL | 5 refills | Status: DC

## 2017-01-09 MED ORDER — OMEPRAZOLE 40 MG PO CPDR: 40.0000 mg | DELAYED_RELEASE_CAPSULE | Freq: Every day | ORAL | 11 refills | Status: DC

## 2017-01-09 MED ORDER — ALBUTEROL SULFATE HFA 108 (90 BASE) MCG/ACT IN AERS: INHALATION_SPRAY | RESPIRATORY_TRACT | 11 refills | Status: DC

## 2017-01-09 MED ORDER — TRAZODONE HCL 150 MG PO TABS: ORAL_TABLET | ORAL | 5 refills | Status: DC

## 2017-01-09 MED ORDER — ESTRADIOL 0.5 MG PO TABS: 0.5000 mg | ORAL_TABLET | Freq: Every day | ORAL | 0 refills | Status: DC

## 2017-01-09 NOTE — Progress Notes (Signed)
Subjective:  Patient ID: Amy Gray, female    DOB: [birth year]  Age: 56 y.o. MRN: [id number]  CC: Annual Exam (pt here today for CPE)   HPI Amy Gray presents for CPE  Depression screen [facility] 2/9 01/09/2017 11/24/2016 10/10/2016  Decreased Interest 0 0 0  Down, Depressed, Hopeless 0 0 0  PHQ - 2 Score 0 0 0  Altered sleeping - - -  Tired, decreased energy - - -  Change in appetite - - -  Feeling bad or failure about yourself  - - -  Trouble concentrating - - -  Moving slowly or fidgety/restless - - -  Suicidal thoughts - - -  PHQ-9 Score - - -    History [name] has a past medical history of Anxiety, Asthma, Depression, GERD (gastroesophageal reflux disease), Hyperlipidemia, Migraines, and Osteoarthritis.   She has a past surgical history that includes Cervical fusion (2015); Rhinoplasty; and Abdominal hysterectomy.   Her family history includes Arthritis in her father; Asthma in her mother; Heart disease in her father and mother; Hypertension in her sister.She reports that  has never smoked. she has never used smokeless tobacco. She reports that she does not drink alcohol or use drugs.    ROS Review of Systems  Constitutional: Negative for appetite change, chills, diaphoresis, fatigue, fever and unexpected weight change.  HENT: Negative for congestion, ear pain, hearing loss, postnasal drip, rhinorrhea, sneezing, sore throat and trouble swallowing.   Eyes: Negative for pain.  Respiratory: Negative for cough, chest tightness and shortness of breath.   Cardiovascular: Positive for chest pain (chest wall from fibbromyalgia syndrom). Negative for palpitations.  Gastrointestinal: Positive for nausea. Negative for abdominal pain, constipation, diarrhea and vomiting.  Endocrine: Negative for cold intolerance, heat intolerance, polydipsia, polyphagia and polyuria.  Genitourinary: Negative for dysuria, frequency and menstrual problem.  Musculoskeletal: Positive for  arthralgias, myalgias and neck pain. Negative for joint swelling.  Skin: Negative for rash.  Allergic/Immunologic: Negative for environmental allergies.  Neurological: Positive for headaches. Negative for dizziness, weakness and numbness.  Psychiatric/Behavioral: Negative for agitation and dysphoric mood.    Objective:  BP (!) 86/56   Pulse 74   Temp 97.6 F (36.4 C) (Oral)   Ht '5\' 2"'  (1.575 m)   Wt 141 lb (64 kg)   BMI 25.79 kg/m   BP Readings from Last 3 Encounters:  01/09/17 (!) 86/56  11/24/16 94/65  10/23/16 91/60    Wt Readings from Last 3 Encounters:  01/09/17 141 lb (64 kg)  11/24/16 140 lb (63.5 kg)  10/23/16 142 lb (64.4 kg)     Physical Exam  Constitutional: She is oriented to person, place, and time. She appears well-developed and well-nourished. No distress.  HENT:  Head: Normocephalic and atraumatic.  Right Ear: External ear normal.  Left Ear: External ear normal.  Nose: Nose normal.  Mouth/Throat: Oropharynx is clear and moist.  Eyes: Conjunctivae and EOM are normal. Pupils are equal, round, and reactive to light.  Neck: Normal range of motion. Neck supple. No thyromegaly present.  Cardiovascular: Normal rate, regular rhythm and normal heart sounds.  No murmur heard. Pulmonary/Chest: Effort normal and breath sounds normal. No respiratory distress. She has no wheezes. She has no rales. Right breast exhibits no inverted nipple, no mass and no tenderness. Left breast exhibits no inverted nipple, no mass and no tenderness. Breasts are symmetrical.  Abdominal: Soft. Normal appearance and bowel sounds are normal. She exhibits no distension, no abdominal bruit and no mass. There is  no splenomegaly or hepatomegaly. There is no tenderness. There is no tenderness at McBurney's point and negative Murphy's sign.  Musculoskeletal: Normal range of motion. She exhibits no edema or tenderness.  Lymphadenopathy:    She has no cervical adenopathy.  Neurological: She is  alert and oriented to person, place, and time. She has normal reflexes.  Skin: Skin is warm and dry. No rash noted.  Psychiatric: She has a normal mood and affect. Her behavior is normal. Judgment and thought content normal.      Assessment & Plan:   [name] was seen today for annual exam.  Diagnoses and all orders for this visit:  Well adult exam -     CBC with Differential/Platelet -     CMP14+EGFR -     DG Chest 2 View; Future  IIH (idiopathic intracranial hypertension)  Hypotension due to drugs  Fibromyalgia syndrome  Generalized anxiety disorder  Mixed hyperlipidemia -     Lipid panel -     Urinalysis  Chronic migraine without aura without status migrainosus, not intractable  Vitamin D deficiency -     VITAMIN D 25 Hydroxy (Vit-D Deficiency, Fractures)  Other orders -     omeprazole (PRILOSEC) 40 MG capsule; Take 1 capsule (40 mg total) by mouth daily. -     rosuvastatin (CRESTOR) 10 MG tablet; Take 1 tablet (10 mg total) by mouth daily. -     traZODone (DESYREL) 150 MG tablet; Use from 1/3 to 1 tablet nightly as needed for sleep. -     albuterol (VENTOLIN HFA) 108 (90 Base) MCG/ACT inhaler; INHALE 2 PUFFS INTO THE LUNGS FOUR TIMES A DAY AS NEEDED FOR BREATHING -     estradiol (ESTRACE) 0.5 MG tablet; Take 1 tablet (0.5 mg total) by mouth daily.       I have discontinued [name]'s montelukast and metoCLOPramide. I have also changed her VENTOLIN HFA to albuterol. Additionally, I am having her maintain her Vitamin D, aspirin, rizatriptan, EPINEPHrine, diclofenac sodium, Coenzyme Q10, cetirizine, ALPRAZolam, traMADol, SYMBICORT, acetaZOLAMIDE, Erenumab-aooe, rOPINIRole, LINZESS, omeprazole, rosuvastatin, traZODone, and estradiol.  Allergies as of 01/09/2017      Reactions   Pregabalin Shortness Of Breath   Diclofenac Sodium    Other reaction(s): Other Update   Sibutramine    Other reaction(s): Other update   Topiramate Other (See Comments)    Amitriptyline    Duloxetine Hcl    Guaifenesin Er    Oxybutynin    Gabapentin    Other reaction(s): Confusion   Propranolol Anxiety      Medication List        Accurate as of 01/09/17 11:59 PM. Always use your most recent med list.          acetaZOLAMIDE 250 MG tablet Commonly known as:  DIAMOX Take 125 mg 2 (two) times daily by mouth.   albuterol 108 (90 Base) MCG/ACT inhaler Commonly known as:  VENTOLIN HFA INHALE 2 PUFFS INTO THE LUNGS FOUR TIMES A DAY AS NEEDED FOR BREATHING   ALPRAZolam 0.5 MG tablet Commonly known as:  XANAX Take 1 tablet (0.5 mg total) by mouth 2 (two) times daily.   aspirin 325 MG EC tablet Take 1 tablet by mouth daily.   cetirizine 10 MG tablet Commonly known as:  ZYRTEC Take 1 tablet (10 mg total) by mouth daily.   Coenzyme Q10 200 MG capsule Take 200 mg by mouth daily.   diclofenac sodium 1 % Gel Commonly known as:  VOLTAREN   EPINEPHrine  0.3 mg/0.3 mL Soaj injection Commonly known as:  EPI-PEN Inject 0.3 mg into the muscle.   Erenumab-aooe 70 MG/ML Soaj Inject 140 mg into the skin.   estradiol 0.5 MG tablet Commonly known as:  ESTRACE Take 1 tablet (0.5 mg total) by mouth daily.   LINZESS 145 MCG Caps capsule Generic drug:  linaclotide   omeprazole 40 MG capsule Commonly known as:  PRILOSEC Take 1 capsule (40 mg total) by mouth daily.   rizatriptan 10 MG tablet Commonly known as:  MAXALT Take one tablet by mouth once as needed for Migraine. May repeat in 2hours if needed. Max 2 IN 24 hours.   rOPINIRole 1 MG tablet Commonly known as:  REQUIP Take 1 tablet (1 mg total) BY MOUTH at bedtime.   rosuvastatin 10 MG tablet Commonly known as:  CRESTOR Take 1 tablet (10 mg total) by mouth daily.   SYMBICORT 160-4.5 MCG/ACT inhaler Generic drug:  budesonide-formoterol INHALE 2 PUFFS INTO THE LUNGS TWICE DAILY   traMADol 50 MG tablet Commonly known as:  ULTRAM Take 1 tablet (50 mg total) by mouth every 6 (six) hours as  needed.   traZODone 150 MG tablet Commonly known as:  DESYREL Use from 1/3 to 1 tablet nightly as needed for sleep.   Vitamin D 2000 units Caps Take 2,000 Units by mouth daily.        Follow-up: Return in about 6 months (around 07/10/2017).  Claretta Fraise, M.D.

## 2017-01-10 LAB — VITAMIN D 25 HYDROXY (VIT D DEFICIENCY, FRACTURES): VIT D 25 HYDROXY: 35.6 ng/mL (ref 30.0–100.0)

## 2017-01-10 LAB — CBC WITH DIFFERENTIAL/PLATELET
Basophils Absolute: 0 10*3/uL (ref 0.0–0.2)
Basos: 0 %
EOS (ABSOLUTE): 0.1 10*3/uL (ref 0.0–0.4)
EOS: 2 %
HEMATOCRIT: 40 % (ref 34.0–46.6)
HEMOGLOBIN: 13.1 g/dL (ref 11.1–15.9)
Immature Grans (Abs): 0 10*3/uL (ref 0.0–0.1)
Immature Granulocytes: 0 %
LYMPHS ABS: 2.7 10*3/uL (ref 0.7–3.1)
Lymphs: 47 %
MCH: 29.9 pg (ref 26.6–33.0)
MCHC: 32.8 g/dL (ref 31.5–35.7)
MCV: 91 fL (ref 79–97)
MONOCYTES: 7 %
Monocytes Absolute: 0.4 10*3/uL (ref 0.1–0.9)
NEUTROS ABS: 2.6 10*3/uL (ref 1.4–7.0)
Neutrophils: 44 %
Platelets: 232 10*3/uL (ref 150–379)
RBC: 4.38 x10E6/uL (ref 3.77–5.28)
RDW: 13.6 % (ref 12.3–15.4)
WBC: 5.9 10*3/uL (ref 3.4–10.8)

## 2017-01-10 LAB — CMP14+EGFR
ALBUMIN: 4.1 g/dL (ref 3.5–5.5)
ALK PHOS: 108 IU/L (ref 39–117)
ALT: 21 IU/L (ref 0–32)
AST: 21 IU/L (ref 0–40)
Albumin/Globulin Ratio: 1.7 (ref 1.2–2.2)
BUN / CREAT RATIO: 36 — AB (ref 9–23)
BUN: 29 mg/dL — ABNORMAL HIGH (ref 6–24)
Bilirubin Total: <0.2 mg/dL (ref 0.0–1.2)
CO2: 21 mmol/L (ref 20–29)
CREATININE: 0.8 mg/dL (ref 0.57–1.00)
Calcium: 9.1 mg/dL (ref 8.7–10.2)
Chloride: 108 mmol/L — ABNORMAL HIGH (ref 96–106)
GFR calc non Af Amer: 83 mL/min/{1.73_m2} (ref >59)
GFR, EST AFRICAN AMERICAN: 95 mL/min/{1.73_m2} (ref >59)
GLOBULIN, TOTAL: 2.4 g/dL (ref 1.5–4.5)
Glucose: 69 mg/dL (ref 65–99)
Potassium: 3.6 mmol/L (ref 3.5–5.2)
SODIUM: 143 mmol/L (ref 134–144)
TOTAL PROTEIN: 6.5 g/dL (ref 6.0–8.5)

## 2017-01-10 LAB — LIPID PANEL
CHOL/HDL RATIO: 2.5 ratio (ref 0.0–4.4)
CHOLESTEROL TOTAL: 165 mg/dL (ref 100–199)
HDL: 67 mg/dL (ref >39)
LDL CALC: 77 mg/dL (ref 0–99)
Triglycerides: 105 mg/dL (ref 0–149)
VLDL Cholesterol Cal: 21 mg/dL (ref 5–40)

## 2017-01-15 ENCOUNTER — Encounter: Payer: Self-pay | Admitting: Family Medicine

## 2017-01-21 ENCOUNTER — Telehealth: Payer: Self-pay | Admitting: Family Medicine

## 2017-01-21 NOTE — Telephone Encounter (Signed)
Patient states that she reviewed labs on mychart and her potassium was on the low end of normal so patient started taking otc potassium 595mg  and it has helped her dizziness that she has been having on and off. Patient would also like you to review the rest of her insurance.

## 2017-02-05 ENCOUNTER — Encounter: Payer: Self-pay | Admitting: Family Medicine

## 2017-03-07 ENCOUNTER — Other Ambulatory Visit: Payer: Self-pay | Admitting: Family Medicine

## 2017-03-10 ENCOUNTER — Telehealth: Payer: Self-pay | Admitting: Family Medicine

## 2017-03-10 NOTE — Telephone Encounter (Signed)
Pt states she is going to see Dr Eulah PontMurphy on Friday (GI) and she plans to go back on the Bent[place] and just wanted you to be aware.

## 2017-05-04 ENCOUNTER — Other Ambulatory Visit: Payer: Self-pay | Admitting: Family Medicine

## 2017-05-09 ENCOUNTER — Other Ambulatory Visit: Payer: Self-pay | Admitting: Family Medicine

## 2017-05-13 ENCOUNTER — Telehealth: Payer: Self-pay | Admitting: Family Medicine

## 2017-05-13 NOTE — Telephone Encounter (Signed)
Apt made 5/3 with Dr. Darlyn ReadStacks

## 2017-05-22 ENCOUNTER — Ambulatory Visit: Payer: Self-pay | Admitting: Family Medicine

## 2017-05-25 ENCOUNTER — Encounter: Payer: Self-pay | Admitting: *Deleted

## 2017-06-02 ENCOUNTER — Encounter: Payer: Self-pay | Admitting: Family Medicine

## 2017-06-02 ENCOUNTER — Ambulatory Visit (INDEPENDENT_AMBULATORY_CARE_PROVIDER_SITE_OTHER): Payer: 59 | Admitting: Family Medicine

## 2017-06-02 VITALS — BP 95/63 | HR 69 | Temp 96.8°F | Ht 62.0 in | Wt 139.5 lb

## 2017-06-02 DIAGNOSIS — F411 Generalized anxiety disorder: Secondary | ICD-10-CM

## 2017-06-02 DIAGNOSIS — L299 Pruritus, unspecified: Secondary | ICD-10-CM

## 2017-06-02 DIAGNOSIS — G912 (Idiopathic) normal pressure hydrocephalus: Secondary | ICD-10-CM

## 2017-06-02 MED ORDER — BETAMETHASONE SOD PHOS & ACET 6 (3-3) MG/ML IJ SUSP
6.0000 mg | Freq: Once | INTRAMUSCULAR | Status: AC
  Administered 2017-06-02: 6 mg via INTRAMUSCULAR

## 2017-06-02 NOTE — Progress Notes (Signed)
Subjective:  Patient ID: Amy Gray, female    DOB: 1960/03/08  Age: 57 y.o. MRN: [id number]  CC: Pruritis (pt here today c/o itching all over her body )   HPI Amy Gray presents for itching all over.  It started a couple of months ago.  Is been increasing ever since.  Initially there were just a couple of small places on her legs.  They were described as small red plaques.  They were probably less than a centimeter each.  Shortly thereafter she noted another one on her abdomen.  Each of these has resolved spontaneously.  However the itching continued and in fact increased.  Over the same time she is also noted increasing dryness in her eyes.  This is been a chronic problem for her but she recently had to see her eye specialist for a increase in the symptoms well.  Of note is that she went to the beach and spent a great deal of time in the sun last week and this also exacerbated the itching.  She now experiences a lot of itching when she tries to shower as well.  She has no other known exposures.  She denies any form of rash or other eruption on the skin.  She denies any systemic symptoms such as shortness of breath other than her baseline which she has had for several years.  No chest pain or palpitations.  Depression screen [facility] 2/9 01/09/2017 11/24/2016 10/10/2016  Decreased Interest 0 0 0  Down, Depressed, Hopeless 0 0 0  PHQ - 2 Score 0 0 0  Altered sleeping - - -  Tired, decreased energy - - -  Change in appetite - - -  Feeling bad or failure about yourself  - - -  Trouble concentrating - - -  Moving slowly or fidgety/restless - - -  Suicidal thoughts - - -  PHQ-9 Score - - -    History Amy Gray has a past medical history of Anxiety, Asthma, Depression, GERD (gastroesophageal reflux disease), Hyperlipidemia, Migraines, and Osteoarthritis.   She has a past surgical history that includes Cervical fusion (2015); Rhinoplasty; and Abdominal hysterectomy.   Her family history  includes Arthritis in her father; Asthma in her mother; Heart disease in her father and mother; Hypertension in her sister.She reports that she has never smoked. She has never used smokeless tobacco. She reports that she does not drink alcohol or use drugs.    ROS Review of Systems  Constitutional: Negative.  Negative for fatigue and fever.  HENT: Negative.  Negative for congestion.   Eyes: Negative for visual disturbance.  Respiratory: Negative for shortness of breath.   Cardiovascular: Negative for chest pain.  Gastrointestinal: Negative for abdominal pain.  Musculoskeletal: Negative for arthralgias.  Skin: Negative for color change, pallor, rash and wound.  Neurological: Negative for dizziness and numbness.  Psychiatric/Behavioral: Negative for agitation and sleep disturbance. The patient is not nervous/anxious.     Objective:  BP 95/63   Pulse 69   Temp (!) 96.8 F (36 C) (Oral)   Ht 5' 2" (1.575 m)   Wt 139 lb 8 oz (63.3 kg)   BMI 25.51 kg/m   BP Readings from Last 3 Encounters:  06/02/17 95/63  01/09/17 (!) 86/56  11/24/16 94/65    Wt Readings from Last 3 Encounters:  06/02/17 139 lb 8 oz (63.3 kg)  01/09/17 141 lb (64 kg)  11/24/16 140 lb (63.5 kg)     Physical Exam  Constitutional: She is  oriented to person, place, and time. She appears well-developed and well-nourished. No distress.  Eyes: Pupils are equal, round, and reactive to light. EOM are normal.  Cardiovascular: Normal rate and regular rhythm.  Pulmonary/Chest: Breath sounds normal.  Musculoskeletal: Normal range of motion.  Neurological: She is alert and oriented to person, place, and time.  Skin: Skin is warm and dry. Capillary refill takes less than 2 seconds. No rash noted. No erythema.  Psychiatric: She has a normal mood and affect.      Assessment & Plan:   Amy Gray was seen today for pruritis.  Diagnoses and all orders for this visit:  Pruritus -     CBC with Differential/Platelet -      CMP14+EGFR -     Sedimentation rate -     betamethasone acetate-betamethasone sodium phosphate (CELESTONE) injection 6 mg  Normal pressure hydrocephalus  Generalized anxiety disorder       I am having Amy Gray maintain her Vitamin D, aspirin, rizatriptan, EPINEPHrine, diclofenac sodium, Coenzyme Q10, ALPRAZolam, traMADol, SYMBICORT, acetaZOLAMIDE, Erenumab-aooe, rOPINIRole, LINZESS, omeprazole, rosuvastatin, traZODone, albuterol, estradiol, cetirizine, carboxymethylcellulose, hydrOXYzine, PEPPERMINT OIL PO, ALIGN, and Propylene Glycol. We administered betamethasone acetate-betamethasone sodium phosphate.  Allergies as of 06/02/2017      Reactions   Pregabalin Shortness Of Breath   Chlorzoxazone Other (See Comments)   Decrease in BP    Diclofenac Sodium    Other reaction(s): Other Update   Metoclopramide Other (See Comments)   Restless legs   Sibutramine    Other reaction(s): Other update   Topiramate Other (See Comments)   Venlafaxine Other (See Comments)   Restless legs   Amitriptyline    Duloxetine Hcl    Guaifenesin Er    Oxybutynin    Gabapentin    Other reaction(s): Confusion   Propranolol Anxiety      Medication List        Accurate as of 06/02/17  6:03 PM. Always use your most recent med list.          acetaZOLAMIDE 250 MG tablet Commonly known as:  DIAMOX Take 125 mg 2 (two) times daily by mouth.   albuterol 108 (90 Base) MCG/ACT inhaler Commonly known as:  VENTOLIN HFA INHALE 2 PUFFS INTO THE LUNGS FOUR TIMES A DAY AS NEEDED FOR BREATHING   ALIGN 4 MG Caps   ALPRAZolam 0.5 MG tablet Commonly known as:  XANAX Take 1 tablet (0.5 mg total) by mouth 2 (two) times daily.   aspirin 325 MG EC tablet Take 1 tablet by mouth daily.   cetirizine 10 MG tablet Commonly known as:  ZYRTEC Take 1 tablet (10 mg total) by mouth daily.   Coenzyme Q10 200 MG capsule Take 200 mg by mouth daily.   diclofenac sodium 1 % Gel Commonly known as:   VOLTAREN   EPINEPHrine 0.3 mg/0.3 mL Soaj injection Commonly known as:  EPI-PEN Inject 0.3 mg into the muscle.   Erenumab-aooe 70 MG/ML Soaj Inject 140 mg into the skin.   estradiol 0.5 MG tablet Commonly known as:  ESTRACE Take 1 tablet (0.5 mg total) by mouth daily.   hydrOXYzine 10 MG tablet Commonly known as:  ATARAX/VISTARIL   LINZESS 145 MCG Caps capsule Generic drug:  linaclotide   omeprazole 40 MG capsule Commonly known as:  PRILOSEC Take 1 capsule (40 mg total) by mouth daily.   PEPPERMINT OIL PO   REFRESH TEARS 0.5 % Soln Generic drug:  carboxymethylcellulose 1 drop 3 (three) times a day as needed.  rizatriptan 10 MG tablet Commonly known as:  MAXALT Take one tablet by mouth once as needed for Migraine. May repeat in 2hours if needed. Max 2 IN 24 hours.   rOPINIRole 1 MG tablet Commonly known as:  REQUIP Take 1 tablet (1 mg total) BY MOUTH at bedtime.   rosuvastatin 10 MG tablet Commonly known as:  CRESTOR Take 1 tablet (10 mg total) by mouth daily.   SYMBICORT 160-4.5 MCG/ACT inhaler Generic drug:  budesonide-formoterol INHALE 2 PUFFS INTO THE LUNGS TWICE DAILY   SYSTANE COMPLETE 0.6 % Soln Generic drug:  Propylene Glycol Apply to eye.   traMADol 50 MG tablet Commonly known as:  ULTRAM Take 1 tablet (50 mg total) by mouth every 6 (six) hours as needed.   traZODone 150 MG tablet Commonly known as:  DESYREL Use from 1/3 to 1 tablet nightly as needed for sleep.   Vitamin D 2000 units Caps Take 2,000 Units by mouth daily.      No definite source of her symptoms could be determined.  Medication review showed that Diamox was the most likely medication to cause pruritus.  Specifically pruritus without rash.  The patient is reluctant to discontinue the medication due to its role in treating her NPH.  It helps her with her headaches.  She is willing to contact her neurologist.  In the meantime see if we can clear the rash with a cortisone injection.   Should it clear the itching she will not need to discontinue the Diamox.  Unfortunately should the relief be temporary then she needs to give consideration to the medication as a source and discussion with her neurologist of alternative medications.  Follow-up: No follow-ups on file.   , M.D. 

## 2017-06-03 LAB — CBC WITH DIFFERENTIAL/PLATELET
BASOS: 0 %
Basophils Absolute: 0 10*3/uL (ref 0.0–0.2)
EOS (ABSOLUTE): 0 10*3/uL (ref 0.0–0.4)
Eos: 0 %
HEMATOCRIT: 42.3 % (ref 34.0–46.6)
Hemoglobin: 14 g/dL (ref 11.1–15.9)
IMMATURE GRANULOCYTES: 0 %
Immature Grans (Abs): 0 10*3/uL (ref 0.0–0.1)
LYMPHS ABS: 2.1 10*3/uL (ref 0.7–3.1)
Lymphs: 23 %
MCH: 30.3 pg (ref 26.6–33.0)
MCHC: 33.1 g/dL (ref 31.5–35.7)
MCV: 92 fL (ref 79–97)
Monocytes Absolute: 0.9 10*3/uL (ref 0.1–0.9)
Monocytes: 10 %
NEUTROS PCT: 67 %
Neutrophils Absolute: 6.4 10*3/uL (ref 1.4–7.0)
PLATELETS: 264 10*3/uL (ref 150–379)
RBC: 4.62 x10E6/uL (ref 3.77–5.28)
RDW: 14.4 % (ref 12.3–15.4)
WBC: 9.5 10*3/uL (ref 3.4–10.8)

## 2017-06-03 LAB — CMP14+EGFR
ALBUMIN: 4.6 g/dL (ref 3.5–5.5)
ALT: 19 IU/L (ref 0–32)
AST: 16 IU/L (ref 0–40)
Albumin/Globulin Ratio: 1.7 (ref 1.2–2.2)
Alkaline Phosphatase: 106 IU/L (ref 39–117)
BUN / CREAT RATIO: 22 (ref 9–23)
BUN: 17 mg/dL (ref 6–24)
Bilirubin Total: 0.2 mg/dL (ref 0.0–1.2)
CALCIUM: 9.8 mg/dL (ref 8.7–10.2)
CO2: 20 mmol/L (ref 20–29)
Chloride: 108 mmol/L — ABNORMAL HIGH (ref 96–106)
Creatinine, Ser: 0.76 mg/dL (ref 0.57–1.00)
GFR calc Af Amer: 101 mL/min/{1.73_m2} (ref >59)
GFR, EST NON AFRICAN AMERICAN: 88 mL/min/{1.73_m2} (ref >59)
GLOBULIN, TOTAL: 2.7 g/dL (ref 1.5–4.5)
Glucose: 92 mg/dL (ref 65–99)
Potassium: 3.8 mmol/L (ref 3.5–5.2)
SODIUM: 143 mmol/L (ref 134–144)
Total Protein: 7.3 g/dL (ref 6.0–8.5)

## 2017-06-03 LAB — SEDIMENTATION RATE: Sed Rate: 6 mm/hr (ref 0–40)

## 2017-07-10 ENCOUNTER — Ambulatory Visit: Payer: 59 | Admitting: Family Medicine

## 2017-07-13 ENCOUNTER — Ambulatory Visit: Payer: 59 | Admitting: Family Medicine

## 2017-07-15 ENCOUNTER — Other Ambulatory Visit: Payer: Self-pay | Admitting: Family Medicine

## 2017-07-15 ENCOUNTER — Encounter: Payer: Self-pay | Admitting: Family Medicine

## 2017-07-15 ENCOUNTER — Telehealth: Payer: Self-pay | Admitting: Family Medicine

## 2017-07-15 ENCOUNTER — Ambulatory Visit (INDEPENDENT_AMBULATORY_CARE_PROVIDER_SITE_OTHER): Payer: 59 | Admitting: Family Medicine

## 2017-07-15 VITALS — BP 101/66 | HR 60 | Temp 97.5°F | Ht 62.0 in | Wt 143.5 lb

## 2017-07-15 DIAGNOSIS — F411 Generalized anxiety disorder: Secondary | ICD-10-CM | POA: Diagnosis not present

## 2017-07-15 DIAGNOSIS — G9520 Unspecified cord compression: Secondary | ICD-10-CM

## 2017-07-15 DIAGNOSIS — E782 Mixed hyperlipidemia: Secondary | ICD-10-CM

## 2017-07-15 DIAGNOSIS — G932 Benign intracranial hypertension: Secondary | ICD-10-CM

## 2017-07-15 DIAGNOSIS — E559 Vitamin D deficiency, unspecified: Secondary | ICD-10-CM

## 2017-07-15 DIAGNOSIS — M2550 Pain in unspecified joint: Secondary | ICD-10-CM | POA: Diagnosis not present

## 2017-07-15 DIAGNOSIS — G43709 Chronic migraine without aura, not intractable, without status migrainosus: Secondary | ICD-10-CM | POA: Diagnosis not present

## 2017-07-15 MED ORDER — ROPINIROLE HCL 1 MG PO TABS: 1.0000 mg | ORAL_TABLET | Freq: Every day | ORAL | 5 refills | Status: DC

## 2017-07-15 MED ORDER — ROSUVASTATIN CALCIUM 10 MG PO TABS: 10.0000 mg | ORAL_TABLET | Freq: Every day | ORAL | 5 refills | Status: DC

## 2017-07-15 MED ORDER — TRAMADOL HCL 50 MG PO TABS: 50.0000 mg | ORAL_TABLET | Freq: Four times a day (QID) | ORAL | 5 refills | Status: DC | PRN

## 2017-07-15 MED ORDER — PREDNISONE 10 MG PO TABS: ORAL_TABLET | ORAL | 0 refills | Status: DC

## 2017-07-15 MED ORDER — BUDESONIDE-FORMOTEROL FUMARATE 160-4.5 MCG/ACT IN AERO
2.0000 | INHALATION_SPRAY | Freq: Two times a day (BID) | RESPIRATORY_TRACT | 5 refills | Status: DC
Start: 2017-07-15 — End: 2018-06-25

## 2017-07-15 MED ORDER — CETIRIZINE HCL 10 MG PO TABS: 10.0000 mg | ORAL_TABLET | Freq: Every day | ORAL | 5 refills | Status: DC

## 2017-07-15 MED ORDER — ESTRADIOL 0.5 MG PO TABS: 0.5000 mg | ORAL_TABLET | Freq: Every day | ORAL | 5 refills | Status: DC

## 2017-07-15 MED ORDER — ALPRAZOLAM 0.5 MG PO TABS: 0.5000 mg | ORAL_TABLET | Freq: Two times a day (BID) | ORAL | 5 refills | Status: DC

## 2017-07-15 MED ORDER — TRAZODONE HCL 150 MG PO TABS: ORAL_TABLET | ORAL | 5 refills | Status: DC

## 2017-07-15 NOTE — Telephone Encounter (Signed)
Patient notified and verbalized understanding. 

## 2017-07-15 NOTE — Telephone Encounter (Signed)
Please contact the patient . Tell her the RF is on the arthritis panel I ordered.

## 2017-07-15 NOTE — Progress Notes (Addendum)
Subjective:  Patient ID: Amy Gray, female    DOB: 12-20-1960  Age: 57 y.o. MRN: [id number]  CC: Medical Management of Chronic Issues   HPI Smriti Barkow presents for follow-up on her fibromyalgia and chronic fatigue as well as joint pain.  She continues to have generalized anxiety symptoms.  She is back to off on her Xanax but it is due for refills.  With regard to the joint pain it is primarily in the hands and knees is worse in the morning and it takes her quite a while to work the stiffness out of her hands.  The knee pain limits her.  She is also having a lot of pain in the shoulders.  She is got a migraine now and has them frequently.  She had an ablation about 6 weeks ago to help with the neck pain.  Of she is unclear whether that was meant to help with the migraines or not.  She is also having bilateral sciatic pain.  She had injections this spring that did not help.  She continues to have a 6/10 pain on average widespread over the multiple areas of the body mentioned.  Depression screen [facility] 2/9 07/15/2017 01/09/2017 11/24/2016  Decreased Interest 0 0 0  Down, Depressed, Hopeless 0 0 0  PHQ - 2 Score 0 0 0  Altered sleeping - - -  Tired, decreased energy - - -  Change in appetite - - -  Feeling bad or failure about yourself  - - -  Trouble concentrating - - -  Moving slowly or fidgety/restless - - -  Suicidal thoughts - - -  PHQ-9 Score - - -    History Christe has a past medical history of Anxiety, Asthma, Depression, GERD (gastroesophageal reflux disease), Hyperlipidemia, Migraines, and Osteoarthritis.   She has a past surgical history that includes Cervical fusion (2015); Rhinoplasty; and Abdominal hysterectomy.   Her family history includes Arthritis in her father; Asthma in her mother; Heart disease in her father and mother; Hypertension in her sister.She reports that she has never smoked. She has never used smokeless tobacco. She reports that she does not drink  alcohol or use drugs.    ROS Review of Systems  Constitutional: Positive for fatigue. Negative for activity change, appetite change, diaphoresis, fever and unexpected weight change.  HENT: Negative for congestion.   Eyes: Positive for photophobia (With migraines). Negative for visual disturbance.  Respiratory: Negative for shortness of breath.   Cardiovascular: Negative for chest pain.  Gastrointestinal: Negative for abdominal pain, constipation, diarrhea, nausea and vomiting.  Genitourinary: Negative for difficulty urinating.  Musculoskeletal: Positive for arthralgias, joint swelling, myalgias, neck pain and neck stiffness.  Neurological: Positive for headaches (Migraines more frequent.  Under care of neurology.).  Psychiatric/Behavioral: Negative for sleep disturbance. The patient is nervous/anxious.     Objective:  BP 101/66   Pulse 60   Temp (!) 97.5 F (36.4 C) (Oral)   Ht 5' 2" (1.575 m)   Wt 143 lb 8 oz (65.1 kg)   BMI 26.25 kg/m   BP Readings from Last 3 Encounters:  07/15/17 101/66  06/02/17 95/63  01/09/17 (!) 86/56    Wt Readings from Last 3 Encounters:  07/15/17 143 lb 8 oz (65.1 kg)  06/02/17 139 lb 8 oz (63.3 kg)  01/09/17 141 lb (64 kg)     Physical Exam  Constitutional: She is oriented to person, place, and time. She appears well-developed and well-nourished. No distress.  HENT:  Head:  Normocephalic and atraumatic.  Eyes: Pupils are equal, round, and reactive to light. Conjunctivae are normal.  Neck: Normal range of motion. Neck supple. No thyromegaly present.  Cardiovascular: Normal rate, regular rhythm and normal heart sounds.  No murmur heard. Pulmonary/Chest: Effort normal and breath sounds normal. No respiratory distress. She has no wheezes. She has no rales.  Abdominal: Soft. Bowel sounds are normal. She exhibits no distension. There is no tenderness.  Musculoskeletal: Normal range of motion. She exhibits no edema or deformity.    Lymphadenopathy:    She has no cervical adenopathy.  Neurological: She is alert and oriented to person, place, and time.  Skin: Skin is warm and dry.  Psychiatric: She has a normal mood and affect. Her behavior is normal. Judgment and thought content normal.      Assessment & Plan:   Tram was seen today for medical management of chronic issues.  Diagnoses and all orders for this visit:  IIH (idiopathic intracranial hypertension)  Generalized anxiety disorder  Mixed hyperlipidemia -     CBC with Differential/Platelet -     CMP14+EGFR -     Lipid panel  Chronic migraine without aura without status migrainosus, not intractable  Cervical spinal cord compression (HCC)  Arthralgia, unspecified joint -     Ambulatory referral to Rheumatology -     Arthritis Panel  Vitamin D deficiency -     VITAMIN D 25 Hydroxy (Vit-D Deficiency, Fractures)  Other orders -     predniSONE (DELTASONE) 10 MG tablet; Take 5 daily for 3 days followed by 4,3,2 and 1 for 3 days each. -     cetirizine (ZYRTEC) 10 MG tablet; Take 1 tablet (10 mg total) by mouth daily. -     estradiol (ESTRACE) 0.5 MG tablet; Take 1 tablet (0.5 mg total) by mouth daily. -     rOPINIRole (REQUIP) 1 MG tablet; Take 1 tablet (1 mg total) by mouth at bedtime. -     rosuvastatin (CRESTOR) 10 MG tablet; Take 1 tablet (10 mg total) by mouth daily. -     budesonide-formoterol (SYMBICORT) 160-4.5 MCG/ACT inhaler; Inhale 2 puffs into the lungs 2 (two) times daily. -     traZODone (DESYREL) 150 MG tablet; Use from 1/3 to 1 tablet nightly as needed for sleep. -     ALPRAZolam (XANAX) 0.5 MG tablet; Take 1 tablet (0.5 mg total) by mouth 2 (two) times daily. -     traMADol (ULTRAM) 50 MG tablet; Take 1 tablet (50 mg total) by mouth every 6 (six) hours as needed.       I have discontinued Jocabed Desanto's Erenumab-aooe, carboxymethylcellulose, and PEPPERMINT OIL PO. I have also changed her SYMBICORT to  budesonide-formoterol. Additionally, I am having her start on predniSONE. Lastly, I am having her maintain her Vitamin D, aspirin, rizatriptan, EPINEPHrine, diclofenac sodium, Coenzyme Q10, acetaZOLAMIDE, LINZESS, omeprazole, albuterol, hydrOXYzine, ALIGN, Propylene Glycol, BOTOX, Peppermint Oil, RESTASIS, Galcanezumab-gnlm, promethazine, cetirizine, estradiol, rOPINIRole, rosuvastatin, traZODone, ALPRAZolam, and traMADol.  Allergies as of 07/15/2017      Reactions   Pregabalin Shortness Of Breath   Chlorzoxazone Other (See Comments)   Decrease in BP    Diclofenac Sodium    Other reaction(s): Other Update   Metoclopramide Other (See Comments)   Restless legs   Sibutramine    Other reaction(s): Other update   Topiramate Other (See Comments)   Venlafaxine Other (See Comments)   Restless legs   Amitriptyline    Duloxetine Hcl    Guaifenesin  Er    Oxybutynin    Gabapentin    Other reaction(s): Confusion   Propranolol Anxiety      Medication List        Accurate as of 07/15/17 11:41 AM. Always use your most recent med list.          acetaZOLAMIDE 250 MG tablet Commonly known as:  DIAMOX Take 125 mg 2 (two) times daily by mouth.   albuterol 108 (90 Base) MCG/ACT inhaler Commonly known as:  VENTOLIN HFA INHALE 2 PUFFS INTO THE LUNGS FOUR TIMES A DAY AS NEEDED FOR BREATHING   ALIGN 4 MG Caps   ALPRAZolam 0.5 MG tablet Commonly known as:  XANAX Take 1 tablet (0.5 mg total) by mouth 2 (two) times daily.   aspirin 325 MG EC tablet Take 1 tablet by mouth daily.   BOTOX 200 units Solr Generic drug:  Botulinum Toxin Type A   budesonide-formoterol 160-4.5 MCG/ACT inhaler Commonly known as:  SYMBICORT Inhale 2 puffs into the lungs 2 (two) times daily.   cetirizine 10 MG tablet Commonly known as:  ZYRTEC Take 1 tablet (10 mg total) by mouth daily.   Coenzyme Q10 200 MG capsule Take 200 mg by mouth daily.   diclofenac sodium 1 % Gel Commonly known as:  VOLTAREN    EMGALITY 120 MG/ML Soaj Generic drug:  Galcanezumab-gnlm Inject into the skin.   EPINEPHrine 0.3 mg/0.3 mL Soaj injection Commonly known as:  EPI-PEN Inject 0.3 mg into the muscle.   estradiol 0.5 MG tablet Commonly known as:  ESTRACE Take 1 tablet (0.5 mg total) by mouth daily.   hydrOXYzine 10 MG tablet Commonly known as:  ATARAX/VISTARIL   IBGARD 90 MG Cpcr Generic drug:  Peppermint Oil   LINZESS 145 MCG Caps capsule Generic drug:  linaclotide   omeprazole 40 MG capsule Commonly known as:  PRILOSEC Take 1 capsule (40 mg total) by mouth daily.   predniSONE 10 MG tablet Commonly known as:  DELTASONE Take 5 daily for 3 days followed by 4,3,2 and 1 for 3 days each.   promethazine 12.5 MG tablet Commonly known as:  PHENERGAN Take 12.5 mg by mouth every 6 (six) hours as needed for nausea or vomiting.   RESTASIS 0.05 % ophthalmic emulsion Generic drug:  cycloSPORINE   rizatriptan 10 MG tablet Commonly known as:  MAXALT Take one tablet by mouth once as needed for Migraine. May repeat in 2hours if needed. Max 2 IN 24 hours.   rOPINIRole 1 MG tablet Commonly known as:  REQUIP Take 1 tablet (1 mg total) by mouth at bedtime.   rosuvastatin 10 MG tablet Commonly known as:  CRESTOR Take 1 tablet (10 mg total) by mouth daily.   SYSTANE COMPLETE 0.6 % Soln Generic drug:  Propylene Glycol Apply to eye.   traMADol 50 MG tablet Commonly known as:  ULTRAM Take 1 tablet (50 mg total) by mouth every 6 (six) hours as needed.   traZODone 150 MG tablet Commonly known as:  DESYREL Use from 1/3 to 1 tablet nightly as needed for sleep.   Vitamin D 2000 units Caps Take 2,000 Units by mouth daily.        Follow-up: Return in about 3 months (around 10/15/2017).  Claretta Fraise, M.D.

## 2017-07-15 NOTE — Addendum Note (Signed)
Addended by: Mechele ClaudeSTACKS, Versie Fleener on: 07/15/2017 11:41 AM   Modules accepted: Orders

## 2017-07-16 LAB — CBC WITH DIFFERENTIAL/PLATELET
BASOS: 1 %
Basophils Absolute: 0 10*3/uL (ref 0.0–0.2)
EOS (ABSOLUTE): 0.1 10*3/uL (ref 0.0–0.4)
EOS: 3 %
HEMATOCRIT: 38.9 % (ref 34.0–46.6)
Hemoglobin: 12.8 g/dL (ref 11.1–15.9)
IMMATURE GRANULOCYTES: 0 %
Immature Grans (Abs): 0 10*3/uL (ref 0.0–0.1)
Lymphocytes Absolute: 1.9 10*3/uL (ref 0.7–3.1)
Lymphs: 43 %
MCH: 30 pg (ref 26.6–33.0)
MCHC: 32.9 g/dL (ref 31.5–35.7)
MCV: 91 fL (ref 79–97)
Monocytes Absolute: 0.5 10*3/uL (ref 0.1–0.9)
Monocytes: 12 %
NEUTROS ABS: 1.8 10*3/uL (ref 1.4–7.0)
NEUTROS PCT: 41 %
Platelets: 217 10*3/uL (ref 150–450)
RBC: 4.26 x10E6/uL (ref 3.77–5.28)
RDW: 12.9 % (ref 12.3–15.4)
WBC: 4.3 10*3/uL (ref 3.4–10.8)

## 2017-07-16 LAB — CMP14+EGFR
ALBUMIN: 4.4 g/dL (ref 3.5–5.5)
ALT: 15 IU/L (ref 0–32)
AST: 15 IU/L (ref 0–40)
Albumin/Globulin Ratio: 1.8 (ref 1.2–2.2)
Alkaline Phosphatase: 93 IU/L (ref 39–117)
BUN / CREAT RATIO: 14 (ref 9–23)
BUN: 14 mg/dL (ref 6–24)
Bilirubin Total: 0.4 mg/dL (ref 0.0–1.2)
CALCIUM: 9.3 mg/dL (ref 8.7–10.2)
CHLORIDE: 111 mmol/L — AB (ref 96–106)
CO2: 21 mmol/L (ref 20–29)
CREATININE: 0.99 mg/dL (ref 0.57–1.00)
GFR, EST AFRICAN AMERICAN: 73 mL/min/{1.73_m2} (ref >59)
GFR, EST NON AFRICAN AMERICAN: 63 mL/min/{1.73_m2} (ref >59)
Globulin, Total: 2.4 g/dL (ref 1.5–4.5)
Glucose: 82 mg/dL (ref 65–99)
Potassium: 3.9 mmol/L (ref 3.5–5.2)
Sodium: 147 mmol/L — ABNORMAL HIGH (ref 134–144)
Total Protein: 6.8 g/dL (ref 6.0–8.5)

## 2017-07-16 LAB — LIPID PANEL
Chol/HDL Ratio: 2.4 ratio (ref 0.0–4.4)
Cholesterol, Total: 170 mg/dL (ref 100–199)
HDL: 71 mg/dL (ref >39)
LDL CALC: 81 mg/dL (ref 0–99)
Triglycerides: 91 mg/dL (ref 0–149)
VLDL CHOLESTEROL CAL: 18 mg/dL (ref 5–40)

## 2017-07-16 LAB — VITAMIN D 25 HYDROXY (VIT D DEFICIENCY, FRACTURES): VIT D 25 HYDROXY: 46.9 ng/mL (ref 30.0–100.0)

## 2017-07-18 LAB — URIC A+ESR-WES+RA QN
Rhuematoid fact SerPl-aCnc: <10 IU/mL (ref 0.0–13.9)
URIC ACID: 4.2 mg/dL (ref 2.5–7.1)

## 2017-07-18 LAB — SPECIMEN STATUS REPORT

## 2017-10-16 ENCOUNTER — Encounter: Payer: Self-pay | Admitting: Family Medicine

## 2017-10-16 ENCOUNTER — Ambulatory Visit (INDEPENDENT_AMBULATORY_CARE_PROVIDER_SITE_OTHER): Payer: 59 | Admitting: Family Medicine

## 2017-10-16 VITALS — BP 92/59 | HR 73 | Temp 97.0°F | Ht 62.0 in | Wt 144.0 lb

## 2017-10-16 DIAGNOSIS — G43709 Chronic migraine without aura, not intractable, without status migrainosus: Secondary | ICD-10-CM

## 2017-10-16 DIAGNOSIS — M797 Fibromyalgia: Secondary | ICD-10-CM | POA: Diagnosis not present

## 2017-10-16 DIAGNOSIS — E782 Mixed hyperlipidemia: Secondary | ICD-10-CM | POA: Diagnosis not present

## 2017-10-16 DIAGNOSIS — G912 (Idiopathic) normal pressure hydrocephalus: Secondary | ICD-10-CM

## 2017-10-16 NOTE — Progress Notes (Signed)
Subjective:  Patient ID: Amy Gray, female    DOB: [birth year]  Age: 57 y.o. MRN: [id number]  CC: Medical Management of Chronic Issues (pt here today for routine follow up of her chronic medical conditions and also c/o what she thinks is allergy/sinus drainage)   HPI Amy Gray presents for follow-up of her chronic pains.  She is treated for chronic migraine.  She has fibromyalgia as well.  She is concerned that the Xanax may be causing her headaches.  It is a known side effect.  Additionally she is having pain in both shoulders knees and hands.  She says she is freezing all the time.  She is scheduled to see rheumatologist within the next few weeks.  She is no longer taking any prednisone.  She has a neurologist that is following her for her headaches.  They are nearly daily.  She is trying to avoid taking any habit-forming medicine and she will occasionally take a tramadol but does not need refills because she has several bottles of that that is accumulated because she is so reluctant to take it.She has a shunt and placement in place due to normal pressure hydrocephalus.  And takes Diamox to help with that.  She has a neurologist giving her routine Botox injections with trigger point injections between times.  Patient in for follow-up of elevated cholesterol. Doing well without complaints on current medication.  Although she has joint pains those predate her use of Crestor and had been present chronically prior to that time.  Currently no chest pain, shortness of breath or other cardiovascular related symptoms noted.  Depression screen [facility] 2/9 07/15/2017 01/09/2017 11/24/2016  Decreased Interest 0 0 0  Down, Depressed, Hopeless 0 0 0  PHQ - 2 Score 0 0 0  Altered sleeping - - -  Tired, decreased energy - - -  Change in appetite - - -  Feeling bad or failure about yourself  - - -  Trouble concentrating - - -  Moving slowly or fidgety/restless - - -  Suicidal thoughts - - -  PHQ-9  Score - - -    History Amy Gray has a past medical history of Anxiety, Asthma, Depression, GERD (gastroesophageal reflux disease), Hyperlipidemia, Migraines, and Osteoarthritis.   She has a past surgical history that includes Cervical fusion (2015); Rhinoplasty; and Abdominal hysterectomy.   Her family history includes Arthritis in her father; Asthma in her mother; Heart disease in her father and mother; Hypertension in her sister.She reports that she has never smoked. She has never used smokeless tobacco. She reports that she does not drink alcohol or use drugs.    ROS Review of Systems  Constitutional: Negative.   HENT: Positive for congestion and ear pain (left).   Eyes: Negative for visual disturbance.  Respiratory: Negative for shortness of breath.   Cardiovascular: Negative for chest pain.  Gastrointestinal: Negative for abdominal pain, constipation, diarrhea, nausea and vomiting.  Genitourinary: Negative for difficulty urinating.  Musculoskeletal: Positive for arthralgias, myalgias and neck pain.  Neurological: Positive for headaches.  Psychiatric/Behavioral: Negative for sleep disturbance.    Objective:  BP (!) 92/59   Pulse 73   Temp (!) 97 F (36.1 C) (Oral)   Ht 5\' 2"  (1.575 m)   Wt 144 lb (65.3 kg)   BMI 26.34 kg/m   BP Readings from Last 3 Encounters:  10/16/17 (!) 92/59  07/15/17 101/66  06/02/17 95/63    Wt Readings from Last 3 Encounters:  10/16/17 144 lb (65.3 kg)  07/15/17 143 lb 8 oz (65.1 kg)  06/02/17 139 lb 8 oz (63.3 kg)     Physical Exam  Constitutional: She is oriented to person, place, and time. She appears well-developed and well-nourished. No distress.  HENT:  Head: Normocephalic and atraumatic.  Right Ear: Tympanic membrane, external ear and ear canal normal. No tenderness. No mastoid tenderness. Tympanic membrane is not injected, not erythematous, not retracted and not bulging. No middle ear effusion.  Left Ear: Tympanic membrane,  external ear and ear canal normal. No tenderness. No mastoid tenderness. Tympanic membrane is not injected, not erythematous, not retracted and not bulging.  No middle ear effusion.  Nose: Nose normal.  Mouth/Throat: Oropharynx is clear and moist and mucous membranes are normal. No posterior oropharyngeal erythema.  Eyes: Pupils are equal, round, and reactive to light. Conjunctivae and EOM are normal.  Neck: Normal range of motion. Neck supple. No thyromegaly present.  Cardiovascular: Normal rate, regular rhythm and normal heart sounds.  No murmur heard. Pulmonary/Chest: Effort normal and breath sounds normal. No respiratory distress. She has no wheezes. She has no rales.  Abdominal: Soft. Bowel sounds are normal. She exhibits no distension. There is no tenderness.  Lymphadenopathy:    She has no cervical adenopathy.  Neurological: She is alert and oriented to person, place, and time. She has normal reflexes.  Skin: Skin is warm and dry.  Psychiatric: She has a normal mood and affect. Her behavior is normal. Judgment and thought content normal.      Assessment & Plan:   Amy Gray was seen today for medical management of chronic issues.  Diagnoses and all orders for this visit:  Normal pressure hydrocephalus (HCC)  Chronic migraine without aura without status migrainosus, not intractable  Fibromyalgia syndrome  Mixed hyperlipidemia       I have discontinued Amy Gray's rizatriptan and predniSONE. I am also having her maintain her Vitamin D, aspirin, EPINEPHrine, diclofenac sodium, Coenzyme Q10, acetaZOLAMIDE, LINZESS, omeprazole, albuterol, hydrOXYzine, ALIGN, Propylene Glycol, BOTOX, Peppermint Oil, RESTASIS, Galcanezumab-gnlm, promethazine, cetirizine, estradiol, rOPINIRole, rosuvastatin, budesonide-formoterol, traZODone, ALPRAZolam, traMADol, and butalbital-acetaminophen-caffeine.  Allergies as of 10/16/2017      Reactions   Pregabalin Shortness Of Breath    Chlorzoxazone Other (See Comments)   Decrease in BP    Diclofenac Sodium    Other reaction(s): Other Update   Metoclopramide Other (See Comments)   Restless legs   Sibutramine    Other reaction(s): Other update   Topiramate Other (See Comments)   Venlafaxine Other (See Comments)   Restless legs   Amitriptyline    Duloxetine Hcl    Guaifenesin Er    Oxybutynin    Gabapentin    Other reaction(s): Confusion   Propranolol Anxiety      Medication List        Accurate as of 10/16/17 10:44 AM. Always use your most recent med list.          acetaZOLAMIDE 250 MG tablet Commonly known as:  DIAMOX Take 250 mg by mouth 2 (two) times daily.   albuterol 108 (90 Base) MCG/ACT inhaler Commonly known as:  PROVENTIL HFA;VENTOLIN HFA INHALE 2 PUFFS INTO THE LUNGS FOUR TIMES A DAY AS NEEDED FOR BREATHING   ALIGN 4 MG Caps   ALPRAZolam 0.5 MG tablet Commonly known as:  XANAX Take 1 tablet (0.5 mg total) by mouth 2 (two) times daily.   aspirin 325 MG EC tablet Take 1 tablet by mouth daily.   BOTOX 200 units Solr Generic drug:  Botulinum  Toxin Type A   budesonide-formoterol 160-4.5 MCG/ACT inhaler Commonly known as:  SYMBICORT Inhale 2 puffs into the lungs 2 (two) times daily.   butalbital-acetaminophen-caffeine 50-325-40 MG tablet Commonly known as:  FIORICET, ESGIC   cetirizine 10 MG tablet Commonly known as:  ZYRTEC Take 1 tablet (10 mg total) by mouth daily.   Coenzyme Q10 200 MG capsule Take 200 mg by mouth daily.   diclofenac sodium 1 % Gel Commonly known as:  VOLTAREN   EMGALITY 120 MG/ML Soaj Generic drug:  Galcanezumab-gnlm Inject into the skin.   EPINEPHrine 0.3 mg/0.3 mL Soaj injection Commonly known as:  EPI-PEN Inject 0.3 mg into the muscle.   estradiol 0.5 MG tablet Commonly known as:  ESTRACE Take 1 tablet (0.5 mg total) by mouth daily.   hydrOXYzine 10 MG tablet Commonly known as:  ATARAX/VISTARIL   IBGARD 90 MG Cpcr Generic drug:  Peppermint  Oil   LINZESS 145 MCG Caps capsule Generic drug:  linaclotide   omeprazole 40 MG capsule Commonly known as:  PRILOSEC Take 1 capsule (40 mg total) by mouth daily.   promethazine 12.5 MG tablet Commonly known as:  PHENERGAN Take 12.5 mg by mouth every 6 (six) hours as needed for nausea or vomiting.   RESTASIS 0.05 % ophthalmic emulsion Generic drug:  cycloSPORINE   rOPINIRole 1 MG tablet Commonly known as:  REQUIP Take 1 tablet (1 mg total) by mouth at bedtime.   rosuvastatin 10 MG tablet Commonly known as:  CRESTOR Take 1 tablet (10 mg total) by mouth daily.   SYSTANE COMPLETE 0.6 % Soln Generic drug:  Propylene Glycol Apply to eye.   traMADol 50 MG tablet Commonly known as:  ULTRAM Take 1 tablet (50 mg total) by mouth every 6 (six) hours as needed.   traZODone 150 MG tablet Commonly known as:  DESYREL Use from 1/3 to 1 tablet nightly as needed for sleep.   Vitamin D 2000 units Caps Take 2,000 Units by mouth daily.      Will hold off on medication changes and new therapies until after rheumatology evaluation  Follow-up: Return in about 3 months (around 01/15/2018) for Wellness.  Mechele Claude, M.D.

## 2017-10-16 NOTE — Patient Instructions (Signed)
Decrease your Xanax to 0.5mg  1 daily x 1 month, then 1/2 every other day alternating with 1 every other day x 2 weeks, then 1/2 daily x 2 weeks, then 1/2 every other day x 2weeks, then stop taking all together.

## 2017-12-11 ENCOUNTER — Other Ambulatory Visit: Payer: Self-pay | Admitting: Family Medicine

## 2017-12-14 NOTE — Telephone Encounter (Signed)
Last seen 10/16/17  Dr Stacks  

## 2018-01-09 ENCOUNTER — Other Ambulatory Visit: Payer: Self-pay | Admitting: Family Medicine

## 2018-01-15 ENCOUNTER — Ambulatory Visit (INDEPENDENT_AMBULATORY_CARE_PROVIDER_SITE_OTHER): Payer: 59 | Admitting: Family Medicine

## 2018-01-15 ENCOUNTER — Encounter: Payer: Self-pay | Admitting: Family Medicine

## 2018-01-15 VITALS — BP 104/70 | HR 77 | Temp 97.6°F | Ht 62.0 in | Wt 145.0 lb

## 2018-01-15 DIAGNOSIS — F411 Generalized anxiety disorder: Secondary | ICD-10-CM

## 2018-01-15 DIAGNOSIS — E782 Mixed hyperlipidemia: Secondary | ICD-10-CM | POA: Diagnosis not present

## 2018-01-15 DIAGNOSIS — Z23 Encounter for immunization: Secondary | ICD-10-CM

## 2018-01-15 DIAGNOSIS — Z0001 Encounter for general adult medical examination with abnormal findings: Secondary | ICD-10-CM

## 2018-01-15 DIAGNOSIS — G912 (Idiopathic) normal pressure hydrocephalus: Secondary | ICD-10-CM | POA: Diagnosis not present

## 2018-01-15 DIAGNOSIS — M797 Fibromyalgia: Secondary | ICD-10-CM

## 2018-01-15 DIAGNOSIS — G43709 Chronic migraine without aura, not intractable, without status migrainosus: Secondary | ICD-10-CM | POA: Diagnosis not present

## 2018-01-15 DIAGNOSIS — Z0000 Encounter for general adult medical examination without abnormal findings: Secondary | ICD-10-CM

## 2018-01-15 LAB — URINALYSIS
Bilirubin, UA: NEGATIVE
Glucose, UA: NEGATIVE
Ketones, UA: NEGATIVE
LEUKOCYTES UA: NEGATIVE
Nitrite, UA: NEGATIVE
Protein, UA: NEGATIVE
RBC, UA: NEGATIVE
Specific Gravity, UA: 1.02 (ref 1.005–1.030)
Urobilinogen, Ur: 0.2 mg/dL (ref 0.2–1.0)
pH, UA: 7.5 (ref 5.0–7.5)

## 2018-01-15 MED ORDER — ROSUVASTATIN CALCIUM 10 MG PO TABS: 10.0000 mg | ORAL_TABLET | Freq: Every day | ORAL | 5 refills | Status: DC

## 2018-01-15 MED ORDER — OMEPRAZOLE 40 MG PO CPDR: 40.0000 mg | DELAYED_RELEASE_CAPSULE | Freq: Every day | ORAL | 1 refills | Status: DC

## 2018-01-15 MED ORDER — ROPINIROLE HCL 1 MG PO TABS: 1.0000 mg | ORAL_TABLET | Freq: Every day | ORAL | 2 refills | Status: DC

## 2018-01-15 MED ORDER — ESTRADIOL 0.5 MG PO TABS: 0.5000 mg | ORAL_TABLET | Freq: Every day | ORAL | 1 refills | Status: DC

## 2018-01-15 MED ORDER — TRAZODONE HCL 150 MG PO TABS: ORAL_TABLET | ORAL | 1 refills | Status: DC

## 2018-01-15 NOTE — Progress Notes (Signed)
Subjective:  Patient ID: Amy Gray, female    DOB: 1960/08/28  Age: 57 y.o. MRN: [id number]  CC: Annual Exam   HPI Amy Gray presents for Annual exam. Needs breast exam without pap today. [facility]. She continues to have complex HA for which she uses botox, galcanezumab, that is, Emgality, tramadol and fioricet additionally she takes Diamox twice daily for her normal pressure hydrocephalus.  She has recently added hemp oil drops.  She still has frequent HA. Uses phenergan and compazine for relief. She has weaned off of xanax completely.   Patient in for follow-up of elevated cholesterol. Doing well without complaints on current medication. Denies side effects of statin including myalgia and arthralgia and nausea. Also in today for liver function testing. Currently no chest pain, shortness of breath or other cardiovascular related symptoms noted.  Depression screen [facility] 2/9 01/15/2018 07/15/2017 01/09/2017  Decreased Interest 0 0 0  Down, Depressed, Hopeless 0 0 0  PHQ - 2 Score 0 0 0  Altered sleeping - - -  Tired, decreased energy - - -  Change in appetite - - -  Feeling bad or failure about yourself  - - -  Trouble concentrating - - -  Moving slowly or fidgety/restless - - -  Suicidal thoughts - - -  PHQ-9 Score - - -    History Amy Gray has a past medical history of Anxiety, Asthma, Depression, GERD (gastroesophageal reflux disease), Hyperlipidemia, Migraines, and Osteoarthritis.   She has a past surgical history that includes Cervical fusion (2015); Rhinoplasty; and Abdominal hysterectomy.   Her family history includes Arthritis in her father; Asthma in her mother; Heart disease in her father and mother; Hypertension in her sister.She reports that she has never smoked. She has never used smokeless tobacco. She reports that she does not drink alcohol or use drugs.    ROS Review of Systems  Constitutional: Negative.  Negative for  appetite change, chills, diaphoresis, fatigue, fever and unexpected weight change.  HENT: Negative for congestion, ear pain, hearing loss, postnasal drip, rhinorrhea, sneezing, sore throat and trouble swallowing.   Eyes: Negative for pain and visual disturbance.  Respiratory: Negative for cough, chest tightness and shortness of breath.   Cardiovascular: Negative for chest pain and palpitations.  Gastrointestinal: Positive for constipation and nausea. Negative for abdominal pain, diarrhea and vomiting.  Endocrine: Negative for cold intolerance, heat intolerance, polydipsia, polyphagia and polyuria.  Genitourinary: Positive for dysuria (Mild and intermittent for about a week and a half). Negative for difficulty urinating, frequency and menstrual problem.  Musculoskeletal: Negative for arthralgias, joint swelling and myalgias.  Skin: Negative for rash.  Allergic/Immunologic: Negative for environmental allergies.  Neurological: Positive for headaches. Negative for dizziness, weakness and numbness.  Psychiatric/Behavioral: Negative for agitation, dysphoric mood and sleep disturbance.    Objective:  BP 104/70   Pulse 77   Temp 97.6 F (36.4 C) (Oral)   Ht 5' 2" (1.575 m)   Wt 145 lb (65.8 kg)   BMI 26.52 kg/m   BP Readings from Last 3 Encounters:  01/15/18 104/70  10/16/17 (!) 92/59  07/15/17 101/66    Wt Readings from Last 3 Encounters:  01/15/18 145 lb (65.8 kg)  10/16/17 144 lb (65.3 kg)  07/15/17 143 lb 8 oz (65.1 kg)     Physical Exam Constitutional:      General: She is not in acute distress.    Appearance: Normal appearance. She is well-developed.  HENT:  Head: Normocephalic and atraumatic.     Right Ear: External ear normal.     Left Ear: External ear normal.     Nose: Nose normal.  Eyes:     Conjunctiva/sclera: Conjunctivae normal.     Pupils: Pupils are equal, round, and reactive to light.  Neck:     Musculoskeletal: Normal range of motion and neck supple.      Thyroid: No thyromegaly.  Cardiovascular:     Rate and Rhythm: Normal rate and regular rhythm.     Heart sounds: Normal heart sounds. No murmur.  Pulmonary:     Effort: Pulmonary effort is normal. No respiratory distress.     Breath sounds: Normal breath sounds. No wheezing or rales.  Chest:     Breasts: Breasts are symmetrical.        Right: No inverted nipple, mass or tenderness.        Left: No inverted nipple, mass or tenderness.  Abdominal:     General: Bowel sounds are normal. There is no distension or abdominal bruit.     Palpations: Abdomen is soft. There is no hepatomegaly, splenomegaly or mass.     Tenderness: There is no abdominal tenderness. Negative signs include Murphy's sign and McBurney's sign.  Musculoskeletal: Normal range of motion.        General: No tenderness.  Lymphadenopathy:     Cervical: No cervical adenopathy.  Skin:    General: Skin is warm and dry.     Findings: No rash.  Neurological:     Mental Status: She is alert and oriented to person, place, and time.     Deep Tendon Reflexes: Reflexes are normal and symmetric.  Psychiatric:        Behavior: Behavior normal.        Thought Content: Thought content normal.        Judgment: Judgment normal.       Assessment & Plan:   Amy Gray was seen today for annual exam.  Diagnoses and all orders for this visit:  Annual physical exam -     CMP14+EGFR -     CBC with Differential/Platelet -     Urinalysis -     Urine Culture -     TSH -     VITAMIN D 25 Hydroxy (Vit-D Deficiency, Fractures)  Well adult exam  Normal pressure hydrocephalus (HCC)  Chronic migraine without aura without status migrainosus, not intractable  Fibromyalgia syndrome  Mixed hyperlipidemia  Generalized anxiety disorder  Other orders -     estradiol (ESTRACE) 0.5 MG tablet; Take 1 tablet (0.5 mg total) by mouth daily. -     omeprazole (PRILOSEC) 40 MG capsule; Take 1 capsule (40 mg total) by mouth daily. -      rOPINIRole (REQUIP) 1 MG tablet; Take 1 tablet (1 mg total) by mouth at bedtime. -     rosuvastatin (CRESTOR) 10 MG tablet; Take 1 tablet (10 mg total) by mouth daily. -     traZODone (DESYREL) 150 MG tablet; TAKE 1/3 (ONE-THIRD) to 1 (ONE) tablet BY MOUTH nightly as needed forsleep.       I have discontinued Amy Gray's hydrOXYzine and ALPRAZolam. I am also having her maintain her Vitamin D, aspirin, EPINEPHrine, diclofenac sodium, Coenzyme Q10, acetaZOLAMIDE, LINZESS, albuterol, ALIGN, Propylene Glycol, BOTOX, Peppermint Oil, RESTASIS, Galcanezumab-gnlm, promethazine, cetirizine, budesonide-formoterol, traMADol, butalbital-acetaminophen-caffeine, naproxen, prochlorperazine, estradiol, omeprazole, rOPINIRole, rosuvastatin, and traZODone.  Allergies as of 01/15/2018      Reactions   Pregabalin Shortness Of Breath  Chlorzoxazone Other (See Comments)   Decrease in BP    Diclofenac Sodium    Other reaction(s): Other Update   Metoclopramide Other (See Comments)   Restless legs   Sibutramine    Other reaction(s): Other update   Topiramate Other (See Comments)   Venlafaxine Other (See Comments)   Restless legs   Amitriptyline    Duloxetine Hcl    Guaifenesin Er    Oxybutynin    Gabapentin    Other reaction(s): Confusion   Propranolol Anxiety      Medication List       Accurate as of January 15, 2018  3:10 PM. Always use your most recent med list.        acetaZOLAMIDE 250 MG tablet Commonly known as:  DIAMOX Take 250 mg by mouth 2 (two) times daily.   albuterol 108 (90 Base) MCG/ACT inhaler Commonly known as:  VENTOLIN HFA INHALE 2 PUFFS INTO THE LUNGS FOUR TIMES A DAY AS NEEDED FOR BREATHING   ALIGN 4 MG Caps   aspirin 325 MG EC tablet Take 1 tablet by mouth daily.   BOTOX 200 units Solr Generic drug:  Botulinum Toxin Type A   budesonide-formoterol 160-4.5 MCG/ACT inhaler Commonly known as:  SYMBICORT Inhale 2 puffs into the lungs 2 (two) times daily.    butalbital-acetaminophen-caffeine 50-325-40 MG tablet Commonly known as:  FIORICET, ESGIC   cetirizine 10 MG tablet Commonly known as:  ZYRTEC Take 1 tablet (10 mg total) by mouth daily.   Coenzyme Q10 200 MG capsule Take 200 mg by mouth daily.   diclofenac sodium 1 % Gel Commonly known as:  VOLTAREN   EMGALITY 120 MG/ML Soaj Generic drug:  Galcanezumab-gnlm Inject into the skin.   EPINEPHrine 0.3 mg/0.3 mL Soaj injection Commonly known as:  EPI-PEN Inject 0.3 mg into the muscle.   estradiol 0.5 MG tablet Commonly known as:  ESTRACE Take 1 tablet (0.5 mg total) by mouth daily.   IBGARD 90 MG Cpcr Generic drug:  Peppermint Oil   LINZESS 145 MCG Caps capsule Generic drug:  linaclotide   naproxen 500 MG tablet Commonly known as:  NAPROSYN Take by mouth.   omeprazole 40 MG capsule Commonly known as:  PRILOSEC Take 1 capsule (40 mg total) by mouth daily.   prochlorperazine 10 MG tablet Commonly known as:  COMPAZINE Take 10 mg by mouth every 6 (six) hours as needed for nausea or vomiting.   promethazine 12.5 MG tablet Commonly known as:  PHENERGAN Take 12.5 mg by mouth every 6 (six) hours as needed for nausea or vomiting.   RESTASIS 0.05 % ophthalmic emulsion Generic drug:  cycloSPORINE   rOPINIRole 1 MG tablet Commonly known as:  REQUIP Take 1 tablet (1 mg total) by mouth at bedtime.   rosuvastatin 10 MG tablet Commonly known as:  CRESTOR Take 1 tablet (10 mg total) by mouth daily.   SYSTANE COMPLETE 0.6 % Soln Generic drug:  Propylene Glycol Apply to eye.   traMADol 50 MG tablet Commonly known as:  ULTRAM Take 1 tablet (50 mg total) by mouth every 6 (six) hours as needed.   traZODone 150 MG tablet Commonly known as:  DESYREL TAKE 1/3 (ONE-THIRD) to 1 (ONE) tablet BY MOUTH nightly as needed forsleep.   Vitamin D 50 MCG (2000 UT) Caps Take 2,000 Units by mouth daily.        Follow-up: Return in about 6 months (around 07/17/2018).  Claretta Fraise, M.D.

## 2018-01-16 LAB — CMP14+EGFR
ALT: 15 IU/L (ref 0–32)
AST: 19 IU/L (ref 0–40)
Albumin/Globulin Ratio: 1.6 (ref 1.2–2.2)
Albumin: 4.2 g/dL (ref 3.5–5.5)
Alkaline Phosphatase: 90 IU/L (ref 39–117)
BUN/Creatinine Ratio: 21 (ref 9–23)
BUN: 18 mg/dL (ref 6–24)
Bilirubin Total: 0.2 mg/dL (ref 0.0–1.2)
CO2: 20 mmol/L (ref 20–29)
Calcium: 9 mg/dL (ref 8.7–10.2)
Chloride: 108 mmol/L — ABNORMAL HIGH (ref 96–106)
Creatinine, Ser: 0.87 mg/dL (ref 0.57–1.00)
GFR calc Af Amer: 86 mL/min/{1.73_m2} (ref >59)
GFR, EST NON AFRICAN AMERICAN: 74 mL/min/{1.73_m2} (ref >59)
Globulin, Total: 2.6 g/dL (ref 1.5–4.5)
Glucose: 80 mg/dL (ref 65–99)
POTASSIUM: 3.6 mmol/L (ref 3.5–5.2)
Sodium: 139 mmol/L (ref 134–144)
Total Protein: 6.8 g/dL (ref 6.0–8.5)

## 2018-01-16 LAB — CBC WITH DIFFERENTIAL/PLATELET
Basophils Absolute: 0 10*3/uL (ref 0.0–0.2)
Basos: 1 %
EOS (ABSOLUTE): 0.2 10*3/uL (ref 0.0–0.4)
Eos: 3 %
Hematocrit: 37.8 % (ref 34.0–46.6)
Hemoglobin: 12.9 g/dL (ref 11.1–15.9)
IMMATURE GRANS (ABS): 0 10*3/uL (ref 0.0–0.1)
Immature Granulocytes: 0 %
LYMPHS: 32 %
Lymphocytes Absolute: 1.7 10*3/uL (ref 0.7–3.1)
MCH: 31.4 pg (ref 26.6–33.0)
MCHC: 34.1 g/dL (ref 31.5–35.7)
MCV: 92 fL (ref 79–97)
Monocytes Absolute: 0.4 10*3/uL (ref 0.1–0.9)
Monocytes: 8 %
Neutrophils Absolute: 3 10*3/uL (ref 1.4–7.0)
Neutrophils: 56 %
PLATELETS: 239 10*3/uL (ref 150–450)
RBC: 4.11 x10E6/uL (ref 3.77–5.28)
RDW: 13.1 % (ref 12.3–15.4)
WBC: 5.3 10*3/uL (ref 3.4–10.8)

## 2018-01-16 LAB — VITAMIN D 25 HYDROXY (VIT D DEFICIENCY, FRACTURES): Vit D, 25-Hydroxy: 36.5 ng/mL (ref 30.0–100.0)

## 2018-01-16 LAB — TSH: TSH: 1.71 u[IU]/mL (ref 0.450–4.500)

## 2018-01-16 LAB — URINE CULTURE: Organism ID, Bacteria: NO GROWTH

## 2018-01-18 NOTE — Progress Notes (Signed)
Hello Amy Gray,  Your lab result is normal.Some minor variations that are not significant are commonly marked abnormal, but do not represent any medical problem for you.  Best regards, Elvis Boot, M.D.

## 2018-01-23 ENCOUNTER — Encounter: Payer: Self-pay | Admitting: Family Medicine

## 2018-04-01 ENCOUNTER — Encounter: Payer: Self-pay | Admitting: Family Medicine

## 2018-04-01 ENCOUNTER — Other Ambulatory Visit: Payer: Self-pay | Admitting: Family Medicine

## 2018-04-02 ENCOUNTER — Other Ambulatory Visit: Payer: Self-pay | Admitting: Family Medicine

## 2018-04-02 MED ORDER — MONTELUKAST SODIUM 10 MG PO TABS: 10.0000 mg | ORAL_TABLET | Freq: Every day | ORAL | 3 refills | Status: DC

## 2018-05-31 ENCOUNTER — Other Ambulatory Visit: Payer: Self-pay | Admitting: Family Medicine

## 2018-06-08 ENCOUNTER — Encounter: Payer: Self-pay | Admitting: Family Medicine

## 2018-06-24 ENCOUNTER — Other Ambulatory Visit: Payer: Self-pay | Admitting: Family Medicine

## 2018-06-30 ENCOUNTER — Other Ambulatory Visit: Payer: Self-pay | Admitting: Family Medicine

## 2018-07-19 ENCOUNTER — Other Ambulatory Visit: Payer: Self-pay

## 2018-07-20 ENCOUNTER — Telehealth: Payer: Self-pay | Admitting: Family Medicine

## 2018-07-20 ENCOUNTER — Ambulatory Visit (INDEPENDENT_AMBULATORY_CARE_PROVIDER_SITE_OTHER): Payer: 59 | Admitting: Family Medicine

## 2018-07-20 ENCOUNTER — Encounter: Payer: Self-pay | Admitting: Family Medicine

## 2018-07-20 VITALS — BP 103/65 | HR 69 | Temp 96.9°F | Ht 62.0 in | Wt 150.0 lb

## 2018-07-20 DIAGNOSIS — E559 Vitamin D deficiency, unspecified: Secondary | ICD-10-CM

## 2018-07-20 DIAGNOSIS — F99 Mental disorder, not otherwise specified: Secondary | ICD-10-CM

## 2018-07-20 DIAGNOSIS — F5105 Insomnia due to other mental disorder: Secondary | ICD-10-CM

## 2018-07-20 DIAGNOSIS — E782 Mixed hyperlipidemia: Secondary | ICD-10-CM

## 2018-07-20 DIAGNOSIS — G2581 Restless legs syndrome: Secondary | ICD-10-CM | POA: Diagnosis not present

## 2018-07-20 LAB — LIPID PANEL

## 2018-07-20 MED ORDER — MONTELUKAST SODIUM 10 MG PO TABS: 10.0000 mg | ORAL_TABLET | Freq: Every day | ORAL | 5 refills | Status: DC

## 2018-07-20 MED ORDER — OMEPRAZOLE 40 MG PO CPDR: 40.0000 mg | DELAYED_RELEASE_CAPSULE | Freq: Every day | ORAL | 1 refills | Status: DC

## 2018-07-20 MED ORDER — ESCITALOPRAM OXALATE 10 MG PO TABS: 10.0000 mg | ORAL_TABLET | Freq: Every day | ORAL | 1 refills | Status: DC

## 2018-07-20 MED ORDER — ROSUVASTATIN CALCIUM 10 MG PO TABS: 10.0000 mg | ORAL_TABLET | Freq: Every day | ORAL | 5 refills | Status: DC

## 2018-07-20 MED ORDER — ESTRADIOL 0.5 MG PO TABS: 0.5000 mg | ORAL_TABLET | Freq: Every day | ORAL | 5 refills | Status: DC

## 2018-07-20 MED ORDER — TRAZODONE HCL 150 MG PO TABS: ORAL_TABLET | ORAL | 1 refills | Status: DC

## 2018-07-20 MED ORDER — ROPINIROLE HCL 1 MG PO TABS: 1.0000 mg | ORAL_TABLET | Freq: Every day | ORAL | 5 refills | Status: DC

## 2018-07-20 MED ORDER — BUDESONIDE-FORMOTEROL FUMARATE 160-4.5 MCG/ACT IN AERO: INHALATION_SPRAY | RESPIRATORY_TRACT | 0 refills | Status: DC

## 2018-07-20 NOTE — Progress Notes (Signed)
Subjective:  Patient ID: Amy Gray, female    DOB: [birth year]  Age: 58 y.o. MRN: [id number]  CC: Medical Management of Chronic Issues   HPI Zahira Brummond presents for concern for memory loss.  She had some problems remembering some minor tasks recently so she had her headache Dr. test her mental status.  She came out with signs of Alzheimer's.  Mental status from observation did not go along with this.  Therefore he test her further and determined that she had a great deal of anxiety.  Medical history is significant for significant anxiety which seems to now be flaring.  She would like to have treatment for this.  She wants to avoid Xanax.  Breathing seems to be doing well.  She continues to use inhalers this ordered.  No other concerns regarding asthma today.  Patient in for follow-up of elevated cholesterol. Doing well without complaints on current medication. Denies side effects of statin including myalgia and arthralgia and nausea. Also in today for liver function testing. Currently no chest pain, shortness of breath or other cardiovascular related symptoms noted.  Patient is not sleeping very well.  She is only using about 100 mg of the trazodone.  She does not know why she has not increased it.  She just wanted to discuss it today.  She says she does not have any side effects including morning grogginess after use.    Depression screen [facility] 2/9 07/20/2018 01/15/2018 07/15/2017  Decreased Interest 0 0 0  Down, Depressed, Hopeless 0 0 0  PHQ - 2 Score 0 0 0  Altered sleeping - - -  Tired, decreased energy - - -  Change in appetite - - -  Feeling bad or failure about yourself  - - -  Trouble concentrating - - -  Moving slowly or fidgety/restless - - -  Suicidal thoughts - - -  PHQ-9 Score - - -    History Kierah has a past medical history of Anxiety, Asthma, Depression, GERD (gastroesophageal reflux disease), Hyperlipidemia, Migraines, and Osteoarthritis.   She has a past  surgical history that includes Cervical fusion (2015); Rhinoplasty; and Abdominal hysterectomy.   Her family history includes Arthritis in her father; Asthma in her mother; Heart disease in her father and mother; Hypertension in her sister.She reports that she has never smoked. She has never used smokeless tobacco. She reports that she does not drink alcohol or use drugs.    ROS Review of Systems  Constitutional: Negative.   HENT: Negative for congestion.   Eyes: Negative for visual disturbance.  Respiratory: Negative for shortness of breath.   Cardiovascular: Negative for chest pain.  Gastrointestinal: Negative for abdominal pain, constipation, diarrhea, nausea and vomiting.  Genitourinary: Negative for difficulty urinating.  Musculoskeletal: Negative for arthralgias and myalgias.  Neurological: Negative for headaches.  Psychiatric/Behavioral: Negative for sleep disturbance. The patient is nervous/anxious.     Objective:  BP 103/65   Pulse 69   Temp (!) 96.9 F (36.1 C) (Oral)   Ht _0  (1.575 m)   Wt 150 lb (68 kg)   BMI 27.44 kg/m   BP Readings from Last 3 Encounters:  07/20/18 103/65  01/15/18 104/70  10/16/17 (!) 92/59    Wt Readings from Last 3 Encounters:  07/20/18 150 lb (68 kg)  01/15/18 145 lb (65.8 kg)  10/16/17 144 lb (65.3 kg)     Physical Exam Constitutional:      General: She is not in acute distress.  Appearance: She is well-developed.  HENT:     Head: Normocephalic and atraumatic.     Right Ear: External ear normal.     Left Ear: External ear normal.     Nose: Nose normal.  Eyes:     Conjunctiva/sclera: Conjunctivae normal.     Pupils: Pupils are equal, round, and reactive to light.  Neck:     Musculoskeletal: Normal range of motion and neck supple.     Thyroid: No thyromegaly.  Cardiovascular:     Rate and Rhythm: Normal rate and regular rhythm.     Heart sounds: Normal heart sounds. No murmur.  Pulmonary:     Effort: Pulmonary effort  is normal. No respiratory distress.     Breath sounds: Normal breath sounds. No wheezing or rales.  Abdominal:     General: Bowel sounds are normal. There is no distension.     Palpations: Abdomen is soft.     Tenderness: There is no abdominal tenderness.  Lymphadenopathy:     Cervical: No cervical adenopathy.  Skin:    General: Skin is warm and dry.  Neurological:     Mental Status: She is alert and oriented to person, place, and time.     Deep Tendon Reflexes: Reflexes are normal and symmetric.  Psychiatric:        Behavior: Behavior normal.        Thought Content: Thought content normal.        Judgment: Judgment normal.       Assessment & Plan:   Anvitha was seen today for medical management of chronic issues.  Diagnoses and all orders for this visit:  Mixed hyperlipidemia -     CBC with Differential/Platelet -     CMP14+EGFR -     Lipid panel  Vitamin D deficiency -     VITAMIN D 25 Hydroxy (Vit-D Deficiency, Fractures)  Restless leg syndrome, uncontrolled  Insomnia due to other mental disorder  Other orders -     traZODone (DESYREL) 150 MG tablet; TAKE 1/3 (ONE-THIRD) to 1 (ONE) tablet BY MOUTH nightly as needed forsleep. -     budesonide-formoterol (SYMBICORT) 160-4.5 MCG/ACT inhaler; Inhale 2 puffs into the lungs 2 (two) times daily. -     estradiol (ESTRACE) 0.5 MG tablet; Take 1 tablet (0.5 mg total) by mouth daily. -     rOPINIRole (REQUIP) 1 MG tablet; Take 1 tablet (1 mg total) by mouth at bedtime. -     rosuvastatin (CRESTOR) 10 MG tablet; Take 1 tablet (10 mg total) by mouth daily. -     montelukast (SINGULAIR) 10 MG tablet; Take 1 tablet (10 mg total) by mouth at bedtime. -     omeprazole (PRILOSEC) 40 MG capsule; Take 1 capsule (40 mg total) by mouth daily. -     escitalopram (LEXAPRO) 10 MG tablet; Take 1 tablet (10 mg total) by mouth daily.     I encouraged the patient to go ahead and use the full dose of the trazodone.  Also monitor whether her  restless leg syndrome was the source of her awakening during the night and not feeling rested.  Additionally I believe treating her anxiety will also give her relief of the insomnia.  I have discontinued Zykira Chimento's diclofenac sodium, Coenzyme Q10, and Propylene Glycol. I have changed her Symbicort to budesonide-formoterol. I have also changed her estradiol, rOPINIRole, and montelukast. Additionally, I am having her start on escitalopram. Lastly, I am having her maintain her Vitamin D, aspirin, EPINEPHrine,  acetaZOLAMIDE, Linzess, Align, Botox, Peppermint Oil, Restasis, Galcanezumab-gnlm, promethazine, traMADol, butalbital-acetaminophen-caffeine, naproxen, prochlorperazine, albuterol, cetirizine, Ubrogepant (UBRELVY PO), Lasmiditan Succinate (REYVOW PO), traZODone, rosuvastatin, and omeprazole.  Allergies as of 07/20/2018      Reactions   Pregabalin Shortness Of Breath   Chlorzoxazone Other (See Comments)   Decrease in BP    Diclofenac Sodium    Other reaction(s): Other Update   Metoclopramide Other (See Comments)   Restless legs   Sibutramine    Other reaction(s): Other update   Topiramate Other (See Comments)   Venlafaxine Other (See Comments)   Restless legs   Amitriptyline    Duloxetine Hcl    Guaifenesin Er    Oxybutynin    Gabapentin    Other reaction(s): Confusion   Propranolol Anxiety      Medication List       Accurate as of July 20, 2018  6:37 PM. If you have any questions, ask your nurse or doctor.        STOP taking these medications   Coenzyme Q10 200 MG capsule Stopped by: Claretta Fraise, MD   diclofenac sodium 1 % Gel Commonly known as: VOLTAREN Stopped by: Claretta Fraise, MD   Systane Complete 0.6 % Soln Generic drug: Propylene Glycol Stopped by: Claretta Fraise, MD     TAKE these medications   acetaZOLAMIDE 250 MG tablet Commonly known as: DIAMOX Take 250 mg by mouth 2 (two) times daily.   albuterol 108 (90 Base) MCG/ACT inhaler Commonly known  as: Ventolin HFA INHALE 2 PUFFS INTO THE LUNGS FOUR TIMES A DAY AS NEEDED FOR BREATHING   Align 4 MG Caps   aspirin 325 MG EC tablet Take 1 tablet by mouth daily.   Botox 200 units Solr Generic drug: Botulinum Toxin Type A   budesonide-formoterol 160-4.5 MCG/ACT inhaler Commonly known as: Symbicort Inhale 2 puffs into the lungs 2 (two) times daily.   butalbital-acetaminophen-caffeine 50-325-40 MG tablet Commonly known as: FIORICET   cetirizine 10 MG tablet Commonly known as: ZYRTEC Take 1 tablet (10 mg total) by mouth daily. (Needs to be seen before next refill 6 mos)   Emgality 120 MG/ML Soaj Generic drug: Galcanezumab-gnlm Inject into the skin.   EPINEPHrine 0.3 mg/0.3 mL Soaj injection Commonly known as: EPI-PEN Inject 0.3 mg into the muscle.   escitalopram 10 MG tablet Commonly known as: LEXAPRO Take 1 tablet (10 mg total) by mouth daily. Started by: Claretta Fraise, MD   estradiol 0.5 MG tablet Commonly known as: ESTRACE Take 1 tablet (0.5 mg total) by mouth daily. What changed: additional instructions Changed by: Claretta Fraise, MD   IBgard 90 MG Cpcr Generic drug: Peppermint Oil   Linzess 145 MCG Caps capsule Generic drug: linaclotide   montelukast 10 MG tablet Commonly known as: SINGULAIR Take 1 tablet (10 mg total) by mouth at bedtime. What changed: additional instructions Changed by: Claretta Fraise, MD   naproxen 500 MG tablet Commonly known as: NAPROSYN Take by mouth.   omeprazole 40 MG capsule Commonly known as: PRILOSEC Take 1 capsule (40 mg total) by mouth daily.   prochlorperazine 10 MG tablet Commonly known as: COMPAZINE Take 10 mg by mouth every 6 (six) hours as needed for nausea or vomiting.   promethazine 12.5 MG tablet Commonly known as: PHENERGAN Take 12.5 mg by mouth every 6 (six) hours as needed for nausea or vomiting.   Restasis 0.05 % ophthalmic emulsion Generic drug: cycloSPORINE   REYVOW PO Take by mouth.   rOPINIRole 1  MG  tablet Commonly known as: REQUIP Take 1 tablet (1 mg total) by mouth at bedtime. What changed: additional instructions Changed by: Claretta Fraise, MD   rosuvastatin 10 MG tablet Commonly known as: CRESTOR Take 1 tablet (10 mg total) by mouth daily.   traMADol 50 MG tablet Commonly known as: ULTRAM Take 1 tablet (50 mg total) by mouth every 6 (six) hours as needed.   traZODone 150 MG tablet Commonly known as: DESYREL TAKE 1/3 (ONE-THIRD) to 1 (ONE) tablet BY MOUTH nightly as needed forsleep.   UBRELVY PO Take by mouth.   Vitamin D 50 MCG (2000 UT) Caps Take 2,000 Units by mouth daily.        Follow-up: Return in about 6 months (around 01/19/2019), or if symptoms worsen or fail to improve.  Claretta Fraise, M.D.

## 2018-07-20 NOTE — Telephone Encounter (Signed)
I sent in the requested prescription 

## 2018-07-21 LAB — CMP14+EGFR
ALT: 13 IU/L (ref 0–32)
AST: 19 IU/L (ref 0–40)
Albumin/Globulin Ratio: 2.2 (ref 1.2–2.2)
Albumin: 4.4 g/dL (ref 3.8–4.9)
Alkaline Phosphatase: 81 IU/L (ref 39–117)
BUN/Creatinine Ratio: 16 (ref 9–23)
BUN: 16 mg/dL (ref 6–24)
Bilirubin Total: 0.3 mg/dL (ref 0.0–1.2)
CO2: 21 mmol/L (ref 20–29)
Calcium: 9 mg/dL (ref 8.7–10.2)
Chloride: 108 mmol/L — ABNORMAL HIGH (ref 96–106)
Creatinine, Ser: 1.03 mg/dL — ABNORMAL HIGH (ref 0.57–1.00)
GFR calc Af Amer: 69 mL/min/{1.73_m2} (ref >59)
GFR calc non Af Amer: 60 mL/min/{1.73_m2} (ref >59)
Globulin, Total: 2 g/dL (ref 1.5–4.5)
Glucose: 66 mg/dL (ref 65–99)
Potassium: 4 mmol/L (ref 3.5–5.2)
Sodium: 140 mmol/L (ref 134–144)
Total Protein: 6.4 g/dL (ref 6.0–8.5)

## 2018-07-21 LAB — LIPID PANEL
Chol/HDL Ratio: 2.6 ratio (ref 0.0–4.4)
Cholesterol, Total: 179 mg/dL (ref 100–199)
HDL: 70 mg/dL (ref >39)
LDL Calculated: 94 mg/dL (ref 0–99)
Triglycerides: 76 mg/dL (ref 0–149)
VLDL Cholesterol Cal: 15 mg/dL (ref 5–40)

## 2018-07-21 LAB — CBC WITH DIFFERENTIAL/PLATELET
Basophils Absolute: 0 10*3/uL (ref 0.0–0.2)
Basos: 1 %
EOS (ABSOLUTE): 0.1 10*3/uL (ref 0.0–0.4)
Eos: 2 %
Hematocrit: 37.9 % (ref 34.0–46.6)
Hemoglobin: 13 g/dL (ref 11.1–15.9)
Immature Grans (Abs): 0 10*3/uL (ref 0.0–0.1)
Immature Granulocytes: 0 %
Lymphocytes Absolute: 2.1 10*3/uL (ref 0.7–3.1)
Lymphs: 43 %
MCH: 32.3 pg (ref 26.6–33.0)
MCHC: 34.3 g/dL (ref 31.5–35.7)
MCV: 94 fL (ref 79–97)
Monocytes Absolute: 0.7 10*3/uL (ref 0.1–0.9)
Monocytes: 14 %
Neutrophils Absolute: 1.9 10*3/uL (ref 1.4–7.0)
Neutrophils: 40 %
Platelets: 210 10*3/uL (ref 150–450)
RBC: 4.03 x10E6/uL (ref 3.77–5.28)
RDW: 12.5 % (ref 11.7–15.4)
WBC: 4.8 10*3/uL (ref 3.4–10.8)

## 2018-07-21 LAB — VITAMIN D 25 HYDROXY (VIT D DEFICIENCY, FRACTURES): Vit D, 25-Hydroxy: 42.9 ng/mL (ref 30.0–100.0)

## 2018-07-21 NOTE — Progress Notes (Signed)
Hello Shanigua,  Your lab result is normal.Some minor variations that are not significant are commonly marked abnormal, but do not represent any medical problem for you.  Best regards, Claretta Fraise, M.D.

## 2018-07-26 ENCOUNTER — Other Ambulatory Visit: Payer: Self-pay | Admitting: Family Medicine

## 2018-07-27 ENCOUNTER — Other Ambulatory Visit: Payer: Self-pay | Admitting: Family Medicine

## 2018-07-27 ENCOUNTER — Telehealth: Payer: Self-pay | Admitting: Family Medicine

## 2018-07-27 ENCOUNTER — Encounter: Payer: Self-pay | Admitting: Family Medicine

## 2018-07-27 NOTE — Telephone Encounter (Signed)
Stop the lexapro for three days. Then go to 1/2 daily.

## 2018-07-27 NOTE — Telephone Encounter (Signed)
Patient aware and verbalizes understanding. 

## 2018-08-03 ENCOUNTER — Encounter: Payer: Self-pay | Admitting: Family Medicine

## 2018-08-03 ENCOUNTER — Other Ambulatory Visit: Payer: Self-pay | Admitting: Family Medicine

## 2018-08-03 MED ORDER — SERTRALINE HCL 100 MG PO TABS: 100.0000 mg | ORAL_TABLET | Freq: Every day | ORAL | 5 refills | Status: DC

## 2018-08-20 ENCOUNTER — Other Ambulatory Visit: Payer: Self-pay | Admitting: Family Medicine

## 2018-08-24 ENCOUNTER — Other Ambulatory Visit: Payer: Self-pay | Admitting: Family Medicine

## 2018-09-01 ENCOUNTER — Encounter: Payer: Self-pay | Admitting: Family Medicine

## 2018-09-01 ENCOUNTER — Ambulatory Visit (INDEPENDENT_AMBULATORY_CARE_PROVIDER_SITE_OTHER): Payer: 59 | Admitting: Family Medicine

## 2018-09-01 DIAGNOSIS — F411 Generalized anxiety disorder: Secondary | ICD-10-CM | POA: Diagnosis not present

## 2018-09-01 NOTE — Progress Notes (Signed)
    Subjective:    Patient ID: Amy Gray, female    DOB: Jul 28, 1960, 58 y.o.   MRN: [id number]   HPI: Amy Gray is a 58 y.o. female presenting for recheck of anxiety and memory loss. Doing well. Denies any adverse effects. Memory is much better. Anxiety much lessened, eased.   Depression screen [facility] 2/9 07/20/2018 01/15/2018 07/15/2017 01/09/2017 11/24/2016  Decreased Interest 0 0 0 0 0  Down, Depressed, Hopeless 0 0 0 0 0  PHQ - 2 Score 0 0 0 0 0  Altered sleeping - - - - -  Tired, decreased energy - - - - -  Change in appetite - - - - -  Feeling bad or failure about yourself  - - - - -  Trouble concentrating - - - - -  Moving slowly or fidgety/restless - - - - -  Suicidal thoughts - - - - -  PHQ-9 Score - - - - -     Relevant past medical, surgical, family and social history reviewed and updated as indicated.  Interim medical history since our last visit reviewed. Allergies and medications reviewed and updated.  ROS:  Review of Systems  Constitutional: Negative.   HENT: Negative.   Eyes: Negative for visual disturbance.  Respiratory: Negative for shortness of breath.   Cardiovascular: Negative for chest pain.  Gastrointestinal: Negative for abdominal pain.  Musculoskeletal: Negative for arthralgias.    noncontributory Social History   Tobacco Use  Smoking Status Never Smoker  Smokeless Tobacco Never Used       Objective:     Wt Readings from Last 3 Encounters:  07/20/18 150 lb (68 kg)  01/15/18 145 lb (65.8 kg)  10/16/17 144 lb (65.3 kg)     Exam deferred. Pt. Harboring due to COVID 19. Phone visit performed.   Assessment & Plan:   1. Generalized anxiety disorder     No orders of the defined types were placed in this encounter.   No orders of the defined types were placed in this encounter.     Diagnoses and all orders for this visit:  Generalized anxiety disorder  - now in remission. Continue meds as is  Virtual Visit via  telephone Note  I discussed the limitations, risks, security and privacy concerns of performing an evaluation and management service by telephone and the availability of in person appointments. The patient was identified with two identifiers. Pt.expressed understanding and agreed to proceed. Pt. Is at home. Dr. Livia Snellen is in his office.  Follow Up Instructions:   I discussed the assessment and treatment plan with the patient. The patient was provided an opportunity to ask questions and all were answered. The patient agreed with the plan and demonstrated an understanding of the instructions.   The patient was advised to call back or seek an in-person evaluation if the symptoms worsen or if the condition fails to improve as anticipated.   Total minutes including chart review and phone contact time: 14   Follow up plan: Return in about 6 months (around 03/04/2019), or if symptoms worsen or fail to improve, for Wellness.  Claretta Fraise, MD [place]

## 2018-09-14 ENCOUNTER — Other Ambulatory Visit: Payer: Self-pay | Admitting: Family Medicine

## 2018-09-20 ENCOUNTER — Other Ambulatory Visit: Payer: Self-pay | Admitting: Family Medicine

## 2018-10-04 ENCOUNTER — Other Ambulatory Visit: Payer: Self-pay | Admitting: Family Medicine

## 2018-10-07 ENCOUNTER — Ambulatory Visit (INDEPENDENT_AMBULATORY_CARE_PROVIDER_SITE_OTHER): Payer: 59 | Admitting: Family Medicine

## 2018-10-07 DIAGNOSIS — J45901 Unspecified asthma with (acute) exacerbation: Secondary | ICD-10-CM

## 2018-10-07 MED ORDER — PREDNISONE 10 MG PO TABS: ORAL_TABLET | ORAL | 0 refills | Status: DC

## 2018-10-09 ENCOUNTER — Encounter: Payer: Self-pay | Admitting: Family Medicine

## 2018-10-09 NOTE — Progress Notes (Signed)
Subjective:    Patient ID: Amy Gray, female    DOB: 08-Jun-1960, 58 y.o.   MRN: [id number]   HPI: Amy Gray is a 58 y.o. female presenting for flare of asthma. Wheezing. Dyspnea on mild to moderate exertion. Inadequate relief with inhaler. Minimal cough. No fever, chills or sweats. Increasing daily for about a week.   Depression screen [facility] 2/9 07/20/2018 01/15/2018 07/15/2017 01/09/2017 11/24/2016  Decreased Interest 0 0 0 0 0  Down, Depressed, Hopeless 0 0 0 0 0  PHQ - 2 Score 0 0 0 0 0  Altered sleeping - - - - -  Tired, decreased energy - - - - -  Change in appetite - - - - -  Feeling bad or failure about yourself  - - - - -  Trouble concentrating - - - - -  Moving slowly or fidgety/restless - - - - -  Suicidal thoughts - - - - -  PHQ-9 Score - - - - -     Relevant past medical, surgical, family and social history reviewed and updated as indicated.  Interim medical history since our last visit reviewed. Allergies and medications reviewed and updated.  ROS:  Review of Systems  Constitutional: Negative.   HENT: Positive for postnasal drip, rhinorrhea and sneezing.   Eyes: Negative for visual disturbance.  Respiratory: Positive for cough, shortness of breath and wheezing.   Cardiovascular: Negative for chest pain.  Gastrointestinal: Negative for abdominal pain.  Musculoskeletal: Negative for arthralgias.     Social History   Tobacco Use  Smoking Status Never Smoker  Smokeless Tobacco Never Used       Objective:     Wt Readings from Last 3 Encounters:  07/20/18 150 lb (68 kg)  01/15/18 145 lb (65.8 kg)  10/16/17 144 lb (65.3 kg)     Exam deferred. Pt. Harboring due to COVID 19. Phone visit performed.   Assessment & Plan:   1. Moderate asthma with exacerbation, unspecified whether persistent     Meds ordered this encounter  Medications  . predniSONE (DELTASONE) 10 MG tablet    Sig: Take 5 daily for 2 days followed by 4,3,2 and 1 for 2  days each.    Dispense:  30 tablet    Refill:  0    No orders of the defined types were placed in this encounter.     Diagnoses and all orders for this visit:  Moderate asthma with exacerbation, unspecified whether persistent  Other orders -     predniSONE (DELTASONE) 10 MG tablet; Take 5 daily for 2 days followed by 4,3,2 and 1 for 2 days each.    Virtual Visit via telephone Note  I discussed the limitations, risks, security and privacy concerns of performing an evaluation and management service by telephone and the availability of in person appointments. The patient was identified with two identifiers. Pt.expressed understanding and agreed to proceed. Pt. Is at home. Dr. Livia Snellen is in his office.  Follow Up Instructions:   I discussed the assessment and treatment plan with the patient. The patient was provided an opportunity to ask questions and all were answered. The patient agreed with the plan and demonstrated an understanding of the instructions.   The patient was advised to call back or seek an in-person evaluation if the symptoms worsen or if the condition fails to improve as anticipated.   Total minutes including chart review and phone contact time: 16   Follow up plan: Return if symptoms  worsen or fail to improve.  Claretta Fraise, MD [place]

## 2018-11-01 ENCOUNTER — Ambulatory Visit (INDEPENDENT_AMBULATORY_CARE_PROVIDER_SITE_OTHER): Payer: 59 | Admitting: Family Medicine

## 2018-11-01 ENCOUNTER — Other Ambulatory Visit: Payer: Self-pay

## 2018-11-01 DIAGNOSIS — R233 Spontaneous ecchymoses: Secondary | ICD-10-CM

## 2018-11-02 ENCOUNTER — Encounter: Payer: Self-pay | Admitting: Family Medicine

## 2018-11-02 NOTE — Progress Notes (Signed)
    Subjective:    Patient ID: Amy Gray, female    DOB: Jan 08, 1961, 58 y.o.   MRN: [id number]   HPI: Amy Gray is a 58 y.o. female presenting for multiple bruises noted over the lower extremities for the last few weeks.  However she denies any bleeding from the gums, nose, urine, stool/anus.  There is not excessive tenderness.  She spoke to her sister in [place] who is a doctor.  The sister suggested that she have a platelet count performed.   Depression screen [facility] 2/9 07/20/2018 01/15/2018 07/15/2017 01/09/2017 11/24/2016  Decreased Interest 0 0 0 0 0  Down, Depressed, Hopeless 0 0 0 0 0  PHQ - 2 Score 0 0 0 0 0  Altered sleeping - - - - -  Tired, decreased energy - - - - -  Change in appetite - - - - -  Feeling bad or failure about yourself  - - - - -  Trouble concentrating - - - - -  Moving slowly or fidgety/restless - - - - -  Suicidal thoughts - - - - -  PHQ-9 Score - - - - -     Relevant past medical, surgical, family and social history reviewed and updated as indicated.  Interim medical history since our last visit reviewed. Allergies and medications reviewed and updated.  ROS:  Review of Systems  Constitutional: Negative.   HENT: Negative.   Eyes: Negative for visual disturbance.  Respiratory: Negative for shortness of breath.   Cardiovascular: Negative for chest pain.  Gastrointestinal: Negative for abdominal pain.  Musculoskeletal: Negative for arthralgias.     Social History   Tobacco Use  Smoking Status Never Smoker  Smokeless Tobacco Never Used       Objective:     Wt Readings from Last 3 Encounters:  07/20/18 150 lb (68 kg)  01/15/18 145 lb (65.8 kg)  10/16/17 144 lb (65.3 kg)     Exam deferred. Pt. Harboring due to COVID 19. Phone visit performed.   Assessment & Plan:   1. Spontaneous bruising     I advised patient that should she have any bleeding from this and or free bleeding from any cuts or scrapes that we should check  blood work and consider work-up for erythema nodosum.  No orders of the defined types were placed in this encounter.     Diagnoses and all orders for this visit:  Spontaneous bruising    Virtual Visit via telephone Note  I discussed the limitations, risks, security and privacy concerns of performing an evaluation and management service by telephone and the availability of in person appointments. The patient was identified with two identifiers. Pt.expressed understanding and agreed to proceed. Pt. Is at home. Dr. Livia Snellen is in his office.  Follow Up Instructions:   I discussed the assessment and treatment plan with the patient. The patient was provided an opportunity to ask questions and all were answered. The patient agreed with the plan and demonstrated an understanding of the instructions.   The patient was advised to call back or seek an in-person evaluation if the symptoms worsen or if the condition fails to improve as anticipated.   Total minutes including chart review and phone contact time: 21   Follow up plan: Return if symptoms worsen or fail to improve.  Claretta Fraise, MD [place]

## 2018-11-15 ENCOUNTER — Other Ambulatory Visit: Payer: Self-pay | Admitting: Family Medicine

## 2018-11-29 ENCOUNTER — Other Ambulatory Visit: Payer: Self-pay | Admitting: Family Medicine

## 2018-11-30 ENCOUNTER — Telehealth: Payer: Self-pay | Admitting: Family Medicine

## 2018-11-30 NOTE — Telephone Encounter (Signed)
appt made

## 2018-12-01 ENCOUNTER — Other Ambulatory Visit: Payer: Self-pay

## 2018-12-01 ENCOUNTER — Ambulatory Visit (INDEPENDENT_AMBULATORY_CARE_PROVIDER_SITE_OTHER): Payer: 59 | Admitting: Physician Assistant

## 2018-12-01 ENCOUNTER — Encounter: Payer: Self-pay | Admitting: Physician Assistant

## 2018-12-01 DIAGNOSIS — R202 Paresthesia of skin: Secondary | ICD-10-CM | POA: Diagnosis not present

## 2018-12-02 ENCOUNTER — Encounter: Payer: Self-pay | Admitting: Physician Assistant

## 2018-12-02 LAB — CBC WITH DIFFERENTIAL/PLATELET
Basophils Absolute: 0.1 10*3/uL (ref 0.0–0.2)
Basos: 1 %
EOS (ABSOLUTE): 0.2 10*3/uL (ref 0.0–0.4)
Eos: 4 %
Hematocrit: 40.1 % (ref 34.0–46.6)
Hemoglobin: 13.3 g/dL (ref 11.1–15.9)
Immature Grans (Abs): 0 10*3/uL (ref 0.0–0.1)
Immature Granulocytes: 0 %
Lymphocytes Absolute: 1.8 10*3/uL (ref 0.7–3.1)
Lymphs: 34 %
MCH: 30.9 pg (ref 26.6–33.0)
MCHC: 33.2 g/dL (ref 31.5–35.7)
MCV: 93 fL (ref 79–97)
Monocytes Absolute: 0.5 10*3/uL (ref 0.1–0.9)
Monocytes: 10 %
Neutrophils Absolute: 2.7 10*3/uL (ref 1.4–7.0)
Neutrophils: 51 %
Platelets: 222 10*3/uL (ref 150–450)
RBC: 4.3 x10E6/uL (ref 3.77–5.28)
RDW: 12.7 % (ref 11.7–15.4)
WBC: 5.3 10*3/uL (ref 3.4–10.8)

## 2018-12-02 LAB — VITAMIN B12: Vitamin B-12: 346 pg/mL (ref 232–1245)

## 2018-12-05 NOTE — Progress Notes (Signed)
Telephone visit  Subjective: CC :  Legs and arms tingling PCP: Mechele Claude, MD TDD:[id number] Amy Gray is a 58 y.o. female calls for telephone consult today. Patient provides verbal consent for consult held via phone.  Patient is identified with 2 separate identifiers.  At this time the entire area is on COVID-19 social distancing and stay home orders are in place.  Patient is of higher risk and therefore we are performing this by a virtual method.  Location of patient: home Location of provider: HOME Others present for call: no  This patient is having a visit related to some restless leg and hand and face tingling.  She states it has been going on over the past week or so.  It is even been it across her buttocks.  She is not known to have vitamin B-12 deficiency.  She does have restless leg syndrome.  Her medications are reviewed.  There've been no other changes.  No fever or chills.   ROS: Per HPI  Allergies  Allergen Reactions  . Pregabalin Shortness Of Breath  . Chlorzoxazone Other (See Comments)    Decrease in BP   . Diclofenac Sodium     Other reaction(s): Other Update  . Metoclopramide Other (See Comments)    Restless legs  . Sibutramine     Other reaction(s): Other update  . Topiramate Other (See Comments)  . Venlafaxine Other (See Comments)    Restless legs  . Amitriptyline   . Duloxetine Hcl   . Guaifenesin Er   . Oxybutynin   . Gabapentin     Other reaction(s): Confusion  . Propranolol Anxiety   Past Medical History:  Diagnosis Date  . Anxiety   . Asthma   . Depression   . GERD (gastroesophageal reflux disease)   . Hyperlipidemia   . Migraines   . Osteoarthritis     Current Outpatient Medications:  .  acetaZOLAMIDE (DIAMOX) 250 MG tablet, Take 250 mg by mouth 2 (two) times daily. , Disp: , Rfl:  .  albuterol (VENTOLIN HFA) 108 (90 Base) MCG/ACT inhaler, INHALE 2 PUFFS INTO THE LUNGS FOUR TIMES A DAY AS NEEDED FOR BREATHING, Disp: 18 g, Rfl: 0 .   aspirin 325 MG EC tablet, Take 1 tablet by mouth daily., Disp: , Rfl:  .  BOTOX 200 units SOLR, , Disp: , Rfl:  .  budesonide-formoterol (SYMBICORT) 160-4.5 MCG/ACT inhaler, Inhale 2 puffs into the lungs 2 (two) times daily., Disp: 10.2 g, Rfl: 2 .  butalbital-acetaminophen-caffeine (FIORICET, ESGIC) 50-325-40 MG tablet, , Disp: , Rfl:  .  cetirizine (ZYRTEC) 10 MG tablet, Take 1 tablet (10 mg total) by mouth daily., Disp: 30 tablet, Rfl: 5 .  Cholecalciferol (VITAMIN D) 2000 UNITS CAPS, Take 2,000 Units by mouth daily., Disp: , Rfl:  .  EPINEPHrine 0.3 mg/0.3 mL IJ SOAJ injection, Inject 0.3 mg into the muscle., Disp: , Rfl:  .  estradiol (ESTRACE) 0.5 MG tablet, Take 1 tablet (0.5 mg total) by mouth daily., Disp: 30 tablet, Rfl: 5 .  Galcanezumab-gnlm (EMGALITY) 120 MG/ML SOAJ, Inject into the skin., Disp: , Rfl:  .  Lasmiditan Succinate (REYVOW PO), Take by mouth., Disp: , Rfl:  .  LINZESS 145 MCG CAPS capsule, , Disp: , Rfl:  .  montelukast (SINGULAIR) 10 MG tablet, Take 1 tablet (10 mg total) by mouth at bedtime., Disp: 30 tablet, Rfl: 5 .  omeprazole (PRILOSEC) 40 MG capsule, Take 1 capsule (40 mg total) by mouth daily., Disp: 90 capsule,  Rfl: 1 .  Peppermint Oil (IBGARD) 90 MG CPCR, , Disp: , Rfl:  .  predniSONE (DELTASONE) 10 MG tablet, Take 5 daily for 2 days followed by 4,3,2 and 1 for 2 days each., Disp: 30 tablet, Rfl: 0 .  Probiotic Product (ALIGN) 4 MG CAPS, , Disp: , Rfl:  .  prochlorperazine (COMPAZINE) 10 MG tablet, Take 10 mg by mouth every 6 (six) hours as needed for nausea or vomiting., Disp: , Rfl:  .  promethazine (PHENERGAN) 12.5 MG tablet, Take 12.5 mg by mouth every 6 (six) hours as needed for nausea or vomiting., Disp: , Rfl:  .  RESTASIS 0.05 % ophthalmic emulsion, , Disp: , Rfl:  .  rOPINIRole (REQUIP) 1 MG tablet, Take 1 tablet (1 mg total) by mouth at bedtime., Disp: 30 tablet, Rfl: 5 .  rosuvastatin (CRESTOR) 10 MG tablet, Take 1 tablet (10 mg total) by mouth  daily., Disp: 30 tablet, Rfl: 5 .  sertraline (ZOLOFT) 100 MG tablet, Take 1 tablet (100 mg total) by mouth daily. For the first week take only 1/2 tablet daily. Then increase to a full dose., Disp: 30 tablet, Rfl: 5 .  traMADol (ULTRAM) 50 MG tablet, Take 1 tablet (50 mg total) by mouth every 6 (six) hours as needed., Disp: 50 tablet, Rfl: 5 .  traZODone (DESYREL) 150 MG tablet, TAKE 1/3 (ONE-THIRD) to 1 (ONE) tablet BY MOUTH nightly as needed forsleep., Disp: 30 tablet, Rfl: 5 .  Ubrogepant (UBRELVY PO), Take by mouth., Disp: , Rfl:   Assessment/ Plan: 58 y.o. female   1. Tingling of face - CBC with Differential/Platelet; Future - Vitamin B12; Future - Vitamin B12 - CBC with Differential/Platelet  2. Tingling in extremities - CBC with Differential/Platelet; Future - Vitamin B12; Future - Vitamin B12 - CBC with Differential/Platelet   No follow-ups on file.  Continue all other maintenance medications as listed above.  Start time: 9:30 AM End time: 9:41 AM  No orders of the defined types were placed in this encounter.   Particia Nearing PA-C [place] [phone]

## 2018-12-06 ENCOUNTER — Telehealth: Payer: Self-pay | Admitting: Family Medicine

## 2018-12-06 ENCOUNTER — Other Ambulatory Visit: Payer: Self-pay | Admitting: *Deleted

## 2018-12-06 DIAGNOSIS — E538 Deficiency of other specified B group vitamins: Secondary | ICD-10-CM

## 2018-12-06 NOTE — Telephone Encounter (Signed)
Please contact the patient to set up B 12 injections.

## 2018-12-06 NOTE — Telephone Encounter (Signed)
Pt reviewed labs on mychart and called in about them.  Pt would like to go ahead and start B12 injections.  Can we order for her to do here in office - per pt request ??   ( would like a flu shot at first b12 injection as well)

## 2018-12-06 NOTE — Telephone Encounter (Signed)
This is a patient of Dr. Livia Snellen.  Afforded him the labs and a note about my telephone visit with her.  I think this would be appropriate but it can be passed on to him for the long-term therapy.

## 2018-12-07 ENCOUNTER — Other Ambulatory Visit: Payer: Self-pay

## 2018-12-07 ENCOUNTER — Other Ambulatory Visit: Payer: Self-pay | Admitting: *Deleted

## 2018-12-07 ENCOUNTER — Ambulatory Visit (INDEPENDENT_AMBULATORY_CARE_PROVIDER_SITE_OTHER): Payer: 59

## 2018-12-07 DIAGNOSIS — E538 Deficiency of other specified B group vitamins: Secondary | ICD-10-CM | POA: Diagnosis not present

## 2018-12-07 DIAGNOSIS — Z23 Encounter for immunization: Secondary | ICD-10-CM

## 2018-12-07 MED ORDER — CYANOCOBALAMIN 1000 MCG/ML IJ SOLN
1000.0000 ug | INTRAMUSCULAR | Status: DC
  Administered 2018-12-07 – 2019-01-04 (×3): 1000 ug via INTRAMUSCULAR

## 2018-12-07 NOTE — Progress Notes (Signed)
Cyanocobalamin injection given to right deltoid.  Patient tolerated well. 

## 2018-12-07 NOTE — Telephone Encounter (Signed)
Aware.  Vitamin b-12 injections ordered to begin today.

## 2018-12-13 ENCOUNTER — Other Ambulatory Visit: Payer: Self-pay

## 2018-12-13 ENCOUNTER — Ambulatory Visit: Payer: 59

## 2018-12-14 ENCOUNTER — Other Ambulatory Visit: Payer: Self-pay | Admitting: Family Medicine

## 2018-12-14 ENCOUNTER — Ambulatory Visit: Payer: 59

## 2018-12-21 ENCOUNTER — Other Ambulatory Visit: Payer: Self-pay

## 2018-12-22 ENCOUNTER — Ambulatory Visit: Payer: 59 | Admitting: Family Medicine

## 2018-12-23 ENCOUNTER — Other Ambulatory Visit: Payer: Self-pay

## 2018-12-23 ENCOUNTER — Ambulatory Visit (INDEPENDENT_AMBULATORY_CARE_PROVIDER_SITE_OTHER): Payer: 59

## 2018-12-23 DIAGNOSIS — E538 Deficiency of other specified B group vitamins: Secondary | ICD-10-CM | POA: Diagnosis not present

## 2018-12-23 MED ORDER — CYANOCOBALAMIN 1000 MCG/ML IJ SOLN
1000.0000 ug | Freq: Once | INTRAMUSCULAR | Status: AC
  Administered 2018-12-23: 1000 ug via INTRAMUSCULAR

## 2018-12-23 NOTE — Progress Notes (Signed)
Cyanocobalamin injection given to left deltoid.  Patient tolerated well. 

## 2018-12-28 ENCOUNTER — Other Ambulatory Visit: Payer: Self-pay | Admitting: Family Medicine

## 2018-12-28 ENCOUNTER — Other Ambulatory Visit: Payer: Self-pay

## 2018-12-29 ENCOUNTER — Other Ambulatory Visit: Payer: Self-pay | Admitting: Family Medicine

## 2018-12-29 ENCOUNTER — Ambulatory Visit (INDEPENDENT_AMBULATORY_CARE_PROVIDER_SITE_OTHER): Payer: 59 | Admitting: Family Medicine

## 2018-12-29 ENCOUNTER — Telehealth: Payer: Self-pay | Admitting: Family Medicine

## 2018-12-29 DIAGNOSIS — E538 Deficiency of other specified B group vitamins: Secondary | ICD-10-CM | POA: Diagnosis not present

## 2018-12-29 DIAGNOSIS — F411 Generalized anxiety disorder: Secondary | ICD-10-CM

## 2018-12-29 DIAGNOSIS — R5383 Other fatigue: Secondary | ICD-10-CM | POA: Diagnosis not present

## 2018-12-29 DIAGNOSIS — G43709 Chronic migraine without aura, not intractable, without status migrainosus: Secondary | ICD-10-CM | POA: Diagnosis not present

## 2018-12-29 DIAGNOSIS — M797 Fibromyalgia: Secondary | ICD-10-CM | POA: Diagnosis not present

## 2018-12-29 DIAGNOSIS — K2100 Gastro-esophageal reflux disease with esophagitis, without bleeding: Secondary | ICD-10-CM

## 2018-12-29 MED ORDER — OMEPRAZOLE 40 MG PO CPDR: 40.0000 mg | DELAYED_RELEASE_CAPSULE | Freq: Two times a day (BID) | ORAL | 5 refills | Status: DC

## 2018-12-29 MED ORDER — FEXOFENADINE HCL 180 MG PO TABS: 180.0000 mg | ORAL_TABLET | Freq: Every day | ORAL | 11 refills | Status: DC

## 2018-12-29 NOTE — Telephone Encounter (Signed)
I don't see a mail order pharmacy in her file. I pended the order.PLease check on who she uses. Thanks, WS

## 2018-12-29 NOTE — Telephone Encounter (Signed)
Patient uses [facility].  I have added this to her pharmacy list for you.

## 2018-12-29 NOTE — Telephone Encounter (Signed)
Patient is asking if monthly B12 injections can be ordered and sent to her mail order pharmacy in chart. Please advise.

## 2018-12-30 LAB — CMP14+EGFR
ALT: 16 IU/L (ref 0–32)
AST: 18 IU/L (ref 0–40)
Albumin/Globulin Ratio: 1.8 (ref 1.2–2.2)
Albumin: 4.1 g/dL (ref 3.8–4.9)
Alkaline Phosphatase: 103 IU/L (ref 39–117)
BUN/Creatinine Ratio: 16 (ref 9–23)
BUN: 15 mg/dL (ref 6–24)
Bilirubin Total: <0.2 mg/dL (ref 0.0–1.2)
CO2: 22 mmol/L (ref 20–29)
Calcium: 9.1 mg/dL (ref 8.7–10.2)
Chloride: 109 mmol/L — ABNORMAL HIGH (ref 96–106)
Creatinine, Ser: 0.94 mg/dL (ref 0.57–1.00)
GFR calc Af Amer: 77 mL/min/{1.73_m2} (ref >59)
GFR calc non Af Amer: 67 mL/min/{1.73_m2} (ref >59)
Globulin, Total: 2.3 g/dL (ref 1.5–4.5)
Glucose: 101 mg/dL — ABNORMAL HIGH (ref 65–99)
Potassium: 4.3 mmol/L (ref 3.5–5.2)
Sodium: 144 mmol/L (ref 134–144)
Total Protein: 6.4 g/dL (ref 6.0–8.5)

## 2018-12-30 LAB — TSH: TSH: 2.01 u[IU]/mL (ref 0.450–4.500)

## 2018-12-30 MED ORDER — CYANOCOBALAMIN 1000 MCG/ML IJ KIT: 1000.0000 ug | PACK | INTRAMUSCULAR | 3 refills | Status: DC

## 2018-12-30 NOTE — Progress Notes (Signed)
Hello Amy Gray,  Your lab result is normal and/or stable.Some minor variations that are not significant are commonly marked abnormal, but do not represent any medical problem for you.  Best regards, Clairessa Boulet, M.D.

## 2018-12-30 NOTE — Telephone Encounter (Signed)
I sent in the requested prescription 

## 2018-12-31 ENCOUNTER — Encounter: Payer: Self-pay | Admitting: Family Medicine

## 2018-12-31 MED ORDER — CYANOCOBALAMIN 1000 MCG/ML IJ KIT: 1000.0000 ug | PACK | INTRAMUSCULAR | 3 refills | Status: DC

## 2018-12-31 NOTE — Progress Notes (Signed)
Subjective:  Patient ID: Amy Gray, female    DOB: 27-Jun-1960  Age: 58 y.o. MRN: [id number]  CC: Heartburn and Dizziness   HPI Amy Gray presents for epigastric pain with burnoing sensation happening daily with certain foods. INcreasind if spicy. Increased her own PPI with resolution of sx. Needs scrip increased to BID if possible.   Dizziness is lightheaded, off balance. Intermittent. No focal neuro changes. Accompanied bby increased fatigue.  Depression screen [facility] 2/9 12/29/2018 07/20/2018 01/15/2018  Decreased Interest 0 0 0  Down, Depressed, Hopeless 0 0 0  PHQ - 2 Score 0 0 0  Altered sleeping - - -  Tired, decreased energy - - -  Change in appetite - - -  Feeling bad or failure about yourself  - - -  Trouble concentrating - - -  Moving slowly or fidgety/restless - - -  Suicidal thoughts - - -  PHQ-9 Score - - -    History Amy Gray has a past medical history of Anxiety, Asthma, Depression, GERD (gastroesophageal reflux disease), Hyperlipidemia, Migraines, and Osteoarthritis.   She has a past surgical history that includes Cervical fusion (2015); Rhinoplasty; and Abdominal hysterectomy.   Her family history includes Arthritis in her father; Asthma in her mother; Heart disease in her father and mother; Hypertension in her sister.She reports that she has never smoked. She has never used smokeless tobacco. She reports that she does not drink alcohol or use drugs.    ROS Review of Systems  Constitutional: Negative.   HENT: Negative for congestion.   Eyes: Negative for visual disturbance.  Respiratory: Negative for shortness of breath.   Cardiovascular: Negative for chest pain.  Gastrointestinal: Negative for abdominal pain, constipation, diarrhea, nausea and vomiting.  Genitourinary: Negative for difficulty urinating.  Musculoskeletal: Positive for arthralgias and myalgias.  Neurological: Positive for dizziness, light-headedness and headaches.    Psychiatric/Behavioral: Negative for sleep disturbance. The patient is nervous/anxious.     Objective:  There were no vitals taken for this visit.  BP Readings from Last 3 Encounters:  07/20/18 103/65  01/15/18 104/70  10/16/17 (!) 92/59    Wt Readings from Last 3 Encounters:  07/20/18 150 lb (68 kg)  01/15/18 145 lb (65.8 kg)  10/16/17 144 lb (65.3 kg)     Physical Exam Constitutional:      General: She is not in acute distress.    Appearance: She is well-developed.  HENT:     Head: Normocephalic and atraumatic.     Right Ear: External ear normal.     Left Ear: External ear normal.     Nose: Nose normal.  Eyes:     Conjunctiva/sclera: Conjunctivae normal.     Pupils: Pupils are equal, round, and reactive to light.  Neck:     Thyroid: No thyromegaly.  Cardiovascular:     Rate and Rhythm: Normal rate and regular rhythm.     Heart sounds: Normal heart sounds. No murmur.  Pulmonary:     Effort: Pulmonary effort is normal. No respiratory distress.     Breath sounds: Normal breath sounds. No wheezing or rales.  Abdominal:     General: Bowel sounds are normal. There is no distension.     Palpations: Abdomen is soft.     Tenderness: There is no abdominal tenderness.  Musculoskeletal:     Cervical back: Normal range of motion and neck supple.  Lymphadenopathy:     Cervical: No cervical adenopathy.  Skin:    General: Skin is warm and dry.  Neurological:     Mental Status: She is alert and oriented to person, place, and time.     Deep Tendon Reflexes: Reflexes are normal and symmetric.  Psychiatric:        Attention and Perception: Attention normal.        Mood and Affect: Mood is anxious.        Speech: Speech normal.        Behavior: Behavior normal.        Thought Content: Thought content normal.        Cognition and Memory: Cognition normal.        Judgment: Judgment normal.       Assessment & Plan:   Asuzena was seen today for heartburn and  dizziness.  Diagnoses and all orders for this visit:  Gastroesophageal reflux disease with esophagitis without hemorrhage  Fatigue, unspecified type -     CMP -     TSH  Fibromyalgia syndrome  Chronic migraine without aura without status migrainosus, not intractable  Generalized anxiety disorder  Vitamin B12 deficiency  Other orders -     fexofenadine (ALLEGRA) 180 MG tablet; Take 1 tablet (180 mg total) by mouth daily. For allergy symptoms -     omeprazole (PRILOSEC) 40 MG capsule; Take 1 capsule (40 mg total) by mouth 2 (two) times daily. -     Cyanocobalamin 1000 MCG/ML KIT; Inject 1,000 mcg as directed every 30 (thirty) days.       I have discontinued Emorie Lauricella's EPINEPHrine and predniSONE. I have also changed her omeprazole. Additionally, I am having her start on fexofenadine and Cyanocobalamin. Lastly, I am having her maintain her Vitamin D, aspirin, acetaZOLAMIDE, Linzess, Align, Botox, Peppermint Oil, Restasis, Galcanezumab-gnlm, promethazine, traMADol, butalbital-acetaminophen-caffeine, prochlorperazine, Ubrogepant (UBRELVY PO), Lasmiditan Succinate (REYVOW PO), rOPINIRole, traZODone, budesonide-formoterol, estradiol, rosuvastatin, montelukast, and albuterol. We administered cyanocobalamin. We will continue to administer cyanocobalamin.  Allergies as of 12/29/2018      Reactions   Pregabalin Shortness Of Breath   Chlorzoxazone Other (See Comments)   Decrease in BP    Diclofenac Sodium    Other reaction(s): Other Update   Metoclopramide Other (See Comments)   Restless legs   Sibutramine    Other reaction(s): Other update   Topiramate Other (See Comments)   Venlafaxine Other (See Comments)   Restless legs   Amitriptyline    Duloxetine Hcl    Guaifenesin Er    Oxybutynin    Gabapentin    Other reaction(s): Confusion   Propranolol Anxiety      Medication List       Accurate as of December 29, 2018 11:59 PM. If you have any questions, ask your nurse  or doctor.        STOP taking these medications   cetirizine 10 MG tablet Commonly known as: ZYRTEC Stopped by: Fulp, Caryl Pina, CMA   EPINEPHrine 0.3 mg/0.3 mL Soaj injection Commonly known as: EPI-PEN Stopped by: Claretta Fraise, MD   predniSONE 10 MG tablet Commonly known as: DELTASONE Stopped by: Claretta Fraise, MD     TAKE these medications   acetaZOLAMIDE 250 MG tablet Commonly known as: DIAMOX Take 250 mg by mouth 2 (two) times daily.   albuterol 108 (90 Base) MCG/ACT inhaler Commonly known as: Ventolin HFA INHALE 2 PUFFS INTO THE LUNGS FOUR TIMES A DAY AS NEEDED FOR BREATHING   Align 4 MG Caps   aspirin 325 MG EC tablet Take 1 tablet by mouth daily.   Botox 200 units Solr Generic  drug: Botulinum Toxin Type A   budesonide-formoterol 160-4.5 MCG/ACT inhaler Commonly known as: Symbicort Inhale 2 puffs into the lungs 2 (two) times daily.   butalbital-acetaminophen-caffeine 50-325-40 MG tablet Commonly known as: FIORICET   Cyanocobalamin 1000 MCG/ML Kit Inject 1,000 mcg as directed every 30 (thirty) days. Started by: Waverly Ferrari, CMA   Cyanocobalamin 1000 MCG/ML Kit Inject 1,000 mcg as directed every 30 (thirty) days. Started by: Claretta Fraise, MD   Emgality 120 MG/ML Soaj Generic drug: Galcanezumab-gnlm Inject into the skin.   estradiol 0.5 MG tablet Commonly known as: ESTRACE Take 1 tablet (0.5 mg total) by mouth daily.   fexofenadine 180 MG tablet Commonly known as: ALLEGRA Take 1 tablet (180 mg total) by mouth daily. For allergy symptoms Started by: Claretta Fraise, MD   IBgard 90 MG Cpcr Generic drug: Peppermint Oil   Linzess 145 MCG Caps capsule Generic drug: linaclotide   montelukast 10 MG tablet Commonly known as: SINGULAIR Take 1 tablet (10 mg total) by mouth at bedtime.   omeprazole 40 MG capsule Commonly known as: PRILOSEC Take 1 capsule (40 mg total) by mouth 2 (two) times daily. What changed: when to take this Changed by: Claretta Fraise, MD   prochlorperazine 10 MG tablet Commonly known as: COMPAZINE Take 10 mg by mouth every 6 (six) hours as needed for nausea or vomiting.   promethazine 12.5 MG tablet Commonly known as: PHENERGAN Take 12.5 mg by mouth every 6 (six) hours as needed for nausea or vomiting.   Restasis 0.05 % ophthalmic emulsion Generic drug: cycloSPORINE   REYVOW PO Take by mouth.   rOPINIRole 1 MG tablet Commonly known as: REQUIP Take 1 tablet (1 mg total) by mouth at bedtime.   rosuvastatin 10 MG tablet Commonly known as: CRESTOR Take 1 tablet (10 mg total) by mouth daily.   sertraline 100 MG tablet Commonly known as: ZOLOFT Take 1 tablet by mouth daily. For the first week take only 1/2 tabletdaily, Then increase to a full dose. What changed: See the new instructions. Changed by: Claretta Fraise, MD   traMADol 50 MG tablet Commonly known as: ULTRAM Take 1 tablet (50 mg total) by mouth every 6 (six) hours as needed.   traZODone 150 MG tablet Commonly known as: DESYREL TAKE 1/3 (ONE-THIRD) to 1 (ONE) tablet BY MOUTH nightly as needed forsleep.   UBRELVY PO Take by mouth.   Vitamin D 50 MCG (2000 UT) Caps Take 2,000 Units by mouth daily.        Follow-up: Return in about 3 months (around 03/29/2019).  Claretta Fraise, M.D.  This note is not being shared with the patient for the following reason: To prevent harm (release of this note would result in harm to the life or physical safety of the patient or another).

## 2019-01-04 ENCOUNTER — Ambulatory Visit (INDEPENDENT_AMBULATORY_CARE_PROVIDER_SITE_OTHER): Payer: 59

## 2019-01-04 ENCOUNTER — Other Ambulatory Visit: Payer: Self-pay

## 2019-01-04 DIAGNOSIS — E538 Deficiency of other specified B group vitamins: Secondary | ICD-10-CM

## 2019-01-04 NOTE — Progress Notes (Signed)
Cyanocobalamin injection given to left deltoid.  Patient tolerated well. 

## 2019-01-05 ENCOUNTER — Ambulatory Visit: Payer: 59

## 2019-01-11 ENCOUNTER — Other Ambulatory Visit: Payer: Self-pay | Admitting: Family Medicine

## 2019-02-18 ENCOUNTER — Other Ambulatory Visit: Payer: Self-pay | Admitting: Family Medicine

## 2019-02-28 ENCOUNTER — Other Ambulatory Visit: Payer: Self-pay | Admitting: Family Medicine

## 2019-03-18 ENCOUNTER — Other Ambulatory Visit: Payer: Self-pay | Admitting: Family Medicine

## 2019-03-29 ENCOUNTER — Other Ambulatory Visit: Payer: Self-pay | Admitting: Family Medicine

## 2019-03-30 ENCOUNTER — Telehealth (INDEPENDENT_AMBULATORY_CARE_PROVIDER_SITE_OTHER): Payer: 59 | Admitting: Family Medicine

## 2019-03-30 ENCOUNTER — Other Ambulatory Visit: Payer: Self-pay | Admitting: *Deleted

## 2019-03-30 ENCOUNTER — Encounter: Payer: Self-pay | Admitting: Family Medicine

## 2019-03-30 DIAGNOSIS — G912 (Idiopathic) normal pressure hydrocephalus: Secondary | ICD-10-CM | POA: Diagnosis not present

## 2019-03-30 DIAGNOSIS — F411 Generalized anxiety disorder: Secondary | ICD-10-CM | POA: Diagnosis not present

## 2019-03-30 DIAGNOSIS — M797 Fibromyalgia: Secondary | ICD-10-CM | POA: Diagnosis not present

## 2019-03-30 DIAGNOSIS — E782 Mixed hyperlipidemia: Secondary | ICD-10-CM

## 2019-03-30 DIAGNOSIS — F5105 Insomnia due to other mental disorder: Secondary | ICD-10-CM

## 2019-03-30 DIAGNOSIS — F99 Mental disorder, not otherwise specified: Secondary | ICD-10-CM | POA: Insufficient documentation

## 2019-03-30 DIAGNOSIS — N951 Menopausal and female climacteric states: Secondary | ICD-10-CM

## 2019-03-30 DIAGNOSIS — E559 Vitamin D deficiency, unspecified: Secondary | ICD-10-CM

## 2019-03-30 DIAGNOSIS — J45909 Unspecified asthma, uncomplicated: Secondary | ICD-10-CM | POA: Diagnosis not present

## 2019-03-30 DIAGNOSIS — E538 Deficiency of other specified B group vitamins: Secondary | ICD-10-CM

## 2019-03-30 DIAGNOSIS — J3089 Other allergic rhinitis: Secondary | ICD-10-CM | POA: Insufficient documentation

## 2019-03-30 DIAGNOSIS — G2581 Restless legs syndrome: Secondary | ICD-10-CM

## 2019-03-30 MED ORDER — CYANOCOBALAMIN 1000 MCG/ML IJ KIT: 1000.0000 ug | PACK | INTRAMUSCULAR | 0 refills | Status: DC

## 2019-03-30 MED ORDER — MONTELUKAST SODIUM 10 MG PO TABS: 10.0000 mg | ORAL_TABLET | Freq: Every day | ORAL | 0 refills | Status: DC

## 2019-03-30 MED ORDER — FEXOFENADINE HCL 180 MG PO TABS: 180.0000 mg | ORAL_TABLET | Freq: Every day | ORAL | 0 refills | Status: DC

## 2019-03-30 MED ORDER — ALBUTEROL SULFATE HFA 108 (90 BASE) MCG/ACT IN AERS: INHALATION_SPRAY | RESPIRATORY_TRACT | 0 refills | Status: DC

## 2019-03-30 MED ORDER — SERTRALINE HCL 100 MG PO TABS: ORAL_TABLET | ORAL | 0 refills | Status: DC

## 2019-03-30 MED ORDER — ROSUVASTATIN CALCIUM 10 MG PO TABS: 10.0000 mg | ORAL_TABLET | Freq: Every day | ORAL | 0 refills | Status: DC

## 2019-03-30 MED ORDER — TRAZODONE HCL 150 MG PO TABS: 150.0000 mg | ORAL_TABLET | Freq: Every day | ORAL | 0 refills | Status: DC

## 2019-03-30 MED ORDER — ESTRADIOL 0.5 MG PO TABS: 0.5000 mg | ORAL_TABLET | Freq: Every day | ORAL | 0 refills | Status: DC

## 2019-03-30 MED ORDER — ROPINIROLE HCL 1 MG PO TABS: ORAL_TABLET | ORAL | 0 refills | Status: DC

## 2019-03-30 NOTE — Progress Notes (Signed)
Subjective:    Patient ID: Amy Gray, female    DOB: 12/04/1960, 59 y.o.   MRN: [id number]   HPI: Amy Gray is a 59 y.o. female presenting for being tired a lot. Especially at the first part of the day. Using a wholentrazodone tablet at bedtime routinely. Pain meds handled by pain clinic. Several meds need refills. Her asthma is doing well. Denies dyspnea. Does not need symbicort refillls. Zoloft is due for refills. Seems to be helping adequately for mood. Denies myalgias from Crestor. Continues on estrace with good control of menopausal symptoms. Patient has allergic rhinitis symptoms including sneezing frequently sniffling, clear rhinorrhea, watery and itchy eyes. There has been no fever no chills no sweats. No earaches. There is some scratchy throat but no sore throat or difficulty swallowing. There is some nasal congestion.  She has seen improvement with the combination of Allegra and Singulair. Restless leg treatment is adequately controlled with ropinirole.  Sleep is good with trazodone.  Having morning grogginess though.   Depression screen [facility] 2/9 12/29/2018 07/20/2018 01/15/2018 07/15/2017 01/09/2017  Decreased Interest 0 0 0 0 0  Down, Depressed, Hopeless 0 0 0 0 0  PHQ - 2 Score 0 0 0 0 0  Altered sleeping - - - - -  Tired, decreased energy - - - - -  Change in appetite - - - - -  Feeling bad or failure about yourself  - - - - -  Trouble concentrating - - - - -  Moving slowly or fidgety/restless - - - - -  Suicidal thoughts - - - - -  PHQ-9 Score - - - - -     Relevant past medical, surgical, family and social history reviewed and updated as indicated.  Interim medical history since our last visit reviewed. Allergies and medications reviewed and updated.  ROS:  Review of Systems  Constitutional: Negative for fever.  HENT: Negative for congestion, rhinorrhea and sore throat.   Respiratory: Negative for cough and shortness of breath.   Cardiovascular:  Negative for chest pain and palpitations.  Gastrointestinal: Negative for abdominal pain.  Musculoskeletal: Negative for arthralgias and myalgias.     Social History   Tobacco Use  Smoking Status Never Smoker  Smokeless Tobacco Never Used       Objective:     Wt Readings from Last 3 Encounters:  07/20/18 150 lb (68 kg)  01/15/18 145 lb (65.8 kg)  10/16/17 144 lb (65.3 kg)     Video visit performed. Physical Exam Constitutional:      General: She is not in acute distress.    Appearance: Normal appearance. She is not ill-appearing or toxic-appearing.  HENT:     Head: Normocephalic and atraumatic.     Right Ear: External ear normal.     Left Ear: External ear normal.     Nose: Nose normal.  Eyes:     Extraocular Movements: Extraocular movements intact.  Musculoskeletal:     Cervical back: Normal range of motion.  Neurological:     Mental Status: She is alert and oriented to person, place, and time.     Cranial Nerves: No cranial nerve deficit.  Psychiatric:        Mood and Affect: Mood normal.        Behavior: Behavior normal.        Thought Content: Thought content normal.     Assessment & Plan:   1. Normal pressure hydrocephalus (HCC)   2. Fibromyalgia syndrome  3. Generalized anxiety disorder   4. Moderate asthma without complication, unspecified whether persistent   5. Mixed hyperlipidemia   6. Vitamin B12 deficiency   7. Restless leg syndrome, uncontrolled   8. Insomnia due to other mental disorder   9. Vitamin D deficiency   10. Hot flashes due to menopause   11. Non-seasonal allergic rhinitis, unspecified trigger     Meds ordered this encounter  Medications  . rosuvastatin (CRESTOR) 10 MG tablet    Sig: Take 1 tablet (10 mg total) by mouth daily.    Dispense:  90 tablet    Refill:  0  . sertraline (ZOLOFT) 100 MG tablet    Sig: Take 1 tablet by mouth daily. For the first week take only 1/2 tabletdaily, Then increase to a full dose.     Dispense:  90 tablet    Refill:  0  . traZODone (DESYREL) 150 MG tablet    Sig: Take 1 tablet (150 mg total) by mouth at bedtime.    Dispense:  90 tablet    Refill:  0  . rOPINIRole (REQUIP) 1 MG tablet    Sig: Take 1 tablet (1 mg total) by mouth at bedtime.    Dispense:  90 tablet    Refill:  0  . fexofenadine (ALLEGRA) 180 MG tablet    Sig: Take 1 tablet (180 mg total) by mouth daily. For allergy symptoms    Dispense:  90 tablet    Refill:  0  . estradiol (ESTRACE) 0.5 MG tablet    Sig: Take 1 tablet (0.5 mg total) by mouth daily.    Dispense:  90 tablet    Refill:  0  . Cyanocobalamin 1000 MCG/ML KIT    Sig: Inject 1,000 mcg as directed every 30 (thirty) days.    Dispense:  3 kit    Refill:  0  . albuterol (VENTOLIN HFA) 108 (90 Base) MCG/ACT inhaler    Sig: INHALE 2 PUFFS INTO THE LUNGS FOUR TIMES A DAY AS NEEDED FOR BREATHING    Dispense:  54 g    Refill:  0  . montelukast (SINGULAIR) 10 MG tablet    Sig: Take 1 tablet (10 mg total) by mouth at bedtime.    Dispense:  90 tablet    Refill:  0    No orders of the defined types were placed in this encounter.     Diagnoses and all orders for this visit:  Normal pressure hydrocephalus (HCC)  Fibromyalgia syndrome -     traZODone (DESYREL) 150 MG tablet; Take 1 tablet (150 mg total) by mouth at bedtime. -     rOPINIRole (REQUIP) 1 MG tablet; Take 1 tablet (1 mg total) by mouth at bedtime.  Generalized anxiety disorder -     sertraline (ZOLOFT) 100 MG tablet; Take 1 tablet by mouth daily. For the first week take only 1/2 tabletdaily, Then increase to a full dose.  Moderate asthma without complication, unspecified whether persistent -     fexofenadine (ALLEGRA) 180 MG tablet; Take 1 tablet (180 mg total) by mouth daily. For allergy symptoms -     albuterol (VENTOLIN HFA) 108 (90 Base) MCG/ACT inhaler; INHALE 2 PUFFS INTO THE LUNGS FOUR TIMES A DAY AS NEEDED FOR BREATHING -     montelukast (SINGULAIR) 10 MG tablet; Take 1  tablet (10 mg total) by mouth at bedtime.  Mixed hyperlipidemia -     rosuvastatin (CRESTOR) 10 MG tablet; Take 1 tablet (10 mg total) by  mouth daily.  Vitamin B12 deficiency -     Cyanocobalamin 1000 MCG/ML KIT; Inject 1,000 mcg as directed every 30 (thirty) days.  Restless leg syndrome, uncontrolled -     rOPINIRole (REQUIP) 1 MG tablet; Take 1 tablet (1 mg total) by mouth at bedtime.  Insomnia due to other mental disorder -     traZODone (DESYREL) 150 MG tablet; Take 1 tablet (150 mg total) by mouth at bedtime.  Vitamin D deficiency  Hot flashes due to menopause -     estradiol (ESTRACE) 0.5 MG tablet; Take 1 tablet (0.5 mg total) by mouth daily.  Non-seasonal allergic rhinitis, unspecified trigger    Virtual Visit via telephone Note  I discussed the limitations, risks, security and privacy concerns of performing an evaluation and management service by telephone and the availability of in person appointments. The patient was identified with two identifiers. Pt.expressed understanding and agreed to proceed. Pt. Is at home. Dr. Livia Snellen is in his office.  Follow Up Instructions:   I discussed the assessment and treatment plan with the patient. The patient was provided an opportunity to ask questions and all were answered. The patient agreed with the plan and demonstrated an understanding of the instructions.   The patient was advised to call back or seek an in-person evaluation if the symptoms worsen or if the condition fails to improve as anticipated.   Total minutes including chart review and phone contact time: 45   Follow up plan: Return in about 6 months (around 09/30/2019).  Claretta Fraise, MD [place]

## 2019-03-31 ENCOUNTER — Other Ambulatory Visit: Payer: Self-pay | Admitting: *Deleted

## 2019-03-31 MED ORDER — ALBUTEROL SULFATE HFA 108 (90 BASE) MCG/ACT IN AERS: 2.0000 | INHALATION_SPRAY | Freq: Four times a day (QID) | RESPIRATORY_TRACT | 11 refills | Status: DC | PRN

## 2019-04-06 ENCOUNTER — Other Ambulatory Visit: Payer: Self-pay | Admitting: *Deleted

## 2019-04-06 MED ORDER — BUDESONIDE-FORMOTEROL FUMARATE 160-4.5 MCG/ACT IN AERO: INHALATION_SPRAY | RESPIRATORY_TRACT | 0 refills | Status: DC

## 2019-04-06 MED ORDER — OMEPRAZOLE 40 MG PO CPDR: 40.0000 mg | DELAYED_RELEASE_CAPSULE | Freq: Two times a day (BID) | ORAL | 0 refills | Status: DC

## 2019-04-06 NOTE — Addendum Note (Signed)
Addended by: Julious Payer D on: 04/06/2019 09:43 AM   Modules accepted: Orders

## 2019-04-15 ENCOUNTER — Other Ambulatory Visit: Payer: Self-pay | Admitting: Family Medicine

## 2019-04-15 DIAGNOSIS — N951 Menopausal and female climacteric states: Secondary | ICD-10-CM

## 2019-04-15 DIAGNOSIS — G2581 Restless legs syndrome: Secondary | ICD-10-CM

## 2019-04-15 DIAGNOSIS — M797 Fibromyalgia: Secondary | ICD-10-CM

## 2019-04-15 DIAGNOSIS — E782 Mixed hyperlipidemia: Secondary | ICD-10-CM

## 2019-04-15 DIAGNOSIS — J45909 Unspecified asthma, uncomplicated: Secondary | ICD-10-CM

## 2019-06-21 ENCOUNTER — Encounter: Payer: Self-pay | Admitting: Family Medicine

## 2019-06-21 ENCOUNTER — Other Ambulatory Visit: Payer: Self-pay | Admitting: Family Medicine

## 2019-06-21 MED ORDER — PREDNISONE 10 MG PO TABS: ORAL_TABLET | ORAL | 0 refills | Status: DC

## 2019-09-02 ENCOUNTER — Other Ambulatory Visit: Payer: Self-pay | Admitting: Family Medicine

## 2019-09-02 DIAGNOSIS — E538 Deficiency of other specified B group vitamins: Secondary | ICD-10-CM

## 2019-09-02 MED ORDER — CYANOCOBALAMIN 1000 MCG/ML IJ KIT: 1000.0000 ug | PACK | INTRAMUSCULAR | 0 refills | Status: DC

## 2019-09-02 NOTE — Telephone Encounter (Signed)
°  Prescription Request  09/02/2019  What is the name of the medication or equipment? B12 Shots  Have you contacted your pharmacy to request a refill? (if applicable) Yes  Which pharmacy would you like this sent to[facility]  Pt says she switched insurance and needs her B12 Shots sent to [facility] instead of [facility] [employer] [facility] [employer].   Patient notified that their request is being sent to the clinical staff for review and that they should receive a response within 2 business days.

## 2019-09-07 ENCOUNTER — Other Ambulatory Visit: Payer: Self-pay | Admitting: Family Medicine

## 2019-09-07 DIAGNOSIS — N951 Menopausal and female climacteric states: Secondary | ICD-10-CM

## 2019-09-07 DIAGNOSIS — J45909 Unspecified asthma, uncomplicated: Secondary | ICD-10-CM

## 2019-09-09 ENCOUNTER — Other Ambulatory Visit: Payer: Self-pay | Admitting: Family Medicine

## 2019-09-09 DIAGNOSIS — M797 Fibromyalgia: Secondary | ICD-10-CM

## 2019-09-09 DIAGNOSIS — M25552 Pain in left hip: Secondary | ICD-10-CM | POA: Diagnosis not present

## 2019-09-09 DIAGNOSIS — G2581 Restless legs syndrome: Secondary | ICD-10-CM

## 2019-09-21 DIAGNOSIS — R6889 Other general symptoms and signs: Secondary | ICD-10-CM | POA: Diagnosis not present

## 2019-09-21 DIAGNOSIS — M25552 Pain in left hip: Secondary | ICD-10-CM | POA: Diagnosis not present

## 2019-09-21 DIAGNOSIS — M25551 Pain in right hip: Secondary | ICD-10-CM | POA: Diagnosis not present

## 2019-09-21 DIAGNOSIS — M7061 Trochanteric bursitis, right hip: Secondary | ICD-10-CM | POA: Diagnosis not present

## 2019-09-22 DIAGNOSIS — G43709 Chronic migraine without aura, not intractable, without status migrainosus: Secondary | ICD-10-CM | POA: Diagnosis not present

## 2019-09-27 DIAGNOSIS — F419 Anxiety disorder, unspecified: Secondary | ICD-10-CM | POA: Diagnosis not present

## 2019-09-27 DIAGNOSIS — G4700 Insomnia, unspecified: Secondary | ICD-10-CM | POA: Diagnosis not present

## 2019-09-27 DIAGNOSIS — J45909 Unspecified asthma, uncomplicated: Secondary | ICD-10-CM | POA: Diagnosis not present

## 2019-09-27 DIAGNOSIS — K589 Irritable bowel syndrome without diarrhea: Secondary | ICD-10-CM | POA: Diagnosis not present

## 2019-09-27 DIAGNOSIS — G43909 Migraine, unspecified, not intractable, without status migrainosus: Secondary | ICD-10-CM | POA: Diagnosis not present

## 2019-09-27 DIAGNOSIS — G2581 Restless legs syndrome: Secondary | ICD-10-CM | POA: Diagnosis not present

## 2019-09-27 DIAGNOSIS — K219 Gastro-esophageal reflux disease without esophagitis: Secondary | ICD-10-CM | POA: Diagnosis not present

## 2019-09-27 DIAGNOSIS — G8929 Other chronic pain: Secondary | ICD-10-CM | POA: Diagnosis not present

## 2019-09-27 DIAGNOSIS — R69 Illness, unspecified: Secondary | ICD-10-CM | POA: Diagnosis not present

## 2019-09-27 DIAGNOSIS — M719 Bursopathy, unspecified: Secondary | ICD-10-CM | POA: Diagnosis not present

## 2019-09-28 ENCOUNTER — Other Ambulatory Visit: Payer: Self-pay

## 2019-09-28 ENCOUNTER — Encounter: Payer: Self-pay | Admitting: Family Medicine

## 2019-09-28 ENCOUNTER — Ambulatory Visit (INDEPENDENT_AMBULATORY_CARE_PROVIDER_SITE_OTHER): Payer: Medicare HMO | Admitting: Family Medicine

## 2019-09-28 VITALS — BP 105/73 | HR 76 | Temp 97.6°F | Ht 62.0 in | Wt 169.6 lb

## 2019-09-28 DIAGNOSIS — G912 (Idiopathic) normal pressure hydrocephalus: Secondary | ICD-10-CM

## 2019-09-28 DIAGNOSIS — Z0001 Encounter for general adult medical examination with abnormal findings: Secondary | ICD-10-CM

## 2019-09-28 DIAGNOSIS — E538 Deficiency of other specified B group vitamins: Secondary | ICD-10-CM

## 2019-09-28 DIAGNOSIS — R69 Illness, unspecified: Secondary | ICD-10-CM | POA: Diagnosis not present

## 2019-09-28 DIAGNOSIS — G2581 Restless legs syndrome: Secondary | ICD-10-CM | POA: Diagnosis not present

## 2019-09-28 DIAGNOSIS — J3089 Other allergic rhinitis: Secondary | ICD-10-CM | POA: Diagnosis not present

## 2019-09-28 DIAGNOSIS — G43709 Chronic migraine without aura, not intractable, without status migrainosus: Secondary | ICD-10-CM | POA: Diagnosis not present

## 2019-09-28 DIAGNOSIS — E782 Mixed hyperlipidemia: Secondary | ICD-10-CM | POA: Diagnosis not present

## 2019-09-28 DIAGNOSIS — F411 Generalized anxiety disorder: Secondary | ICD-10-CM

## 2019-09-28 DIAGNOSIS — J45909 Unspecified asthma, uncomplicated: Secondary | ICD-10-CM | POA: Diagnosis not present

## 2019-09-28 DIAGNOSIS — M797 Fibromyalgia: Secondary | ICD-10-CM | POA: Diagnosis not present

## 2019-09-28 DIAGNOSIS — K2100 Gastro-esophageal reflux disease with esophagitis, without bleeding: Secondary | ICD-10-CM | POA: Diagnosis not present

## 2019-09-28 DIAGNOSIS — E559 Vitamin D deficiency, unspecified: Secondary | ICD-10-CM | POA: Diagnosis not present

## 2019-09-28 MED ORDER — ROPINIROLE HCL 2 MG PO TABS: ORAL_TABLET | ORAL | 1 refills | Status: DC

## 2019-09-28 MED ORDER — OMEPRAZOLE 40 MG PO CPDR: 40.0000 mg | DELAYED_RELEASE_CAPSULE | Freq: Two times a day (BID) | ORAL | 3 refills | Status: DC

## 2019-09-28 MED ORDER — PREDNISONE 10 MG PO TABS: ORAL_TABLET | ORAL | 0 refills | Status: DC

## 2019-09-28 MED ORDER — FEXOFENADINE HCL 180 MG PO TABS: 180.0000 mg | ORAL_TABLET | Freq: Every day | ORAL | 3 refills | Status: DC

## 2019-09-28 MED ORDER — ROSUVASTATIN CALCIUM 10 MG PO TABS: 10.0000 mg | ORAL_TABLET | Freq: Every day | ORAL | 3 refills | Status: DC

## 2019-09-28 MED ORDER — MONTELUKAST SODIUM 10 MG PO TABS: 10.0000 mg | ORAL_TABLET | Freq: Every day | ORAL | 3 refills | Status: DC

## 2019-09-28 MED ORDER — BUDESONIDE-FORMOTEROL FUMARATE 160-4.5 MCG/ACT IN AERO: INHALATION_SPRAY | RESPIRATORY_TRACT | 3 refills | Status: DC

## 2019-09-28 NOTE — Progress Notes (Signed)
Subjective:  Patient ID: Amy Gray, female    DOB: 1960-11-06  Age: 59 y.o. MRN: [id number]  CC: Annual Exam   HPI Amy Gray presents for annual physical without gynecologic exam.  She tells me that she feels that her migraines are causing an increase in her restless legs.  She feels like there is a crawling sensation.  This is interfering with sleep.  She is also titrating zonisamide with her neurologist.  She is having a flare of asthma and allergy with dyspnea and wheezing.  She is having to use her rescue inhaler 0-2 times daily.  She also is using tramadol for back pain.  Depression screen [facility] 2/9 09/28/2019 12/29/2018 07/20/2018  Decreased Interest 0 0 0  Down, Depressed, Hopeless 0 0 0  PHQ - 2 Score 0 0 0  Altered sleeping - - -  Tired, decreased energy - - -  Change in appetite - - -  Feeling bad or failure about yourself  - - -  Trouble concentrating - - -  Moving slowly or fidgety/restless - - -  Suicidal thoughts - - -  PHQ-9 Score - - -    History Amy Gray has a past medical history of Anxiety, Asthma, Depression, GERD (gastroesophageal reflux disease), Hyperlipidemia, Migraines, and Osteoarthritis.   She has a past surgical history that includes Cervical fusion (2015); Rhinoplasty; and Abdominal hysterectomy.   Her family history includes Arthritis in her father; Asthma in her mother; Heart disease in her father and mother; Hypertension in her sister.She reports that she has never smoked. She has never used smokeless tobacco. She reports that she does not drink alcohol and does not use drugs.    ROS Review of Systems  Constitutional: Positive for fatigue. Negative for appetite change, chills, diaphoresis, fever and unexpected weight change.  HENT: Negative for congestion, ear pain, hearing loss, postnasal drip, rhinorrhea, sneezing, sore throat and trouble swallowing.   Eyes: Negative for pain.  Respiratory: Positive for shortness of breath and wheezing.  Negative for cough and chest tightness.   Cardiovascular: Negative for chest pain and palpitations.  Gastrointestinal: Negative for abdominal pain, constipation, diarrhea, nausea and vomiting.  Endocrine: Negative for cold intolerance, heat intolerance, polydipsia, polyphagia and polyuria.  Genitourinary: Negative for dysuria, frequency and menstrual problem.  Musculoskeletal: Positive for arthralgias, back pain and myalgias. Negative for joint swelling.  Skin: Negative for rash.  Allergic/Immunologic: Negative for environmental allergies.  Neurological: Positive for headaches. Negative for dizziness, weakness and numbness.  Psychiatric/Behavioral: Negative for agitation and dysphoric mood.    Objective:  BP 105/73   Pulse 76   Temp 97.6 F (36.4 C) (Temporal)   Ht '5\' 2"'  (1.575 m)   Wt 169 lb 9.6 oz (76.9 kg)   BMI 31.02 kg/m   BP Readings from Last 3 Encounters:  09/28/19 105/73  07/20/18 103/65  01/15/18 104/70    Wt Readings from Last 3 Encounters:  09/28/19 169 lb 9.6 oz (76.9 kg)  07/20/18 150 lb (68 kg)  01/15/18 145 lb (65.8 kg)     Physical Exam Constitutional:      General: She is not in acute distress.    Appearance: Normal appearance. She is well-developed.  HENT:     Head: Normocephalic and atraumatic.     Right Ear: External ear normal.     Left Ear: External ear normal.     Nose: Nose normal.  Eyes:     Conjunctiva/sclera: Conjunctivae normal.     Pupils: Pupils are equal,  round, and reactive to light.  Neck:     Thyroid: No thyromegaly.  Cardiovascular:     Rate and Rhythm: Normal rate and regular rhythm.     Heart sounds: Normal heart sounds. No murmur heard.   Pulmonary:     Effort: Pulmonary effort is normal. No respiratory distress.     Breath sounds: Normal breath sounds. No wheezing or rales.  Chest:     Breasts: Breasts are symmetrical.        Right: No inverted nipple, mass or tenderness.        Left: No inverted nipple, mass or  tenderness.  Abdominal:     General: Bowel sounds are normal. There is no distension or abdominal bruit.     Palpations: Abdomen is soft. There is no hepatomegaly, splenomegaly or mass.     Tenderness: There is no abdominal tenderness. Negative signs include Murphy's sign and McBurney's sign.  Musculoskeletal:        General: No tenderness. Normal range of motion.     Cervical back: Normal range of motion and neck supple.  Lymphadenopathy:     Cervical: No cervical adenopathy.  Skin:    General: Skin is warm and dry.     Findings: No rash.  Neurological:     Mental Status: She is alert and oriented to person, place, and time.     Deep Tendon Reflexes: Reflexes are normal and symmetric.  Psychiatric:        Behavior: Behavior normal.        Thought Content: Thought content normal.        Judgment: Judgment normal.       Assessment & Plan:   Amy Gray was seen today for annual exam.  Diagnoses and all orders for this visit:  Non-seasonal allergic rhinitis, unspecified trigger -     CBC with Differential/Platelet -     CMP14+EGFR -     TSH -     budesonide-formoterol (SYMBICORT) 160-4.5 MCG/ACT inhaler; Inhale 2 puffs into the lungs 2 (two) times daily. -     Ambulatory referral to Allergy  Fibromyalgia syndrome -     rOPINIRole (REQUIP) 2 MG tablet; Take 1 tablet (1 mg total) by mouth at bedtime. ( -     CBC with Differential/Platelet -     CMP14+EGFR -     TSH -     Cancel: Urinalysis  Restless leg syndrome, uncontrolled -     rOPINIRole (REQUIP) 2 MG tablet; Take 1 tablet (1 mg total) by mouth at bedtime. ( -     CBC with Differential/Platelet -     CMP14+EGFR -     TSH  Moderate asthma without complication, unspecified whether persistent -     montelukast (SINGULAIR) 10 MG tablet; Take 1 tablet (10 mg total) by mouth at bedtime. -     CBC with Differential/Platelet -     CMP14+EGFR -     TSH -     budesonide-formoterol (SYMBICORT) 160-4.5 MCG/ACT inhaler; Inhale  2 puffs into the lungs 2 (two) times daily. -     fexofenadine (ALLEGRA) 180 MG tablet; Take 1 tablet (180 mg total) by mouth daily. For allergy symptoms -     Ambulatory referral to Allergy  Mixed hyperlipidemia -     rosuvastatin (CRESTOR) 10 MG tablet; Take 1 tablet (10 mg total) by mouth daily. -     CBC with Differential/Platelet -     CMP14+EGFR -     Lipid panel -  TSH  Normal pressure hydrocephalus (HCC) -     CBC with Differential/Platelet -     CMP14+EGFR -     TSH  Generalized anxiety disorder -     CBC with Differential/Platelet -     CMP14+EGFR -     TSH  Chronic migraine without aura without status migrainosus, not intractable -     CBC with Differential/Platelet -     CMP14+EGFR -     TSH  Gastroesophageal reflux disease with esophagitis without hemorrhage -     omeprazole (PRILOSEC) 40 MG capsule; Take 1 capsule (40 mg total) by mouth 2 (two) times daily. -     CBC with Differential/Platelet -     CMP14+EGFR -     TSH  Vitamin B12 deficiency -     Vitamin B12  Vitamin D deficiency -     VITAMIN D 25 Hydroxy (Vit-D Deficiency, Fractures)  Other orders -     predniSONE (DELTASONE) 10 MG tablet; Take 5 daily for 3 days followed by 4,3,2 and 1 for 3 days each.       I have discontinued Andra Proby's Restasis, Lasmiditan Succinate (REYVOW PO), and predniSONE. I have also changed her rOPINIRole. Additionally, I am having her start on predniSONE. Lastly, I am having her maintain her Vitamin D, aspirin, acetaZOLAMIDE, Align, Botox, Peppermint Oil, Galcanezumab-gnlm, traMADol, butalbital-acetaminophen-caffeine, Ubrogepant (UBRELVY PO), sertraline, traZODone, albuterol, Cyanocobalamin, estradiol, baclofen, celecoxib, montelukast, omeprazole, rosuvastatin, budesonide-formoterol, and fexofenadine. We will continue to administer cyanocobalamin.  Allergies as of 09/28/2019      Reactions   Pregabalin Shortness Of Breath   Chlorzoxazone Other (See Comments)     Decrease in BP    Diclofenac Other (See Comments)   Other reaction(s): Other Other reaction(s): Other Update Update Update Update Other reaction(s): Other Other reaction(s): Other Update Update Update Update   Diclofenac Sodium    Other reaction(s): Other Update   Metoclopramide Other (See Comments)   Restless legs   Sibutramine    Other reaction(s): Other update   Topiramate Other (See Comments)   Venlafaxine Other (See Comments)   Restless legs   Amitriptyline    Duloxetine Hcl    Guaifenesin Er    Oxybutynin    Gabapentin    Other reaction(s): Confusion   Propranolol Anxiety      Medication List       Accurate as of September 28, 2019 11:59 PM. If you have any questions, ask your nurse or doctor.        STOP taking these medications   Restasis 0.05 % ophthalmic emulsion Generic drug: cycloSPORINE Stopped by: Claretta Fraise, MD   REYVOW PO Stopped by: Claretta Fraise, MD     TAKE these medications   acetaZOLAMIDE 250 MG tablet Commonly known as: DIAMOX Take 250 mg by mouth 2 (two) times daily.   albuterol 108 (90 Base) MCG/ACT inhaler Commonly known as: VENTOLIN HFA Inhale 2 puffs into the lungs every 6 (six) hours as needed for wheezing or shortness of breath.   Align 4 MG Caps   aspirin 325 MG EC tablet Take 1 tablet by mouth daily.   baclofen 10 MG tablet Commonly known as: LIORESAL   Botox 200 units Solr Generic drug: Botulinum Toxin Type A   budesonide-formoterol 160-4.5 MCG/ACT inhaler Commonly known as: Symbicort Inhale 2 puffs into the lungs 2 (two) times daily.   butalbital-acetaminophen-caffeine 50-325-40 MG tablet Commonly known as: FIORICET   celecoxib 200 MG capsule Commonly known as: CELEBREX Take by  mouth.   Cyanocobalamin 1000 MCG/ML Kit Inject 1,000 mcg as directed every 30 (thirty) days.   Emgality 120 MG/ML Soaj Generic drug: Galcanezumab-gnlm Inject into the skin.   estradiol 0.5 MG tablet Commonly known as:  ESTRACE Take 1 tablet (0.5 mg total) by mouth daily.   fexofenadine 180 MG tablet Commonly known as: ALLEGRA Take 1 tablet (180 mg total) by mouth daily. For allergy symptoms   IBgard 90 MG Cpcr Generic drug: Peppermint Oil   montelukast 10 MG tablet Commonly known as: SINGULAIR Take 1 tablet (10 mg total) by mouth at bedtime.   omeprazole 40 MG capsule Commonly known as: PRILOSEC Take 1 capsule (40 mg total) by mouth 2 (two) times daily.   predniSONE 10 MG tablet Commonly known as: DELTASONE Take 5 daily for 3 days followed by 4,3,2 and 1 for 3 days each. What changed: additional instructions Changed by: Claretta Fraise, MD   rOPINIRole 2 MG tablet Commonly known as: REQUIP Take 1 tablet (1 mg total) by mouth at bedtime. ( What changed: medication strength Changed by: Claretta Fraise, MD   rosuvastatin 10 MG tablet Commonly known as: CRESTOR Take 1 tablet (10 mg total) by mouth daily.   sertraline 100 MG tablet Commonly known as: ZOLOFT Take 1 tablet by mouth daily. For the first week take only 1/2 tabletdaily, Then increase to a full dose.   traMADol 50 MG tablet Commonly known as: ULTRAM Take 1 tablet (50 mg total) by mouth every 6 (six) hours as needed.   traZODone 150 MG tablet Commonly known as: DESYREL Take 1 tablet (150 mg total) by mouth at bedtime.   UBRELVY PO Take by mouth.   Vitamin D 50 MCG (2000 UT) Caps Take 5,000 Units by mouth daily.        Follow-up: Return in about 3 months (around 12/28/2019).  Claretta Fraise, M.D.

## 2019-09-29 LAB — LIPID PANEL
Chol/HDL Ratio: 2.8 ratio (ref 0.0–4.4)
Cholesterol, Total: 187 mg/dL (ref 100–199)
HDL: 68 mg/dL (ref >39)
LDL Chol Calc (NIH): 97 mg/dL (ref 0–99)
Triglycerides: 125 mg/dL (ref 0–149)
VLDL Cholesterol Cal: 22 mg/dL (ref 5–40)

## 2019-09-29 LAB — CMP14+EGFR
ALT: 16 IU/L (ref 0–32)
AST: 16 IU/L (ref 0–40)
Albumin/Globulin Ratio: 1.9 (ref 1.2–2.2)
Albumin: 4.2 g/dL (ref 3.8–4.9)
Alkaline Phosphatase: 126 IU/L — ABNORMAL HIGH (ref 48–121)
BUN/Creatinine Ratio: 19 (ref 9–23)
BUN: 15 mg/dL (ref 6–24)
Bilirubin Total: 0.2 mg/dL (ref 0.0–1.2)
CO2: 21 mmol/L (ref 20–29)
Calcium: 9.1 mg/dL (ref 8.7–10.2)
Chloride: 107 mmol/L — ABNORMAL HIGH (ref 96–106)
Creatinine, Ser: 0.79 mg/dL (ref 0.57–1.00)
GFR calc Af Amer: 95 mL/min/{1.73_m2} (ref >59)
GFR calc non Af Amer: 82 mL/min/{1.73_m2} (ref >59)
Globulin, Total: 2.2 g/dL (ref 1.5–4.5)
Glucose: 75 mg/dL (ref 65–99)
Potassium: 3.4 mmol/L — ABNORMAL LOW (ref 3.5–5.2)
Sodium: 142 mmol/L (ref 134–144)
Total Protein: 6.4 g/dL (ref 6.0–8.5)

## 2019-09-29 LAB — TSH: TSH: 3.72 u[IU]/mL (ref 0.450–4.500)

## 2019-09-29 LAB — CBC WITH DIFFERENTIAL/PLATELET
Basophils Absolute: 0 10*3/uL (ref 0.0–0.2)
Basos: 1 %
EOS (ABSOLUTE): 0.4 10*3/uL (ref 0.0–0.4)
Eos: 6 %
Hematocrit: 40.9 % (ref 34.0–46.6)
Hemoglobin: 13.2 g/dL (ref 11.1–15.9)
Immature Grans (Abs): 0 10*3/uL (ref 0.0–0.1)
Immature Granulocytes: 0 %
Lymphocytes Absolute: 1.7 10*3/uL (ref 0.7–3.1)
Lymphs: 26 %
MCH: 29.5 pg (ref 26.6–33.0)
MCHC: 32.3 g/dL (ref 31.5–35.7)
MCV: 91 fL (ref 79–97)
Monocytes Absolute: 0.8 10*3/uL (ref 0.1–0.9)
Monocytes: 12 %
Neutrophils Absolute: 3.5 10*3/uL (ref 1.4–7.0)
Neutrophils: 55 %
Platelets: 250 10*3/uL (ref 150–450)
RBC: 4.48 x10E6/uL (ref 3.77–5.28)
RDW: 13.1 % (ref 11.7–15.4)
WBC: 6.3 10*3/uL (ref 3.4–10.8)

## 2019-09-29 LAB — VITAMIN B12: Vitamin B-12: 795 pg/mL (ref 232–1245)

## 2019-09-29 LAB — VITAMIN D 25 HYDROXY (VIT D DEFICIENCY, FRACTURES): Vit D, 25-Hydroxy: 40.3 ng/mL (ref 30.0–100.0)

## 2019-10-04 ENCOUNTER — Encounter: Payer: Self-pay | Admitting: Family Medicine

## 2019-10-08 ENCOUNTER — Other Ambulatory Visit: Payer: Self-pay | Admitting: Family Medicine

## 2019-10-11 ENCOUNTER — Other Ambulatory Visit: Payer: Self-pay | Admitting: Family Medicine

## 2019-10-11 ENCOUNTER — Telehealth: Payer: Self-pay | Admitting: *Deleted

## 2019-10-11 MED ORDER — TRELEGY ELLIPTA 100-62.5-25 MCG/INH IN AEPB: 1.0000 | INHALATION_SPRAY | Freq: Every day | RESPIRATORY_TRACT | 11 refills | Status: DC

## 2019-10-11 NOTE — Telephone Encounter (Signed)
I sent in Trelegy to local pharmacy. Remind her the directions are different. One puff daily.

## 2019-10-11 NOTE — Telephone Encounter (Signed)
Denial for med Budesonide-formoterol fumoterol fumarate aerosol   Alternatives suggestions:  advair diskus Advair HFA Breo Ellipta Symbicort Trelegy   Please choose one of the following

## 2019-10-12 NOTE — Telephone Encounter (Signed)
Pt states she was able to just pick up the brand Name Symbicort as it was cheaper than the generic.

## 2019-10-18 DIAGNOSIS — J8283 Eosinophilic asthma: Secondary | ICD-10-CM | POA: Diagnosis not present

## 2019-10-18 DIAGNOSIS — J310 Chronic rhinitis: Secondary | ICD-10-CM | POA: Diagnosis not present

## 2019-10-18 DIAGNOSIS — R0600 Dyspnea, unspecified: Secondary | ICD-10-CM | POA: Diagnosis not present

## 2019-10-18 DIAGNOSIS — J4550 Severe persistent asthma, uncomplicated: Secondary | ICD-10-CM | POA: Diagnosis not present

## 2019-10-18 DIAGNOSIS — K219 Gastro-esophageal reflux disease without esophagitis: Secondary | ICD-10-CM | POA: Diagnosis not present

## 2019-10-19 ENCOUNTER — Other Ambulatory Visit: Payer: Self-pay

## 2019-10-19 ENCOUNTER — Other Ambulatory Visit: Payer: Medicare HMO

## 2019-10-19 DIAGNOSIS — E876 Hypokalemia: Secondary | ICD-10-CM | POA: Diagnosis not present

## 2019-10-20 ENCOUNTER — Encounter: Payer: Self-pay | Admitting: Family Medicine

## 2019-10-20 LAB — BMP8+EGFR
BUN/Creatinine Ratio: 15 (ref 9–23)
BUN: 16 mg/dL (ref 6–24)
CO2: 20 mmol/L (ref 20–29)
Calcium: 8.5 mg/dL — ABNORMAL LOW (ref 8.7–10.2)
Chloride: 109 mmol/L — ABNORMAL HIGH (ref 96–106)
Creatinine, Ser: 1.06 mg/dL — ABNORMAL HIGH (ref 0.57–1.00)
GFR calc Af Amer: 66 mL/min/{1.73_m2} (ref >59)
GFR calc non Af Amer: 58 mL/min/{1.73_m2} — ABNORMAL LOW (ref >59)
Glucose: 91 mg/dL (ref 65–99)
Potassium: 3.9 mmol/L (ref 3.5–5.2)
Sodium: 144 mmol/L (ref 134–144)

## 2019-10-21 DIAGNOSIS — M25552 Pain in left hip: Secondary | ICD-10-CM | POA: Diagnosis not present

## 2019-10-24 ENCOUNTER — Telehealth: Payer: Self-pay

## 2019-10-24 NOTE — Progress Notes (Signed)
Hello Elivia,  Your lab result is normal and/or stable.Some minor variations that are not significant are commonly marked abnormal, but do not represent any medical problem for you.  Best regards, Rashema Seawright, M.D.

## 2019-10-24 NOTE — Telephone Encounter (Signed)
Bilateral calves cramping for 2 weeks and having trouble to sleeping due restless legs and cramping calves, no redness, no heat or swelling.  Patient states requid was increased at last visit and the only way she can get legs to calm down is to get in a warm bath with jets on.  Appt made

## 2019-10-25 ENCOUNTER — Encounter: Payer: Self-pay | Admitting: Family Medicine

## 2019-10-25 ENCOUNTER — Ambulatory Visit (INDEPENDENT_AMBULATORY_CARE_PROVIDER_SITE_OTHER): Payer: Medicare HMO | Admitting: Family Medicine

## 2019-10-25 ENCOUNTER — Other Ambulatory Visit: Payer: Self-pay

## 2019-10-25 VITALS — BP 117/77 | HR 89 | Temp 96.9°F | Resp 20 | Ht 62.0 in | Wt 167.0 lb

## 2019-10-25 DIAGNOSIS — G2581 Restless legs syndrome: Secondary | ICD-10-CM | POA: Diagnosis not present

## 2019-10-25 DIAGNOSIS — M797 Fibromyalgia: Secondary | ICD-10-CM

## 2019-10-25 DIAGNOSIS — R252 Cramp and spasm: Secondary | ICD-10-CM | POA: Diagnosis not present

## 2019-10-25 MED ORDER — ROPINIROLE HCL 4 MG PO TABS: 4.0000 mg | ORAL_TABLET | Freq: Every day | ORAL | 1 refills | Status: DC

## 2019-10-25 NOTE — Progress Notes (Signed)
Subjective:  Patient ID: Amy Gray, female    DOB: [birth year]  Age: 59 y.o. MRN: [id number]  CC: No chief complaint on file.   HPI Amy Gray presents for increasing cramps in her calves.  She is has a long-term problem with restless leg syndrome.  It seems to get really bad around 6 or 7 PM.  She can only lay down in bed after taking an extended whirlpool bath.  During the day the pain is lessened but is always present even during the day.  We recently increased her ropinirole without success to 2 mg.  She is using some herbal remedies as well.  This is primarily magnesium based.  They do seem to calm it down slightly.  Depression screen [facility] 2/9 10/25/2019 09/28/2019 12/29/2018  Decreased Interest 0 0 0  Down, Depressed, Hopeless 0 0 0  PHQ - 2 Score 0 0 0  Altered sleeping - - -  Tired, decreased energy - - -  Change in appetite - - -  Feeling bad or failure about yourself  - - -  Trouble concentrating - - -  Moving slowly or fidgety/restless - - -  Suicidal thoughts - - -  PHQ-9 Score - - -    History Amy Gray has a past medical history of Anxiety, Asthma, Depression, GERD (gastroesophageal reflux disease), Hyperlipidemia, Migraines, and Osteoarthritis.   She has a past surgical history that includes Cervical fusion (2015); Rhinoplasty; and Abdominal hysterectomy.   Her family history includes Arthritis in her father; Asthma in her mother; Heart disease in her father and mother; Hypertension in her sister.She reports that she has never smoked. She has never used smokeless tobacco. She reports that she does not drink alcohol and does not use drugs.    ROS Review of Systems  Constitutional: Negative.   HENT: Negative.   Eyes: Negative for visual disturbance.  Respiratory: Negative for shortness of breath.   Cardiovascular: Negative for chest pain.  Musculoskeletal: Positive for arthralgias and myalgias.    Objective:  BP 117/77   Pulse 89   Temp (!) 96.9 F  (36.1 C) (Temporal)   Resp 20   Ht '5\' 2"'  (1.575 m)   Wt 167 lb (75.8 kg)   SpO2 97%   BMI 30.54 kg/m   BP Readings from Last 3 Encounters:  10/25/19 117/77  09/28/19 105/73  07/20/18 103/65    Wt Readings from Last 3 Encounters:  10/25/19 167 lb (75.8 kg)  09/28/19 169 lb 9.6 oz (76.9 kg)  07/20/18 150 lb (68 kg)     Physical Exam Constitutional:      General: She is not in acute distress.    Appearance: She is well-developed.  HENT:     Head: Normocephalic and atraumatic.  Eyes:     Conjunctiva/sclera: Conjunctivae normal.     Pupils: Pupils are equal, round, and reactive to light.  Neck:     Thyroid: No thyromegaly.  Cardiovascular:     Rate and Rhythm: Normal rate and regular rhythm.     Heart sounds: Normal heart sounds. No murmur heard.   Pulmonary:     Effort: Pulmonary effort is normal. No respiratory distress.     Breath sounds: Normal breath sounds. No wheezing or rales.  Abdominal:     General: Bowel sounds are normal. There is no distension.     Palpations: Abdomen is soft.     Tenderness: There is no abdominal tenderness.  Musculoskeletal:  General: Normal range of motion.     Cervical back: Normal range of motion and neck supple.  Lymphadenopathy:     Cervical: No cervical adenopathy.  Skin:    General: Skin is warm and dry.  Neurological:     Mental Status: She is alert and oriented to person, place, and time.  Psychiatric:        Behavior: Behavior normal.        Thought Content: Thought content normal.        Judgment: Judgment normal.       Assessment & Plan:   Diagnoses and all orders for this visit:  Restless leg syndrome, uncontrolled -     rOPINIRole (REQUIP) 4 MG tablet; Take 1 tablet (4 mg total) by mouth at bedtime. Take 1 tablet (1 mg total) by mouth at bedtime. ( -     BMP8+EGFR  Leg cramps -     US Venous Img Lower Bilateral (DVT); Future -     BMP8+EGFR  Fibromyalgia syndrome -     rOPINIRole (REQUIP) 4 MG  tablet; Take 1 tablet (4 mg total) by mouth at bedtime. Take 1 tablet (1 mg total) by mouth at bedtime. ( -     BMP8+EGFR       I have discontinued Amy Gray's predniSONE. I have also changed her rOPINIRole. Additionally, I am having her maintain her Vitamin D, aspirin, acetaZOLAMIDE, Align, Botox, Peppermint Oil, Galcanezumab-gnlm, traMADol, butalbital-acetaminophen-caffeine, Ubrogepant (UBRELVY PO), sertraline, traZODone, albuterol, Cyanocobalamin, estradiol, baclofen, montelukast, omeprazole, rosuvastatin, fexofenadine, Trelegy Ellipta, celecoxib, and Spiriva Respimat. We will continue to administer cyanocobalamin.  Allergies as of 10/25/2019      Reactions   Pregabalin Shortness Of Breath   Chlorzoxazone Other (See Comments)   Decrease in BP    Diclofenac Other (See Comments)   Other reaction(s): Other Other reaction(s): Other Update Update Update Update Other reaction(s): Other Other reaction(s): Other Update Update Update Update   Diclofenac Sodium    Other reaction(s): Other Update   Metoclopramide Other (See Comments)   Restless legs   Sibutramine    Other reaction(s): Other update   Topiramate Other (See Comments)   Venlafaxine Other (See Comments)   Restless legs   Amitriptyline    Duloxetine Hcl    Guaifenesin Er    Oxybutynin    Gabapentin    Other reaction(s): Confusion   Propranolol Anxiety      Medication List       Accurate as of October 25, 2019  9:25 PM. If you have any questions, ask your nurse or doctor.        STOP taking these medications   predniSONE 10 MG tablet Commonly known as: DELTASONE Stopped by: Amy Fraise, MD     TAKE these medications   acetaZOLAMIDE 250 MG tablet Commonly known as: DIAMOX Take 250 mg by mouth 2 (two) times daily.   albuterol 108 (90 Base) MCG/ACT inhaler Commonly known as: VENTOLIN HFA Inhale 2 puffs into the lungs every 6 (six) hours as needed for wheezing or shortness of breath.   Align  4 MG Caps   aspirin 325 MG EC tablet Take 1 tablet by mouth daily.   baclofen 10 MG tablet Commonly known as: LIORESAL   Botox 200 units Solr Generic drug: Botulinum Toxin Type A   butalbital-acetaminophen-caffeine 50-325-40 MG tablet Commonly known as: FIORICET   celecoxib 200 MG capsule Commonly known as: CELEBREX Take 200 mg by mouth daily.   Cyanocobalamin 1000 MCG/ML Kit Inject 1,000  mcg as directed every 30 (thirty) days.   Emgality 120 MG/ML Soaj Generic drug: Galcanezumab-gnlm Inject into the skin.   estradiol 0.5 MG tablet Commonly known as: ESTRACE Take 1 tablet (0.5 mg total) by mouth daily.   fexofenadine 180 MG tablet Commonly known as: ALLEGRA Take 1 tablet (180 mg total) by mouth daily. For allergy symptoms   IBgard 90 MG Cpcr Generic drug: Peppermint Oil   montelukast 10 MG tablet Commonly known as: SINGULAIR Take 1 tablet (10 mg total) by mouth at bedtime.   omeprazole 40 MG capsule Commonly known as: PRILOSEC Take 1 capsule (40 mg total) by mouth 2 (two) times daily.   rOPINIRole 4 MG tablet Commonly known as: REQUIP Take 1 tablet (4 mg total) by mouth at bedtime. Take 1 tablet (1 mg total) by mouth at bedtime. ( What changed:   medication strength  how much to take  how to take this  when to take this Changed by: Amy Fraise, MD   rosuvastatin 10 MG tablet Commonly known as: CRESTOR Take 1 tablet (10 mg total) by mouth daily.   sertraline 100 MG tablet Commonly known as: ZOLOFT Take 1 tablet by mouth daily. For the first week take only 1/2 tabletdaily, Then increase to a full dose.   Spiriva Respimat 1.25 MCG/ACT Aers Generic drug: Tiotropium Bromide Monohydrate Inhale into the lungs.   traMADol 50 MG tablet Commonly known as: ULTRAM Take 1 tablet (50 mg total) by mouth every 6 (six) hours as needed.   traZODone 150 MG tablet Commonly known as: DESYREL Take 1 tablet (150 mg total) by mouth at bedtime.   Trelegy Ellipta  100-62.5-25 MCG/INH Aepb Generic drug: Fluticasone-Umeclidin-Vilant Inhale 1 puff into the lungs daily.   UBRELVY PO Take by mouth.   Vitamin D 50 MCG (2000 UT) Caps Take 5,000 Units by mouth daily.        Follow-up: Return in about 3 months (around 01/25/2020), or if symptoms worsen or fail to improve.  Amy Gray, M.D.

## 2019-10-26 LAB — BMP8+EGFR
BUN/Creatinine Ratio: 24 — ABNORMAL HIGH (ref 9–23)
BUN: 20 mg/dL (ref 6–24)
CO2: 23 mmol/L (ref 20–29)
Calcium: 9.3 mg/dL (ref 8.7–10.2)
Chloride: 107 mmol/L — ABNORMAL HIGH (ref 96–106)
Creatinine, Ser: 0.83 mg/dL (ref 0.57–1.00)
GFR calc Af Amer: 89 mL/min/{1.73_m2} (ref >59)
GFR calc non Af Amer: 77 mL/min/{1.73_m2} (ref >59)
Glucose: 80 mg/dL (ref 65–99)
Potassium: 3.8 mmol/L (ref 3.5–5.2)
Sodium: 143 mmol/L (ref 134–144)

## 2019-10-28 DIAGNOSIS — R52 Pain, unspecified: Secondary | ICD-10-CM | POA: Diagnosis not present

## 2019-10-28 DIAGNOSIS — R252 Cramp and spasm: Secondary | ICD-10-CM | POA: Diagnosis not present

## 2019-10-28 DIAGNOSIS — R609 Edema, unspecified: Secondary | ICD-10-CM | POA: Diagnosis not present

## 2019-10-28 DIAGNOSIS — M79605 Pain in left leg: Secondary | ICD-10-CM | POA: Diagnosis not present

## 2019-10-28 DIAGNOSIS — M79604 Pain in right leg: Secondary | ICD-10-CM | POA: Diagnosis not present

## 2019-10-30 NOTE — Progress Notes (Signed)
Hello Amy Gray,  Your lab result is normal and/or stable.Some minor variations that are not significant are commonly marked abnormal, but do not represent any medical problem for you.  Best regards, Nakeisha Greenhouse, M.D.

## 2019-11-09 ENCOUNTER — Other Ambulatory Visit: Payer: Self-pay | Admitting: Family Medicine

## 2019-11-09 DIAGNOSIS — F99 Mental disorder, not otherwise specified: Secondary | ICD-10-CM

## 2019-11-09 DIAGNOSIS — M797 Fibromyalgia: Secondary | ICD-10-CM

## 2019-11-09 DIAGNOSIS — F5105 Insomnia due to other mental disorder: Secondary | ICD-10-CM

## 2019-11-10 DIAGNOSIS — J8283 Eosinophilic asthma: Secondary | ICD-10-CM | POA: Diagnosis not present

## 2019-11-21 DIAGNOSIS — H16223 Keratoconjunctivitis sicca, not specified as Sjogren's, bilateral: Secondary | ICD-10-CM | POA: Diagnosis not present

## 2019-11-21 DIAGNOSIS — G932 Benign intracranial hypertension: Secondary | ICD-10-CM | POA: Diagnosis not present

## 2019-11-21 DIAGNOSIS — H5213 Myopia, bilateral: Secondary | ICD-10-CM | POA: Diagnosis not present

## 2019-11-21 DIAGNOSIS — H52223 Regular astigmatism, bilateral: Secondary | ICD-10-CM | POA: Diagnosis not present

## 2019-11-21 DIAGNOSIS — H524 Presbyopia: Secondary | ICD-10-CM | POA: Diagnosis not present

## 2019-11-21 DIAGNOSIS — H52209 Unspecified astigmatism, unspecified eye: Secondary | ICD-10-CM | POA: Diagnosis not present

## 2019-11-23 ENCOUNTER — Other Ambulatory Visit: Payer: Self-pay | Admitting: Family Medicine

## 2019-11-23 DIAGNOSIS — G2581 Restless legs syndrome: Secondary | ICD-10-CM

## 2019-11-23 DIAGNOSIS — M797 Fibromyalgia: Secondary | ICD-10-CM

## 2019-11-28 ENCOUNTER — Other Ambulatory Visit: Payer: Self-pay | Admitting: Family Medicine

## 2019-11-28 DIAGNOSIS — E538 Deficiency of other specified B group vitamins: Secondary | ICD-10-CM

## 2019-12-08 DIAGNOSIS — K219 Gastro-esophageal reflux disease without esophagitis: Secondary | ICD-10-CM | POA: Diagnosis not present

## 2019-12-08 DIAGNOSIS — R0600 Dyspnea, unspecified: Secondary | ICD-10-CM | POA: Diagnosis not present

## 2019-12-08 DIAGNOSIS — J8283 Eosinophilic asthma: Secondary | ICD-10-CM | POA: Diagnosis not present

## 2019-12-08 DIAGNOSIS — J4550 Severe persistent asthma, uncomplicated: Secondary | ICD-10-CM | POA: Diagnosis not present

## 2019-12-08 DIAGNOSIS — J310 Chronic rhinitis: Secondary | ICD-10-CM | POA: Diagnosis not present

## 2019-12-09 ENCOUNTER — Other Ambulatory Visit: Payer: Self-pay | Admitting: Family Medicine

## 2019-12-09 DIAGNOSIS — F99 Mental disorder, not otherwise specified: Secondary | ICD-10-CM

## 2019-12-09 DIAGNOSIS — M797 Fibromyalgia: Secondary | ICD-10-CM

## 2019-12-09 DIAGNOSIS — F5105 Insomnia due to other mental disorder: Secondary | ICD-10-CM

## 2019-12-27 DIAGNOSIS — G43709 Chronic migraine without aura, not intractable, without status migrainosus: Secondary | ICD-10-CM | POA: Diagnosis not present

## 2019-12-28 ENCOUNTER — Other Ambulatory Visit: Payer: Self-pay | Admitting: Family Medicine

## 2019-12-28 DIAGNOSIS — G2581 Restless legs syndrome: Secondary | ICD-10-CM

## 2019-12-28 DIAGNOSIS — M797 Fibromyalgia: Secondary | ICD-10-CM

## 2020-01-05 DIAGNOSIS — J8283 Eosinophilic asthma: Secondary | ICD-10-CM | POA: Diagnosis not present

## 2020-01-23 ENCOUNTER — Ambulatory Visit: Payer: Medicare HMO | Admitting: Family Medicine

## 2020-01-25 ENCOUNTER — Other Ambulatory Visit: Payer: Self-pay | Admitting: Family Medicine

## 2020-01-25 DIAGNOSIS — G2581 Restless legs syndrome: Secondary | ICD-10-CM

## 2020-01-25 DIAGNOSIS — M797 Fibromyalgia: Secondary | ICD-10-CM

## 2020-01-26 ENCOUNTER — Other Ambulatory Visit: Payer: Self-pay

## 2020-01-26 ENCOUNTER — Encounter: Payer: Self-pay | Admitting: Family Medicine

## 2020-01-26 ENCOUNTER — Ambulatory Visit: Payer: Medicare Other | Admitting: Family Medicine

## 2020-01-26 ENCOUNTER — Ambulatory Visit (INDEPENDENT_AMBULATORY_CARE_PROVIDER_SITE_OTHER): Payer: Medicare Other | Admitting: Family Medicine

## 2020-01-26 VITALS — BP 101/76 | HR 82 | Temp 97.5°F | Ht 62.0 in | Wt 171.6 lb

## 2020-01-26 DIAGNOSIS — E538 Deficiency of other specified B group vitamins: Secondary | ICD-10-CM | POA: Diagnosis not present

## 2020-01-26 DIAGNOSIS — E782 Mixed hyperlipidemia: Secondary | ICD-10-CM

## 2020-01-26 DIAGNOSIS — E559 Vitamin D deficiency, unspecified: Secondary | ICD-10-CM | POA: Diagnosis not present

## 2020-01-26 DIAGNOSIS — M797 Fibromyalgia: Secondary | ICD-10-CM

## 2020-01-26 DIAGNOSIS — F99 Mental disorder, not otherwise specified: Secondary | ICD-10-CM

## 2020-01-26 DIAGNOSIS — G43709 Chronic migraine without aura, not intractable, without status migrainosus: Secondary | ICD-10-CM

## 2020-01-26 DIAGNOSIS — G912 (Idiopathic) normal pressure hydrocephalus: Secondary | ICD-10-CM

## 2020-01-26 DIAGNOSIS — F5105 Insomnia due to other mental disorder: Secondary | ICD-10-CM

## 2020-01-26 MED ORDER — KETOROLAC TROMETHAMINE 60 MG/2ML IM SOLN
60.0000 mg | Freq: Once | INTRAMUSCULAR | Status: AC
  Administered 2020-01-26: 60 mg via INTRAMUSCULAR

## 2020-01-26 MED ORDER — ALBUTEROL SULFATE HFA 108 (90 BASE) MCG/ACT IN AERS: 2.0000 | INHALATION_SPRAY | Freq: Four times a day (QID) | RESPIRATORY_TRACT | 11 refills | Status: DC | PRN

## 2020-01-26 NOTE — Progress Notes (Signed)
Subjective:  Patient ID: Amy Gray, female    DOB: 1960-04-19  Age: 60 y.o. MRN: [id number]  CC: Follow-up (3 month/)   HPI Aronda Burford presents for 67-monthfollow-up for her medical conditions including restless legs normal pressure hydrocephalus hypertension cholesterol and asthma.  Currently she is having a bad migraine.  She uses Ubrelvy usually without any problems however it did not give her relief yesterday and she got no sleep last night.  She is also having intermittent neck pain.  She tells me she is on a preventive medication from her neurologist PA.  However she is having weekly headaches.  She does not know the name of the preventive medication.  Her restless leg flares up when she has the headache.  She is able to use just 2 mg the night of the propanolol usually.  But if she has a headache she will have to use the full 4 mg and still sometimes have restless leg symptoms.  She requests a Toradol shot to help give her extra relief.  Patient is receiving 5000 units of vitamin D over-the-counter due to history of low vitamin D.  She would like to have her level checked to see if that is adequate.  She is seeing pulmonology and they have switched her back to the Symbicort 160.  She is using the albuterol as well as needed and Spiriva daily.  This seems to be holding her asthma symptoms under control. [facility]2020 10/25/2019 09/28/2019  Decreased Interest 0 0 0  Down, Depressed, Hopeless 0 0 0  PHQ - 2 Score 0 0 0  Altered sleeping - - -  Tired, decreased energy - - -  Change in appetite - - -  Feeling bad or failure about yourself  - - -  Trouble concentrating - - -  Moving slowly or fidgety/restless - - -  Suicidal thoughts - - -  PHQ-9 Score - - -    History [place] a past medical history of Anxiety, Asthma, Depression, GERD (gastroesophageal reflux disease), Hyperlipidemia, Migraines, and Osteoarthritis.   She has a past surgical history  that includes Cervical fusion (2015); Rhinoplasty; and Abdominal hysterectomy.   Her family history includes Arthritis in her father; Asthma in her mother; Heart disease in her father and mother; Hypertension in her sister.She reports that she has never smoked. She has never used smokeless tobacco. She reports that she does not drink alcohol and does not use drugs.    ROS Review of Systems  Constitutional: Negative.   HENT: Negative.   Eyes: Negative for visual disturbance.  Respiratory: Positive for shortness of breath (intermittently. Controlled with current asthma regimen).   Cardiovascular: Negative for chest pain.  Gastrointestinal: Negative for abdominal pain.  Musculoskeletal: Positive for arthralgias and myalgias.  Neurological: Positive for headaches.    Objective:  BP 101/76   Pulse 82   Temp (!) 97.5 F (36.4 C) (Temporal)   Ht 5' 2" (1.575 m)   Wt 171 lb 9.6 oz (77.8 kg)   BMI 31.39 kg/m   BP Readings from Last 3 Encounters:  01/26/20 101/76  10/25/19 117/77  09/28/19 105/73    Wt Readings from Last 3 Encounters:  01/26/20 171 lb 9.6 oz (77.8 kg)  10/25/19 167 lb (75.8 kg)  09/28/19 169 lb 9.6 oz (76.9 kg)     Physical Exam Constitutional:      General: She is not in acute distress.    Appearance: She is well-developed.  HENT:     Head: Normocephalic and atraumatic.  Eyes:     Conjunctiva/sclera: Conjunctivae normal.     Pupils: Pupils are equal, round, and reactive to light.  Neck:     Thyroid: No thyromegaly.  Cardiovascular:     Rate and Rhythm: Normal rate and regular rhythm.     Heart sounds: Normal heart sounds. No murmur heard.   Pulmonary:     Effort: Pulmonary effort is normal. No respiratory distress.     Breath sounds: Normal breath sounds. No wheezing or rales.  Abdominal:     General: Bowel sounds are normal. There is no distension.     Palpations: Abdomen is soft.     Tenderness: There is no abdominal tenderness.   Musculoskeletal:        General: Normal range of motion.     Cervical back: Normal range of motion and neck supple.  Lymphadenopathy:     Cervical: No cervical adenopathy.  Skin:    General: Skin is warm and dry.  Neurological:     Mental Status: She is alert and oriented to person, place, and time.  Psychiatric:        Behavior: Behavior normal.        Thought Content: Thought content normal.        Judgment: Judgment normal.       Assessment & Plan:   Christean was seen today for follow-up.  Diagnoses and all orders for this visit:  Vitamin D deficiency -     VITAMIN D 25 Hydroxy (Vit-D Deficiency, Fractures)  Vitamin B12 deficiency -     CBC with Differential/Platelet  Chronic migraine without aura without status migrainosus, not intractable -     ketorolac (TORADOL) injection 60 mg -     CBC with Differential/Platelet -     CMP14+EGFR  Fibromyalgia syndrome -     CBC with Differential/Platelet -     CMP14+EGFR  Normal pressure hydrocephalus (HCC) -     CBC with Differential/Platelet -     CMP14+EGFR  Insomnia due to other mental disorder -     CBC with Differential/Platelet -     CMP14+EGFR  Mixed hyperlipidemia -     CBC with Differential/Platelet -     CMP14+EGFR -     Lipid panel  Other orders -     albuterol (VENTOLIN HFA) 108 (90 Base) MCG/ACT inhaler; Inhale 2 puffs into the lungs every 6 (six) hours as needed for wheezing or shortness of breath.       I have discontinued Adalyne Siska's Galcanezumab-gnlm. I am also having her maintain her Vitamin D, aspirin, acetaZOLAMIDE, Align, Botox, Peppermint Oil, traMADol, butalbital-acetaminophen-caffeine, Ubrogepant (UBRELVY PO), sertraline, estradiol, baclofen, montelukast, omeprazole, rosuvastatin, fexofenadine, Trelegy Ellipta, Spiriva Respimat, cyanocobalamin, traZODone, rOPINIRole, and albuterol. We administered ketorolac. We will continue to administer cyanocobalamin.  Allergies as of 01/26/2020       Reactions   Pregabalin Shortness Of Breath   Chlorzoxazone Other (See Comments)   Decrease in BP    Diclofenac Other (See Comments)   Other reaction(s): Other Other reaction(s): Other Update Update Update Update Other reaction(s): Other Other reaction(s): Other Update Update Update Update   Diclofenac Sodium    Other reaction(s): Other Update   Metoclopramide Other (See Comments)   Restless legs   Sibutramine    Other reaction(s): Other update   Topiramate Other (See Comments)   Venlafaxine Other (See Comments)   Restless legs   Amitriptyline    Duloxetine Hcl      Guaifenesin Er    Oxybutynin    Gabapentin    Other reaction(s): Confusion   Propranolol Anxiety      Medication List       Accurate as of January 26, 2020  1:46 PM. If you have any questions, ask your nurse or doctor.        STOP taking these medications   Emgality 120 MG/ML Soaj Generic drug: Galcanezumab-gnlm Stopped by: Claretta Fraise, MD     TAKE these medications   acetaZOLAMIDE 250 MG tablet Commonly known as: DIAMOX Take 250 mg by mouth 2 (two) times daily.   albuterol 108 (90 Base) MCG/ACT inhaler Commonly known as: VENTOLIN HFA Inhale 2 puffs into the lungs every 6 (six) hours as needed for wheezing or shortness of breath.   Align 4 MG Caps   aspirin 325 MG EC tablet Take 1 tablet by mouth daily.   baclofen 10 MG tablet Commonly known as: LIORESAL   Botox 200 units Solr Generic drug: Botulinum Toxin Type A   butalbital-acetaminophen-caffeine 50-325-40 MG tablet Commonly known as: FIORICET   cyanocobalamin 1000 MCG/ML injection Commonly known as: (VITAMIN B-12) Inject 1,000 mcg as directed every 30 (thirty) days.   estradiol 0.5 MG tablet Commonly known as: ESTRACE Take 1 tablet (0.5 mg total) by mouth daily.   fexofenadine 180 MG tablet Commonly known as: ALLEGRA Take 1 tablet (180 mg total) by mouth daily. For allergy symptoms   montelukast 10 MG  tablet Commonly known as: SINGULAIR Take 1 tablet (10 mg total) by mouth at bedtime.   omeprazole 40 MG capsule Commonly known as: PRILOSEC Take 1 capsule (40 mg total) by mouth 2 (two) times daily.   Peppermint Oil 90 MG Cpcr   rOPINIRole 4 MG tablet Commonly known as: REQUIP Take 1 tablet (4 mg total) by mouth at bedtime.   rosuvastatin 10 MG tablet Commonly known as: CRESTOR Take 1 tablet (10 mg total) by mouth daily.   sertraline 100 MG tablet Commonly known as: ZOLOFT Take 1 tablet by mouth daily. For the first week take only 1/2 tabletdaily, Then increase to a full dose.   Spiriva Respimat 1.25 MCG/ACT Aers Generic drug: Tiotropium Bromide Monohydrate Inhale into the lungs.   traMADol 50 MG tablet Commonly known as: ULTRAM Take 1 tablet (50 mg total) by mouth every 6 (six) hours as needed.   traZODone 150 MG tablet Commonly known as: DESYREL TAKE 1/3 (ONE-THIRD) to 1 (ONE) tablet BY MOUTH nightly as needed for sleep.   Trelegy Ellipta 100-62.5-25 MCG/INH Aepb Generic drug: Fluticasone-Umeclidin-Vilant Inhale 1 puff into the lungs daily.   UBRELVY PO Take by mouth.   Vitamin D 50 MCG (2000 UT) Caps Take 5,000 Units by mouth daily.        Follow-up: Return in about 3 months (around 04/25/2020).  Claretta Fraise, M.D.

## 2020-01-27 LAB — CMP14+EGFR
ALT: 16 IU/L (ref 0–32)
AST: 18 IU/L (ref 0–40)
Albumin/Globulin Ratio: 1.5 (ref 1.2–2.2)
Albumin: 4 g/dL (ref 3.8–4.9)
Alkaline Phosphatase: 111 IU/L (ref 44–121)
BUN/Creatinine Ratio: 12 (ref 9–23)
BUN: 10 mg/dL (ref 6–24)
Bilirubin Total: <0.2 mg/dL (ref 0.0–1.2)
CO2: 20 mmol/L (ref 20–29)
Calcium: 8.9 mg/dL (ref 8.7–10.2)
Chloride: 108 mmol/L — ABNORMAL HIGH (ref 96–106)
Creatinine, Ser: 0.83 mg/dL (ref 0.57–1.00)
GFR calc Af Amer: 89 mL/min/{1.73_m2} (ref >59)
GFR calc non Af Amer: 77 mL/min/{1.73_m2} (ref >59)
Globulin, Total: 2.6 g/dL (ref 1.5–4.5)
Glucose: 95 mg/dL (ref 65–99)
Potassium: 4 mmol/L (ref 3.5–5.2)
Sodium: 140 mmol/L (ref 134–144)
Total Protein: 6.6 g/dL (ref 6.0–8.5)

## 2020-01-27 LAB — LIPID PANEL
Chol/HDL Ratio: 2.9 ratio (ref 0.0–4.4)
Cholesterol, Total: 174 mg/dL (ref 100–199)
HDL: 59 mg/dL (ref >39)
LDL Chol Calc (NIH): 90 mg/dL (ref 0–99)
Triglycerides: 143 mg/dL (ref 0–149)
VLDL Cholesterol Cal: 25 mg/dL (ref 5–40)

## 2020-01-27 LAB — CBC WITH DIFFERENTIAL/PLATELET
Basophils Absolute: 0.1 10*3/uL (ref 0.0–0.2)
Basos: 1 %
EOS (ABSOLUTE): 0.1 10*3/uL (ref 0.0–0.4)
Eos: 2 %
Hematocrit: 39.2 % (ref 34.0–46.6)
Hemoglobin: 13.2 g/dL (ref 11.1–15.9)
Immature Grans (Abs): 0 10*3/uL (ref 0.0–0.1)
Immature Granulocytes: 0 %
Lymphocytes Absolute: 1.5 10*3/uL (ref 0.7–3.1)
Lymphs: 28 %
MCH: 30.2 pg (ref 26.6–33.0)
MCHC: 33.7 g/dL (ref 31.5–35.7)
MCV: 90 fL (ref 79–97)
Monocytes Absolute: 0.6 10*3/uL (ref 0.1–0.9)
Monocytes: 11 %
Neutrophils Absolute: 3.1 10*3/uL (ref 1.4–7.0)
Neutrophils: 58 %
Platelets: 241 10*3/uL (ref 150–450)
RBC: 4.37 x10E6/uL (ref 3.77–5.28)
RDW: 12.7 % (ref 11.7–15.4)
WBC: 5.4 10*3/uL (ref 3.4–10.8)

## 2020-01-27 LAB — VITAMIN D 25 HYDROXY (VIT D DEFICIENCY, FRACTURES): Vit D, 25-Hydroxy: 66.1 ng/mL (ref 30.0–100.0)

## 2020-01-27 NOTE — Progress Notes (Signed)
Hello Mollee,  Your lab result is normal and/or stable.Some minor variations that are not significant are commonly marked abnormal, but do not represent any medical problem for you.  Best regards, Kalika Smay, M.D.

## 2020-01-31 ENCOUNTER — Other Ambulatory Visit: Payer: Self-pay | Admitting: Family Medicine

## 2020-01-31 ENCOUNTER — Encounter: Payer: Self-pay | Admitting: Family Medicine

## 2020-01-31 MED ORDER — BUDESONIDE 0.25 MG/2ML IN SUSP: 0.2500 mg | Freq: Two times a day (BID) | RESPIRATORY_TRACT | 12 refills | Status: DC | PRN

## 2020-01-31 MED ORDER — PREDNISONE 10 MG PO TABS
ORAL_TABLET | ORAL | 0 refills | Status: DC
Start: 2020-01-31 — End: 2020-03-15

## 2020-02-08 ENCOUNTER — Telehealth: Payer: Self-pay

## 2020-02-08 DIAGNOSIS — J984 Other disorders of lung: Secondary | ICD-10-CM | POA: Diagnosis not present

## 2020-02-08 DIAGNOSIS — U071 COVID-19: Secondary | ICD-10-CM

## 2020-02-08 DIAGNOSIS — Z20822 Contact with and (suspected) exposure to covid-19: Secondary | ICD-10-CM | POA: Diagnosis not present

## 2020-02-08 DIAGNOSIS — J45909 Unspecified asthma, uncomplicated: Secondary | ICD-10-CM | POA: Diagnosis not present

## 2020-02-08 NOTE — Telephone Encounter (Signed)
Please arrange. I don't think it takes a formal referral. Just call the center with her name and DOB

## 2020-02-09 ENCOUNTER — Encounter: Payer: Self-pay | Admitting: Family Medicine

## 2020-02-09 ENCOUNTER — Other Ambulatory Visit: Payer: Self-pay | Admitting: Family Medicine

## 2020-02-09 MED ORDER — AZITHROMYCIN 250 MG PO TABS: ORAL_TABLET | ORAL | 0 refills | Status: DC

## 2020-02-09 MED ORDER — HYDROXYCHLOROQUINE SULFATE 200 MG PO TABS: 400.0000 mg | ORAL_TABLET | Freq: Every day | ORAL | 0 refills | Status: DC

## 2020-02-09 NOTE — Telephone Encounter (Signed)
Pt aware referral was placed.

## 2020-02-10 ENCOUNTER — Telehealth: Payer: Self-pay

## 2020-02-10 ENCOUNTER — Ambulatory Visit (HOSPITAL_COMMUNITY)
Admission: RE | Admit: 2020-02-10 | Discharge: 2020-02-10 | Disposition: A | Discharge status: Home / Self Care | Payer: Medicare Other | Source: Ambulatory Visit | Attending: Pulmonary Disease | Admitting: Pulmonary Disease

## 2020-02-10 ENCOUNTER — Other Ambulatory Visit: Payer: Self-pay | Admitting: Infectious Diseases

## 2020-02-10 ENCOUNTER — Telehealth: Payer: Self-pay | Admitting: Infectious Diseases

## 2020-02-10 DIAGNOSIS — U071 COVID-19: Secondary | ICD-10-CM | POA: Diagnosis not present

## 2020-02-10 MED ORDER — FAMOTIDINE IN NACL 20-0.9 MG/50ML-% IV SOLN: 20.0000 mg | Freq: Once | INTRAVENOUS | Status: DC | PRN

## 2020-02-10 MED ORDER — EPINEPHRINE 0.3 MG/0.3ML IJ SOAJ: 0.3000 mg | Freq: Once | INTRAMUSCULAR | Status: DC | PRN

## 2020-02-10 MED ORDER — DIPHENHYDRAMINE HCL 50 MG/ML IJ SOLN: 50.0000 mg | Freq: Once | INTRAMUSCULAR | Status: DC | PRN

## 2020-02-10 MED ORDER — METHYLPREDNISOLONE SODIUM SUCC 125 MG IJ SOLR: 125.0000 mg | Freq: Once | INTRAMUSCULAR | Status: DC | PRN

## 2020-02-10 MED ORDER — SOTROVIMAB 500 MG/8ML IV SOLN
500.0000 mg | Freq: Once | INTRAVENOUS | Status: AC
  Administered 2020-02-10: 500 mg via INTRAVENOUS

## 2020-02-10 MED ORDER — ALBUTEROL SULFATE HFA 108 (90 BASE) MCG/ACT IN AERS: 2.0000 | INHALATION_SPRAY | Freq: Once | RESPIRATORY_TRACT | Status: DC | PRN

## 2020-02-10 MED ORDER — SODIUM CHLORIDE 0.9 % IV SOLN: INTRAVENOUS | Status: DC | PRN

## 2020-02-10 NOTE — Telephone Encounter (Signed)
Tuesday night of this week she noticed that she started feeling poorly. She had a negative home test on Saturday. Her husband started showing symptoms over the weekend and led her to repeat testing when she had new symptoms which was positive 1/19 at urgent care.  Unvaccinated - discussed treatment options and will proceed with sotrovimab infusion for her.    [name], MSN, NP-C [facility]  [email or web address] Pager: [phone] Office: [phone] [facility] Main Line: [phone]

## 2020-02-10 NOTE — Discharge Instructions (Signed)

## 2020-02-10 NOTE — Telephone Encounter (Signed)
Called to discuss with patient about COVID-19 symptoms and the use of one of the available treatments for those with mild to moderate Covid symptoms and at a high risk of hospitalization.  Pt appears to qualify for outpatient treatment due to co-morbid conditions and/or a member of an at-risk group in accordance with the FDA Emergency Use Authorization.    Symptom onset: 02/04/20 Vaccinated: No Booster? No Immunocompromised? No Qualifiers: Asthma  Unable to reach pt - Reached pt. Started Plaquenil 02/09/20 by her PCP. Amy Gray

## 2020-02-10 NOTE — Progress Notes (Signed)
I connected by phone with Amy Gray on 02/10/2020 at 12:55 PM to discuss the potential use of a new treatment for mild to moderate COVID-19 viral infection in non-hospitalized patients.  This patient is a 60 y.o. female that meets the FDA criteria for Emergency Use Authorization of COVID monoclonal antibody sotrovimab.  Has a (+) direct SARS-CoV-2 viral test result  Has mild or moderate COVID-19   Is NOT hospitalized due to COVID-19  Is within 10 days of symptom onset  Has at least one of the high risk factor(s) for progression to severe COVID-19 and/or hospitalization as defined in EUA.  Specific high risk criteria : BMI > 25, Chronic Lung Disease and Other high risk medical condition per CDC:  unvaccinated    I have spoken and communicated the following to the patient or parent/caregiver regarding COVID monoclonal antibody treatment:  1. FDA has authorized the emergency use for the treatment of mild to moderate COVID-19 in adults and pediatric patients with positive results of direct SARS-CoV-2 viral testing who are 38 years of age and older weighing at least 40 kg, and who are at high risk for progressing to severe COVID-19 and/or hospitalization.  2. The significant known and potential risks and benefits of COVID monoclonal antibody, and the extent to which such potential risks and benefits are unknown.  3. Information on available alternative treatments and the risks and benefits of those alternatives, including clinical trials.  4. Patients treated with COVID monoclonal antibody should continue to self-isolate and use infection control measures (e.g., wear mask, isolate, social distance, avoid sharing personal items, clean and disinfect "high touch" surfaces, and frequent handwashing) according to CDC guidelines.   5. The patient or parent/caregiver has the option to accept or refuse COVID monoclonal antibody treatment.  After reviewing this information with the patient, the  patient has agreed to receive one of the available covid 19 monoclonal antibodies and will be provided an appropriate fact sheet prior to infusion. [name], NP 02/10/2020 12:55 PM

## 2020-02-10 NOTE — Progress Notes (Signed)
Diagnosis: COVID-19  Physician: Dr. Patrick Wright  Procedure: [facility]fusion [facility] Med: Sotrovimab infusion - Provided patient with sotrovimab fact sheet for patients, parents, and caregivers prior to infusion.   Complications: No immediate complications noted  Discharge: Discharged home    

## 2020-02-10 NOTE — Progress Notes (Signed)
Patient reviewed Fact Sheet for Patients, Parents, and Caregivers for Emergency Use Authorization (EUA) of sotrovimab for the Treatment of Coronavirus. Patient also reviewed and is agreeable to the estimated cost of treatment. Patient is agreeable to proceed.   

## 2020-02-12 DIAGNOSIS — Z7982 Long term (current) use of aspirin: Secondary | ICD-10-CM | POA: Diagnosis not present

## 2020-02-12 DIAGNOSIS — R0602 Shortness of breath: Secondary | ICD-10-CM | POA: Diagnosis not present

## 2020-02-12 DIAGNOSIS — Z888 Allergy status to other drugs, medicaments and biological substances status: Secondary | ICD-10-CM | POA: Diagnosis not present

## 2020-02-12 DIAGNOSIS — R918 Other nonspecific abnormal finding of lung field: Secondary | ICD-10-CM | POA: Diagnosis not present

## 2020-02-12 DIAGNOSIS — Z7951 Long term (current) use of inhaled steroids: Secondary | ICD-10-CM | POA: Diagnosis not present

## 2020-02-12 DIAGNOSIS — J1282 Pneumonia due to coronavirus disease 2019: Secondary | ICD-10-CM | POA: Diagnosis not present

## 2020-02-12 DIAGNOSIS — Z79899 Other long term (current) drug therapy: Secondary | ICD-10-CM | POA: Diagnosis not present

## 2020-02-12 DIAGNOSIS — Z886 Allergy status to analgesic agent status: Secondary | ICD-10-CM | POA: Diagnosis not present

## 2020-02-12 DIAGNOSIS — U071 COVID-19: Secondary | ICD-10-CM | POA: Diagnosis not present

## 2020-02-12 DIAGNOSIS — F419 Anxiety disorder, unspecified: Secondary | ICD-10-CM | POA: Diagnosis not present

## 2020-02-12 DIAGNOSIS — J45909 Unspecified asthma, uncomplicated: Secondary | ICD-10-CM | POA: Diagnosis not present

## 2020-02-12 DIAGNOSIS — M1990 Unspecified osteoarthritis, unspecified site: Secondary | ICD-10-CM | POA: Diagnosis not present

## 2020-02-12 DIAGNOSIS — E7800 Pure hypercholesterolemia, unspecified: Secondary | ICD-10-CM | POA: Diagnosis not present

## 2020-02-12 DIAGNOSIS — E876 Hypokalemia: Secondary | ICD-10-CM | POA: Diagnosis not present

## 2020-02-12 DIAGNOSIS — Z7952 Long term (current) use of systemic steroids: Secondary | ICD-10-CM | POA: Diagnosis not present

## 2020-02-12 DIAGNOSIS — K219 Gastro-esophageal reflux disease without esophagitis: Secondary | ICD-10-CM | POA: Diagnosis not present

## 2020-02-13 ENCOUNTER — Telehealth: Payer: Self-pay

## 2020-02-13 NOTE — Telephone Encounter (Signed)
Telephone visit scheduled with Jannifer Rodney on 02/14/2020.

## 2020-02-14 ENCOUNTER — Ambulatory Visit (INDEPENDENT_AMBULATORY_CARE_PROVIDER_SITE_OTHER): Payer: Medicare Other | Admitting: Family

## 2020-02-14 ENCOUNTER — Telehealth: Payer: Self-pay

## 2020-02-14 ENCOUNTER — Encounter: Payer: Self-pay | Admitting: Family

## 2020-02-14 DIAGNOSIS — Z09 Encounter for follow-up examination after completed treatment for conditions other than malignant neoplasm: Secondary | ICD-10-CM

## 2020-02-14 DIAGNOSIS — J45901 Unspecified asthma with (acute) exacerbation: Secondary | ICD-10-CM | POA: Diagnosis not present

## 2020-02-14 DIAGNOSIS — J1282 Pneumonia due to coronavirus disease 2019: Secondary | ICD-10-CM

## 2020-02-14 DIAGNOSIS — U071 COVID-19: Secondary | ICD-10-CM

## 2020-02-14 MED ORDER — BUDESONIDE 0.25 MG/2ML IN SUSP
0.2500 mg | Freq: Two times a day (BID) | RESPIRATORY_TRACT | 12 refills | Status: DC | PRN
Start: 2020-02-14 — End: 2020-10-09

## 2020-02-14 MED ORDER — ALBUTEROL SULFATE HFA 108 (90 BASE) MCG/ACT IN AERS
2.0000 | INHALATION_SPRAY | Freq: Four times a day (QID) | RESPIRATORY_TRACT | 11 refills | Status: DC | PRN
Start: 2020-02-14 — End: 2020-02-20

## 2020-02-14 NOTE — Telephone Encounter (Signed)
appt made for 02/22/20 pt aware

## 2020-02-14 NOTE — Progress Notes (Signed)
Virtual Visit via telephone Note Due to COVID-19 pandemic this visit was conducted virtually. This visit type was conducted due to national recommendations for restrictions regarding the COVID-19 Pandemic (e.g. social distancing, sheltering in place) in an effort to limit this patient's exposure and mitigate transmission in our community. All issues noted in this document were discussed and addressed.  A physical exam was not performed with this format.  I connected with Amy Gray on 02/14/20 at 12:09 pm  by telephone and verified that I am speaking with the correct person using two identifiers. Amy Gray is currently located at home  and husband  is currently with her during visit. The provider, Jannifer Rodney, FNP is located in their office at time of visit.  I discussed the limitations, risks, security and privacy concerns of performing an evaluation and management service by telephone and the availability of in person appointments. I also discussed with the patient that there may be a patient responsible charge related to this service. The patient expressed understanding and agreed to proceed.   History and Present Illness:  Pt calls the office today with worsening COVID symptoms. She was diagnosed with COVID on 02/08/20.  She went to the ED with worsening symptoms on 02/12/20. She was given hydration and potassium because of hypokalemia.  She had MAB infusion on 02/09/20.   She completed a Zpak and currently taking prednisone.  Cough This is a new problem. The current episode started 1 to 4 weeks ago. The problem has been gradually improving. The problem occurs every few minutes. The cough is non-productive. Associated symptoms include headaches, myalgias, nasal congestion, rhinorrhea and shortness of breath. Pertinent negatives include no chills, ear congestion, ear pain or fever.      Review of Systems  Constitutional: Negative for chills and fever.  HENT: Positive for  rhinorrhea. Negative for ear pain.   Respiratory: Positive for cough and shortness of breath.   Musculoskeletal: Positive for myalgias.  Neurological: Positive for headaches.     Observations/Objective: No SOB or distress noted, pt reports her O2 is 96 %  Assessment and Plan:  1. Pneumonia due to COVID-19 virus Rest Continue prednisone  Use albuterol and pulmicort as need needed Report worsening symptoms Will sign her up for COVID monitoring program - MyChart COVID-19 home monitoring program; Future - albuterol (VENTOLIN HFA) 108 (90 Base) MCG/ACT inhaler; Inhale 2 puffs into the lungs every 6 (six) hours as needed for wheezing or shortness of breath.  Dispense: 8.5 g; Refill: 11 - budesonide (PULMICORT) 0.25 MG/2ML nebulizer solution; Take 2 mLs (0.25 mg total) by nebulization 2 (two) times daily as needed (shortness of breath).  Dispense: 60 mL; Refill: 12  2. COVID-19 virus detected - MyChart COVID-19 home monitoring program; Future - albuterol (VENTOLIN HFA) 108 (90 Base) MCG/ACT inhaler; Inhale 2 puffs into the lungs every 6 (six) hours as needed for wheezing or shortness of breath.  Dispense: 8.5 g; Refill: 11 - budesonide (PULMICORT) 0.25 MG/2ML nebulizer solution; Take 2 mLs (0.25 mg total) by nebulization 2 (two) times daily as needed (shortness of breath).  Dispense: 60 mL; Refill: 12  3. Moderate asthma with exacerbation, unspecified whether persistent  4. Hospital discharge follow-up Hospital and chest x reviewed      I discussed the assessment and treatment plan with the patient. The patient was provided an opportunity to ask questions and all were answered. The patient agreed with the plan and demonstrated an understanding of the instructions.   The patient  was advised to call back or seek an in-person evaluation if the symptoms worsen or if the condition fails to improve as anticipated.  The above assessment and management plan was discussed with the patient. The  patient verbalized understanding of and has agreed to the management plan. Patient is aware to call the clinic if symptoms persist or worsen. Patient is aware when to return to the clinic for a follow-up visit. Patient educated on when it is appropriate to go to the emergency department.   Time call ended:  12:30 pm   I provided 21 minutes of non-face-to-face time during this encounter.    Jannifer Rodney, FNP

## 2020-02-15 ENCOUNTER — Encounter: Payer: Self-pay | Admitting: Family Medicine

## 2020-02-16 ENCOUNTER — Telehealth: Payer: Self-pay

## 2020-02-16 NOTE — Telephone Encounter (Signed)
Phone call to pt. Due to BPA triggered on MyChart questionnaire for worsening cough.  Pt. Stated her cough is "dry", and is worse with laying down.  Reported she is noticing the cough is not as bad since she has been up today.  Is using Promethazine DM for her cough; somewhat helpful.  Reported has had chest tightness and shortness of breath for past week.  Feels this is a little better today.  Denied fever. Pulse ox. Reported in the mid-90's.  Stated she only has 2 nebulizer doses left, and has sent a message to Dr. Darlyn Read, to make him aware.  Stated the Nebulizer treatments really help her.  Per pt., the pharmacy advised she cannot get a refill until next Thurs.  Discussed using a humidifier at the bedside. Advised that 2 tsp. Pure honey can quiet a cough, and may be helpful, when she is in between doses of cough medication.  Encouraged to continue to monitor for any worsening of her shortness of breath and chest tightness, and may need to go to ER, if becomes increased.  Encouraged to contact Dr. Darlyn Read to report any worsening symptoms, also.  Pt. Verb. Understanding.   Questions answered.

## 2020-02-17 MED ORDER — FLUTICASONE PROPIONATE HFA 220 MCG/ACT IN AERO: 2.0000 | INHALATION_SPRAY | Freq: Two times a day (BID) | RESPIRATORY_TRACT | 1 refills | Status: DC

## 2020-02-19 ENCOUNTER — Encounter: Payer: Self-pay | Admitting: Family Medicine

## 2020-02-20 ENCOUNTER — Other Ambulatory Visit: Payer: Self-pay | Admitting: Family

## 2020-02-20 ENCOUNTER — Telehealth: Payer: Self-pay | Admitting: *Deleted

## 2020-02-20 DIAGNOSIS — U071 COVID-19: Secondary | ICD-10-CM

## 2020-02-20 DIAGNOSIS — J1282 Pneumonia due to coronavirus disease 2019: Secondary | ICD-10-CM

## 2020-02-20 MED ORDER — ALBUTEROL SULFATE HFA 108 (90 BASE) MCG/ACT IN AERS
2.0000 | INHALATION_SPRAY | Freq: Four times a day (QID) | RESPIRATORY_TRACT | 11 refills | Status: DC | PRN
Start: 2020-02-20 — End: 2020-10-09

## 2020-02-20 MED ORDER — ALBUTEROL SULFATE (2.5 MG/3ML) 0.083% IN NEBU: 2.5000 mg | INHALATION_SOLUTION | RESPIRATORY_TRACT | 2 refills | Status: DC | PRN

## 2020-02-20 NOTE — Telephone Encounter (Signed)
Call to patient- patient states she is doing better with her breathing today. Patient states she has sent a message to her provider to see if a change in her nebulizer treatment would be helpful and she is awaiting response. Patient state she is not coughing up phlem- but does have some chest tightness at times. Encouraged patient to contact PCP today for follow up and she states she is going to.  Advised if symptoms become severe,ie shortness of breath at rest, gasping for air, wheezing- seek treatment in the ED.

## 2020-02-22 ENCOUNTER — Ambulatory Visit (INDEPENDENT_AMBULATORY_CARE_PROVIDER_SITE_OTHER): Payer: Medicare Other | Admitting: Family Medicine

## 2020-02-22 ENCOUNTER — Encounter: Payer: Self-pay | Admitting: Family Medicine

## 2020-02-22 ENCOUNTER — Ambulatory Visit (INDEPENDENT_AMBULATORY_CARE_PROVIDER_SITE_OTHER): Payer: Medicare Other

## 2020-02-22 VITALS — BP 114/70 | HR 92 | Temp 98.0°F

## 2020-02-22 DIAGNOSIS — R059 Cough, unspecified: Secondary | ICD-10-CM | POA: Diagnosis not present

## 2020-02-22 DIAGNOSIS — J449 Chronic obstructive pulmonary disease, unspecified: Secondary | ICD-10-CM | POA: Diagnosis not present

## 2020-02-22 DIAGNOSIS — U071 COVID-19: Secondary | ICD-10-CM

## 2020-02-22 DIAGNOSIS — E860 Dehydration: Secondary | ICD-10-CM

## 2020-02-22 DIAGNOSIS — E876 Hypokalemia: Secondary | ICD-10-CM | POA: Diagnosis not present

## 2020-02-22 DIAGNOSIS — R0600 Dyspnea, unspecified: Secondary | ICD-10-CM | POA: Diagnosis not present

## 2020-02-22 LAB — URINALYSIS
Bilirubin, UA: NEGATIVE
Glucose, UA: NEGATIVE
Ketones, UA: NEGATIVE
Leukocytes,UA: NEGATIVE
Nitrite, UA: NEGATIVE
RBC, UA: NEGATIVE
Specific Gravity, UA: >1.03 — ABNORMAL HIGH (ref 1.005–1.030)
Urobilinogen, Ur: 0.2 mg/dL (ref 0.2–1.0)
pH, UA: 7 (ref 5.0–7.5)

## 2020-02-22 MED ORDER — DEXAMETHASONE 6 MG PO TABS: 6.0000 mg | ORAL_TABLET | Freq: Two times a day (BID) | ORAL | 0 refills | Status: DC

## 2020-02-22 MED ORDER — AZITHROMYCIN 250 MG PO TABS: ORAL_TABLET | ORAL | 0 refills | Status: DC

## 2020-02-22 NOTE — Progress Notes (Signed)
Subjective:  Patient ID: Amy Gray, female    DOB: 16-Dec-1960  Age: 60 y.o. MRN: [id number]  CC: Covid Positive   HPI Amy Gray presents for follow-up of her Covid bronchitis.  She was diagnosed approximately on the 24th.  She went to urgent care and was diagnosed and started on treatment.    2 days later on about the 28th was given monoclonal antibodies.  She remains weak, washed out with poor appetite. She was given IV Fluids for dehydration.  She is not coughing but she is easily winded.  She had an x-ray at the [facility] emergency room 3 days ago that showed the beginnings of Covid pneumonia.  Depression screen [facility] 2/9 01/26/2020 10/25/2019 09/28/2019  Decreased Interest 0 0 0  Down, Depressed, Hopeless 0 0 0  PHQ - 2 Score 0 0 0  Altered sleeping - - -  Tired, decreased energy - - -  Change in appetite - - -  Feeling bad or failure about yourself  - - -  Trouble concentrating - - -  Moving slowly or fidgety/restless - - -  Suicidal thoughts - - -  PHQ-9 Score - - -    History Amy Gray has a past medical history of Anxiety, Asthma, Depression, GERD (gastroesophageal reflux disease), Hyperlipidemia, Migraines, and Osteoarthritis.   She has a past surgical history that includes Cervical fusion (2015); Rhinoplasty; and Abdominal hysterectomy.   Her family history includes Arthritis in her father; Asthma in her mother; Heart disease in her father and mother; Hypertension in her sister.She reports that she has never smoked. She has never used smokeless tobacco. She reports that she does not drink alcohol and does not use drugs.    ROS Review of Systems  Constitutional: Positive for diaphoresis, fatigue and fever.  HENT: Negative.   Eyes: Negative for visual disturbance.  Respiratory: Negative for shortness of breath.   Cardiovascular: Negative for chest pain.  Gastrointestinal: Negative for abdominal pain.  Genitourinary: Positive for enuresis.  Musculoskeletal:  Positive for myalgias. Negative for arthralgias.  Neurological: Positive for weakness.    Objective:  BP 114/70   Pulse 92   Temp 98 F (36.7 C) (Temporal)   SpO2 97% Comment: room air  BP Readings from Last 3 Encounters:  02/22/20 114/70  02/10/20 114/78  01/26/20 101/76    Wt Readings from Last 3 Encounters:  01/26/20 171 lb 9.6 oz (77.8 kg)  10/25/19 167 lb (75.8 kg)  09/28/19 169 lb 9.6 oz (76.9 kg)     Physical Exam Constitutional:      General: She is not in acute distress.    Appearance: She is well-developed. She is ill-appearing and diaphoretic.  HENT:     Head: Normocephalic and atraumatic.  Eyes:     Conjunctiva/sclera: Conjunctivae normal.     Pupils: Pupils are equal, round, and reactive to light.  Neck:     Thyroid: No thyromegaly.  Cardiovascular:     Rate and Rhythm: Normal rate and regular rhythm.     Heart sounds: Normal heart sounds. No murmur heard.   Pulmonary:     Effort: Pulmonary effort is normal. No respiratory distress.     Breath sounds: Normal breath sounds. No wheezing or rales.  Abdominal:     General: Bowel sounds are normal. There is no distension.     Palpations: Abdomen is soft.     Tenderness: There is no abdominal tenderness.  Musculoskeletal:        General: Normal range of  motion.     Cervical back: Normal range of motion and neck supple.  Lymphadenopathy:     Cervical: No cervical adenopathy.  Skin:    General: Skin is warm.  Neurological:     Mental Status: She is alert and oriented to person, place, and time.  Psychiatric:        Behavior: Behavior normal.        Thought Content: Thought content normal.        Judgment: Judgment normal.    Results for orders placed or performed in visit on 02/22/20  Urinalysis  Result Value Ref Range   Specific Gravity, UA >1.030 (H) 1.005 - 1.030   pH, UA 7.0 5.0 - 7.5   Color, UA Yellow Yellow   Appearance Ur Clear Clear   Leukocytes,UA Negative Negative   Protein,UA 1+  (A) Negative/Trace   Glucose, UA Negative Negative   Ketones, UA Negative Negative   RBC, UA Negative Negative   Bilirubin, UA Negative Negative   Urobilinogen, Ur 0.2 0.2 - 1.0 mg/dL   Nitrite, UA Negative Negative  BMP8+EGFR  Result Value Ref Range   Glucose 89 65 - 99 mg/dL   BUN 11 6 - 24 mg/dL   Creatinine, Ser 0.80 0.57 - 1.00 mg/dL   GFR calc non Af Amer 81 >59 mL/min/1.73   GFR calc Af Amer 93 >59 mL/min/1.73   BUN/Creatinine Ratio 14 9 - 23   Sodium 144 134 - 144 mmol/L   Potassium 4.1 3.5 - 5.2 mmol/L   Chloride 108 (H) 96 - 106 mmol/L   CO2 21 20 - 29 mmol/L   Calcium 9.3 8.7 - 10.2 mg/dL  CBC with Differential/Platelet  Result Value Ref Range   WBC 8.0 3.4 - 10.8 x10E3/uL   RBC 4.62 3.77 - 5.28 x10E6/uL   Hemoglobin 13.7 11.1 - 15.9 g/dL   Hematocrit 41.7 34.0 - 46.6 %   MCV 90 79 - 97 fL   MCH 29.7 26.6 - 33.0 pg   MCHC 32.9 31.5 - 35.7 g/dL   RDW 12.6 11.7 - 15.4 %   Platelets 312 150 - 450 x10E3/uL   Neutrophils 65 Not Estab. %   Lymphs 24 Not Estab. %   Monocytes 9 Not Estab. %   Eos 0 Not Estab. %   Basos 1 Not Estab. %   Neutrophils Absolute 5.3 1.4 - 7.0 x10E3/uL   Lymphocytes Absolute 1.9 0.7 - 3.1 x10E3/uL   Monocytes Absolute 0.7 0.1 - 0.9 x10E3/uL   EOS (ABSOLUTE) 0.0 0.0 - 0.4 x10E3/uL   Basophils Absolute 0.1 0.0 - 0.2 x10E3/uL   Immature Granulocytes 1 Not Estab. %   Immature Grans (Abs) 0.1 0.0 - 0.1 x10E3/uL      Assessment & Plan:   Amy Gray was seen today for covid positive.  Diagnoses and all orders for this visit:  Dehydration  COVID-19 virus infection -     DG Chest 2 View; Future -     Urinalysis -     BMP8+EGFR -     CBC with Differential/Platelet  Hypokalemia -     Urinalysis -     BMP8+EGFR  Other orders -     dexamethasone (DECADRON) 6 MG tablet; Take 1 tablet (6 mg total) by mouth 2 (two) times daily with a meal. -     azithromycin (ZITHROMAX Z-PAK) 250 MG tablet; Take two right away Then one a day for the next 4  days.    Pt. Referred for IV  hydration.   I am having Amy Gray start on dexamethasone and azithromycin. I am also having her maintain her Vitamin D, aspirin, acetaZOLAMIDE, Align, Botox, Peppermint Oil, traMADol, butalbital-acetaminophen-caffeine, Ubrogepant (UBRELVY PO), sertraline, estradiol, baclofen, montelukast, omeprazole, rosuvastatin, fexofenadine, Trelegy Ellipta, Spiriva Respimat, cyanocobalamin, traZODone, rOPINIRole, predniSONE, hydroxychloroquine, budesonide, fluticasone, albuterol, and albuterol. We will continue to administer cyanocobalamin.  Allergies as of 02/22/2020      Reactions   Pregabalin Shortness Of Breath   Chlorzoxazone Other (See Comments)   Decrease in BP    Diclofenac Other (See Comments)   Other reaction(s): Other Other reaction(s): Other Update Update Update Update Other reaction(s): Other Other reaction(s): Other Update Update Update Update   Metoclopramide Other (See Comments)   Restless legs   Sibutramine    Other reaction(s): Other update   Topiramate Other (See Comments)   Venlafaxine Other (See Comments)   Restless legs   Amitriptyline    Duloxetine Hcl    Guaifenesin Er    Oxybutynin    Gabapentin    Other reaction(s): Confusion   Propranolol Anxiety      Medication List       Accurate as of February 22, 2020 11:59 PM. If you have any questions, ask your nurse or doctor.        acetaZOLAMIDE 250 MG tablet Commonly known as: DIAMOX Take 250 mg by mouth 2 (two) times daily.   albuterol 108 (90 Base) MCG/ACT inhaler Commonly known as: VENTOLIN HFA Inhale 2 puffs into the lungs every 6 (six) hours as needed for wheezing or shortness of breath.   albuterol (2.5 MG/3ML) 0.083% nebulizer solution Commonly known as: PROVENTIL Take 3 mLs (2.5 mg total) by nebulization every 4 (four) hours as needed for wheezing or shortness of breath.   Align 4 MG Caps   aspirin 325 MG EC tablet Take 1 tablet by mouth daily.    azithromycin 250 MG tablet Commonly known as: Zithromax Z-Pak Take two right away Then one a day for the next 4 days. Started by: Claretta Fraise, MD   baclofen 10 MG tablet Commonly known as: LIORESAL   Botox 200 units Solr Generic drug: Botulinum Toxin Type A   budesonide 0.25 MG/2ML nebulizer solution Commonly known as: PULMICORT Take 2 mLs (0.25 mg total) by nebulization 2 (two) times daily as needed (shortness of breath).   butalbital-acetaminophen-caffeine 50-325-40 MG tablet Commonly known as: FIORICET   cyanocobalamin 1000 MCG/ML injection Commonly known as: (VITAMIN B-12) Inject 1,000 mcg as directed every 30 (thirty) days.   dexamethasone 6 MG tablet Commonly known as: DECADRON Take 1 tablet (6 mg total) by mouth 2 (two) times daily with a meal. Started by: Claretta Fraise, MD   estradiol 0.5 MG tablet Commonly known as: ESTRACE Take 1 tablet (0.5 mg total) by mouth daily.   fexofenadine 180 MG tablet Commonly known as: ALLEGRA Take 1 tablet (180 mg total) by mouth daily. For allergy symptoms   fluticasone 220 MCG/ACT inhaler Commonly known as: FLOVENT HFA Inhale 2 puffs into the lungs in the morning and at bedtime.   hydroxychloroquine 200 MG tablet Commonly known as: Plaquenil Take 2 tablets (400 mg total) by mouth daily.   montelukast 10 MG tablet Commonly known as: SINGULAIR Take 1 tablet (10 mg total) by mouth at bedtime.   omeprazole 40 MG capsule Commonly known as: PRILOSEC Take 1 capsule (40 mg total) by mouth 2 (two) times daily.   Peppermint Oil 90 MG Cpcr   predniSONE 10 MG tablet Commonly  known as: DELTASONE Take 5 daily for 3 days followed by 4,3,2 and 1 for 3 days each.   rOPINIRole 4 MG tablet Commonly known as: REQUIP Take 1 tablet (4 mg total) by mouth at bedtime.   rosuvastatin 10 MG tablet Commonly known as: CRESTOR Take 1 tablet (10 mg total) by mouth daily.   sertraline 100 MG tablet Commonly known as: ZOLOFT Take 1  tablet by mouth daily. For the first week take only 1/2 tabletdaily, Then increase to a full dose.   Spiriva Respimat 1.25 MCG/ACT Aers Generic drug: Tiotropium Bromide Monohydrate Inhale into the lungs.   traMADol 50 MG tablet Commonly known as: ULTRAM Take 1 tablet (50 mg total) by mouth every 6 (six) hours as needed.   traZODone 150 MG tablet Commonly known as: DESYREL TAKE 1/3 (ONE-THIRD) to 1 (ONE) tablet BY MOUTH nightly as needed for sleep.   Trelegy Ellipta 100-62.5-25 MCG/INH Aepb Generic drug: Fluticasone-Umeclidin-Vilant Inhale 1 puff into the lungs daily.   UBRELVY PO Take by mouth.   Vitamin D 50 MCG (2000 UT) Caps Take 5,000 Units by mouth daily.        Follow-up: Return in about 2 weeks (around 03/07/2020), or if symptoms worsen or fail to improve.  Claretta Fraise, M.D.

## 2020-02-23 ENCOUNTER — Telehealth: Payer: Self-pay

## 2020-02-23 LAB — CBC WITH DIFFERENTIAL/PLATELET
Basophils Absolute: 0.1 10*3/uL (ref 0.0–0.2)
Basos: 1 %
EOS (ABSOLUTE): 0 10*3/uL (ref 0.0–0.4)
Eos: 0 %
Hematocrit: 41.7 % (ref 34.0–46.6)
Hemoglobin: 13.7 g/dL (ref 11.1–15.9)
Immature Grans (Abs): 0.1 10*3/uL (ref 0.0–0.1)
Immature Granulocytes: 1 %
Lymphocytes Absolute: 1.9 10*3/uL (ref 0.7–3.1)
Lymphs: 24 %
MCH: 29.7 pg (ref 26.6–33.0)
MCHC: 32.9 g/dL (ref 31.5–35.7)
MCV: 90 fL (ref 79–97)
Monocytes Absolute: 0.7 10*3/uL (ref 0.1–0.9)
Monocytes: 9 %
Neutrophils Absolute: 5.3 10*3/uL (ref 1.4–7.0)
Neutrophils: 65 %
Platelets: 312 10*3/uL (ref 150–450)
RBC: 4.62 x10E6/uL (ref 3.77–5.28)
RDW: 12.6 % (ref 11.7–15.4)
WBC: 8 10*3/uL (ref 3.4–10.8)

## 2020-02-23 LAB — BMP8+EGFR
BUN/Creatinine Ratio: 14 (ref 9–23)
BUN: 11 mg/dL (ref 6–24)
CO2: 21 mmol/L (ref 20–29)
Calcium: 9.3 mg/dL (ref 8.7–10.2)
Chloride: 108 mmol/L — ABNORMAL HIGH (ref 96–106)
Creatinine, Ser: 0.8 mg/dL (ref 0.57–1.00)
GFR calc Af Amer: 93 mL/min/{1.73_m2} (ref >59)
GFR calc non Af Amer: 81 mL/min/{1.73_m2} (ref >59)
Glucose: 89 mg/dL (ref 65–99)
Potassium: 4.1 mmol/L (ref 3.5–5.2)
Sodium: 144 mmol/L (ref 134–144)

## 2020-02-23 NOTE — Telephone Encounter (Signed)
Order for IV fluids faxed to [facility]- infusion [phone] and patient aware that she is to be at infusion center at 1:30pm on Friday 02/24/19

## 2020-02-23 NOTE — Telephone Encounter (Signed)
Dr Darlyn Read told a pt yesterday to go get [facility] [place] and they would not do it. Where else can pt go to get iv fluids? I told daughter that nurse lead says to go to ER but daughter says there has been a death in the family and does not want to have to wait there. Daughter was to know if Dr Darlyn Read can but in an order or referral to [facility] infusion center--[phone] option 2. Please call back when done or if cannot be done.

## 2020-02-24 DIAGNOSIS — E860 Dehydration: Secondary | ICD-10-CM | POA: Diagnosis not present

## 2020-02-25 ENCOUNTER — Other Ambulatory Visit: Payer: Self-pay | Admitting: Family Medicine

## 2020-02-25 ENCOUNTER — Encounter: Payer: Self-pay | Admitting: Family Medicine

## 2020-02-25 DIAGNOSIS — G2581 Restless legs syndrome: Secondary | ICD-10-CM

## 2020-02-25 DIAGNOSIS — M797 Fibromyalgia: Secondary | ICD-10-CM

## 2020-02-25 NOTE — Progress Notes (Signed)
Hello Amy Gray,  Your lab result is normal and/or stable.Some minor variations that are not significant are commonly marked abnormal, but do not represent any medical problem for you.  Best regards, Demetrios Byron, M.D.

## 2020-03-05 ENCOUNTER — Other Ambulatory Visit: Payer: Self-pay | Admitting: Family Medicine

## 2020-03-05 DIAGNOSIS — J45909 Unspecified asthma, uncomplicated: Secondary | ICD-10-CM | POA: Diagnosis not present

## 2020-03-05 DIAGNOSIS — U071 COVID-19: Secondary | ICD-10-CM | POA: Diagnosis not present

## 2020-03-05 DIAGNOSIS — N951 Menopausal and female climacteric states: Secondary | ICD-10-CM

## 2020-03-05 DIAGNOSIS — R062 Wheezing: Secondary | ICD-10-CM | POA: Diagnosis not present

## 2020-03-05 DIAGNOSIS — F411 Generalized anxiety disorder: Secondary | ICD-10-CM

## 2020-03-05 DIAGNOSIS — J1282 Pneumonia due to coronavirus disease 2019: Secondary | ICD-10-CM | POA: Diagnosis not present

## 2020-03-06 ENCOUNTER — Other Ambulatory Visit: Payer: Self-pay | Admitting: Family Medicine

## 2020-03-06 DIAGNOSIS — E538 Deficiency of other specified B group vitamins: Secondary | ICD-10-CM

## 2020-03-08 DIAGNOSIS — J4550 Severe persistent asthma, uncomplicated: Secondary | ICD-10-CM | POA: Diagnosis not present

## 2020-03-15 ENCOUNTER — Other Ambulatory Visit: Payer: Self-pay

## 2020-03-15 ENCOUNTER — Ambulatory Visit (INDEPENDENT_AMBULATORY_CARE_PROVIDER_SITE_OTHER): Payer: Medicare Other

## 2020-03-15 ENCOUNTER — Ambulatory Visit (INDEPENDENT_AMBULATORY_CARE_PROVIDER_SITE_OTHER): Payer: Medicare Other | Admitting: Family Medicine

## 2020-03-15 ENCOUNTER — Encounter: Payer: Self-pay | Admitting: Family Medicine

## 2020-03-15 VITALS — BP 102/70 | HR 90 | Temp 96.6°F | Resp 20 | Ht 62.0 in | Wt 172.1 lb

## 2020-03-15 DIAGNOSIS — R059 Cough, unspecified: Secondary | ICD-10-CM | POA: Diagnosis not present

## 2020-03-15 DIAGNOSIS — J4520 Mild intermittent asthma, uncomplicated: Secondary | ICD-10-CM

## 2020-03-15 DIAGNOSIS — J1282 Pneumonia due to coronavirus disease 2019: Secondary | ICD-10-CM

## 2020-03-15 DIAGNOSIS — U071 COVID-19: Secondary | ICD-10-CM

## 2020-03-15 DIAGNOSIS — R0600 Dyspnea, unspecified: Secondary | ICD-10-CM | POA: Diagnosis not present

## 2020-03-15 NOTE — Progress Notes (Signed)
Subjective:  Patient ID: Amy Gray, female    DOB: 1960/12/18  Age: 60 y.o. MRN: [id number]  CC: Two week follow up   HPI Amy Gray presents for recheck of her Covid pneumonia.  She had to go for IV hydration after she was here last time.  That was arranged for her in [place].  Few days later she went to her pulmonologist and her spirometry reports that she had mild restriction.  She has been slowly getting better since that time.  Her appetite is returning she still feels easily winded.  However she is not dyspneic at rest.  Depression screen [facility] 2/9 03/15/2020 01/26/2020 10/25/2019  Decreased Interest 0 0 0  Down, Depressed, Hopeless 0 0 0  PHQ - 2 Score 0 0 0  Altered sleeping - - -  Tired, decreased energy - - -  Change in appetite - - -  Feeling bad or failure about yourself  - - -  Trouble concentrating - - -  Moving slowly or fidgety/restless - - -  Suicidal thoughts - - -  PHQ-9 Score - - -    History Amy Gray has a past medical history of Anxiety, Asthma, Depression, GERD (gastroesophageal reflux disease), Hyperlipidemia, Migraines, and Osteoarthritis.   She has a past surgical history that includes Cervical fusion (2015); Rhinoplasty; and Abdominal hysterectomy.   Her family history includes Arthritis in her father; Asthma in her mother; Heart disease in her father and mother; Hypertension in her sister.She reports that she has never smoked. She has never used smokeless tobacco. She reports that she does not drink alcohol and does not use drugs.    ROS Review of Systems  Constitutional: Positive for activity change, appetite change and fatigue. Negative for diaphoresis and fever.  HENT: Negative for congestion, ear pain, hearing loss, postnasal drip, rhinorrhea, sore throat and trouble swallowing.   Respiratory: Positive for shortness of breath (improving). Negative for cough and chest tightness.   Cardiovascular: Negative for chest pain and  palpitations.  Gastrointestinal: Negative for abdominal pain.  Musculoskeletal: Negative for arthralgias.  Skin: Negative for rash.  Neurological: Positive for weakness (MILD, GENERALIZED).    Objective:  BP 102/70   Pulse 90   Temp (!) 96.6 F (35.9 C) (Temporal)   Resp 20   Ht 5\' 2"  (1.575 m)   Wt 172 lb 2 oz (78.1 kg)   SpO2 96%   BMI 31.48 kg/m   BP Readings from Last 3 Encounters:  03/15/20 102/70  02/22/20 114/70  02/10/20 114/78    Wt Readings from Last 3 Encounters:  03/15/20 172 lb 2 oz (78.1 kg)  01/26/20 171 lb 9.6 oz (77.8 kg)  10/25/19 167 lb (75.8 kg)     Physical Exam Constitutional:      General: She is not in acute distress.    Appearance: She is well-developed and well-nourished.  Cardiovascular:     Rate and Rhythm: Normal rate and regular rhythm.  Pulmonary:     Breath sounds: Rhonchi (few, mild, scattered) present.  Skin:    General: Skin is warm and dry.  Neurological:     Mental Status: She is alert and oriented to person, place, and time.  Psychiatric:        Mood and Affect: Mood and affect normal.       Assessment & Plan:   Amy Gray was seen today for two week follow up.  Diagnoses and all orders for this visit:  Pneumonia due to COVID-19 virus -  DG Chest 2 View; Future  Intermittent asthma, unspecified asthma severity, unspecified whether complicated       I have discontinued Nyiesha Juran's Peppermint Oil, baclofen, fexofenadine, Trelegy Ellipta, predniSONE, hydroxychloroquine, dexamethasone, azithromycin, and Butalbital-Acetaminophen. I am also having her maintain her Vitamin D, aspirin, acetaZOLAMIDE, Align, Botox, traMADol, butalbital-acetaminophen-caffeine, Ubrogepant (UBRELVY PO), montelukast, omeprazole, rosuvastatin, Spiriva Respimat, traZODone, budesonide, fluticasone, albuterol, albuterol, rOPINIRole, estradiol, sertraline, cyanocobalamin, budesonide-formoterol, cetirizine, promethazine, and  prochlorperazine. We will continue to administer cyanocobalamin.  Allergies as of 03/15/2020      Reactions   Pregabalin Shortness Of Breath   Chlorzoxazone Other (See Comments)   Decrease in BP    Diclofenac Other (See Comments)   Other reaction(s): Other Other reaction(s): Other Update Update Update Update Other reaction(s): Other Other reaction(s): Other Update Update Update Update   Metoclopramide Other (See Comments)   Restless legs   Sibutramine    Other reaction(s): Other update   Topiramate Other (See Comments)   Venlafaxine Other (See Comments)   Restless legs   Amitriptyline    Duloxetine Hcl    Guaifenesin Er    Oxybutynin    Gabapentin    Other reaction(s): Confusion   Propranolol Anxiety      Medication List       Accurate as of March 15, 2020  1:53 PM. If you have any questions, ask your nurse or doctor.        STOP taking these medications   azithromycin 250 MG tablet Commonly known as: Zithromax Z-Pak Stopped by: Mechele Claude, MD   baclofen 10 MG tablet Commonly known as: LIORESAL Stopped by: Mechele Claude, MD   Butalbital-Acetaminophen 25-325 MG Tabs Stopped by: Mechele Claude, MD   dexamethasone 6 MG tablet Commonly known as: DECADRON Stopped by: Mechele Claude, MD   fexofenadine 180 MG tablet Commonly known as: ALLEGRA Stopped by: Mechele Claude, MD   hydroxychloroquine 200 MG tablet Commonly known as: Plaquenil Stopped by: Mechele Claude, MD   Peppermint Oil 90 MG Cpcr Stopped by: Mechele Claude, MD   predniSONE 10 MG tablet Commonly known as: DELTASONE Stopped by: Mechele Claude, MD   Trelegy Salomon Mast 100-62.5-25 MCG/INH Aepb Generic drug: Fluticasone-Umeclidin-Vilant Stopped by: Mechele Claude, MD     TAKE these medications   acetaZOLAMIDE 250 MG tablet Commonly known as: DIAMOX Take 250 mg by mouth 2 (two) times daily.   albuterol 108 (90 Base) MCG/ACT inhaler Commonly known as: VENTOLIN HFA Inhale 2 puffs into  the lungs every 6 (six) hours as needed for wheezing or shortness of breath.   albuterol (2.5 MG/3ML) 0.083% nebulizer solution Commonly known as: PROVENTIL Take 3 mLs (2.5 mg total) by nebulization every 4 (four) hours as needed for wheezing or shortness of breath.   Align 4 MG Caps   aspirin 325 MG EC tablet Take 1 tablet by mouth daily.   Botox 200 units Solr Generic drug: Botulinum Toxin Type A   budesonide 0.25 MG/2ML nebulizer solution Commonly known as: PULMICORT Take 2 mLs (0.25 mg total) by nebulization 2 (two) times daily as needed (shortness of breath).   budesonide-formoterol 160-4.5 MCG/ACT inhaler Commonly known as: SYMBICORT Inhale 2 puffs into the lungs 2 (two) times daily.   butalbital-acetaminophen-caffeine 50-325-40 MG tablet Commonly known as: FIORICET   cetirizine 10 MG tablet Commonly known as: ZYRTEC Take 10 mg by mouth daily.   cyanocobalamin 1000 MCG/ML injection Commonly known as: (VITAMIN B-12) Inject 1,000 mcg as directed every 30 (thirty) days.   estradiol 0.5 MG tablet Commonly known  as: ESTRACE TAKE 1 TABLET BY MOUTH ONCE DAILY   fluticasone 220 MCG/ACT inhaler Commonly known as: FLOVENT HFA Inhale 2 puffs into the lungs in the morning and at bedtime.   montelukast 10 MG tablet Commonly known as: SINGULAIR Take 1 tablet (10 mg total) by mouth at bedtime.   omeprazole 40 MG capsule Commonly known as: PRILOSEC Take 1 capsule (40 mg total) by mouth 2 (two) times daily.   prochlorperazine 10 MG tablet Commonly known as: COMPAZINE Take 10 mg by mouth every 6 (six) hours as needed for nausea or vomiting.   promethazine 12.5 MG tablet Commonly known as: PHENERGAN Take 12.5 mg by mouth every 8 (eight) hours as needed for nausea or vomiting.   rOPINIRole 4 MG tablet Commonly known as: REQUIP Take 1 tablet (4 mg total) by mouth at bedtime.   rosuvastatin 10 MG tablet Commonly known as: CRESTOR Take 1 tablet (10 mg total) by mouth  daily.   sertraline 100 MG tablet Commonly known as: ZOLOFT Take 1 tablet by mouth daily. For the first week take only ONE-HALF tablet daily, Then increase to a full dose.   Spiriva Respimat 1.25 MCG/ACT Aers Generic drug: Tiotropium Bromide Monohydrate Inhale into the lungs.   traMADol 50 MG tablet Commonly known as: ULTRAM Take 1 tablet (50 mg total) by mouth every 6 (six) hours as needed.   traZODone 150 MG tablet Commonly known as: DESYREL TAKE 1/3 (ONE-THIRD) to 1 (ONE) tablet BY MOUTH nightly as needed for sleep.   UBRELVY PO Take by mouth.   Vitamin D 50 MCG (2000 UT) Caps Take 5,000 Units by mouth daily.        Follow-up: Return in about 3 months (around 06/12/2020), or if symptoms worsen or fail to improve.  Mechele Claude, M.D.

## 2020-03-20 DIAGNOSIS — M79671 Pain in right foot: Secondary | ICD-10-CM | POA: Diagnosis not present

## 2020-03-27 DIAGNOSIS — G43709 Chronic migraine without aura, not intractable, without status migrainosus: Secondary | ICD-10-CM | POA: Diagnosis not present

## 2020-04-04 DIAGNOSIS — M79671 Pain in right foot: Secondary | ICD-10-CM | POA: Diagnosis not present

## 2020-04-17 ENCOUNTER — Other Ambulatory Visit: Payer: Self-pay | Admitting: Family Medicine

## 2020-04-17 DIAGNOSIS — M797 Fibromyalgia: Secondary | ICD-10-CM

## 2020-04-17 DIAGNOSIS — F5105 Insomnia due to other mental disorder: Secondary | ICD-10-CM

## 2020-04-24 DIAGNOSIS — R0600 Dyspnea, unspecified: Secondary | ICD-10-CM | POA: Diagnosis not present

## 2020-04-24 DIAGNOSIS — J310 Chronic rhinitis: Secondary | ICD-10-CM | POA: Diagnosis not present

## 2020-04-24 DIAGNOSIS — J8283 Eosinophilic asthma: Secondary | ICD-10-CM | POA: Diagnosis not present

## 2020-04-24 DIAGNOSIS — J4550 Severe persistent asthma, uncomplicated: Secondary | ICD-10-CM | POA: Diagnosis not present

## 2020-04-25 ENCOUNTER — Ambulatory Visit: Payer: Medicare Other | Admitting: Family Medicine

## 2020-05-15 DIAGNOSIS — J310 Chronic rhinitis: Secondary | ICD-10-CM | POA: Diagnosis not present

## 2020-05-15 DIAGNOSIS — K219 Gastro-esophageal reflux disease without esophagitis: Secondary | ICD-10-CM | POA: Diagnosis not present

## 2020-05-15 DIAGNOSIS — J4550 Severe persistent asthma, uncomplicated: Secondary | ICD-10-CM | POA: Diagnosis not present

## 2020-05-18 ENCOUNTER — Other Ambulatory Visit: Payer: Self-pay | Admitting: Family

## 2020-05-19 ENCOUNTER — Other Ambulatory Visit: Payer: Self-pay | Admitting: Family Medicine

## 2020-05-19 DIAGNOSIS — F5105 Insomnia due to other mental disorder: Secondary | ICD-10-CM

## 2020-05-19 DIAGNOSIS — M797 Fibromyalgia: Secondary | ICD-10-CM

## 2020-05-19 DIAGNOSIS — F99 Mental disorder, not otherwise specified: Secondary | ICD-10-CM

## 2020-06-07 ENCOUNTER — Other Ambulatory Visit: Payer: Self-pay | Admitting: Family Medicine

## 2020-06-07 DIAGNOSIS — F411 Generalized anxiety disorder: Secondary | ICD-10-CM

## 2020-06-07 DIAGNOSIS — E782 Mixed hyperlipidemia: Secondary | ICD-10-CM

## 2020-06-07 DIAGNOSIS — J45909 Unspecified asthma, uncomplicated: Secondary | ICD-10-CM

## 2020-06-07 DIAGNOSIS — N951 Menopausal and female climacteric states: Secondary | ICD-10-CM

## 2020-06-12 DIAGNOSIS — J310 Chronic rhinitis: Secondary | ICD-10-CM | POA: Diagnosis not present

## 2020-06-12 DIAGNOSIS — J4550 Severe persistent asthma, uncomplicated: Secondary | ICD-10-CM | POA: Diagnosis not present

## 2020-06-12 DIAGNOSIS — R0600 Dyspnea, unspecified: Secondary | ICD-10-CM | POA: Diagnosis not present

## 2020-06-12 DIAGNOSIS — J8283 Eosinophilic asthma: Secondary | ICD-10-CM | POA: Diagnosis not present

## 2020-06-13 ENCOUNTER — Other Ambulatory Visit: Payer: Self-pay

## 2020-06-13 ENCOUNTER — Encounter: Payer: Self-pay | Admitting: Family Medicine

## 2020-06-13 ENCOUNTER — Ambulatory Visit (INDEPENDENT_AMBULATORY_CARE_PROVIDER_SITE_OTHER): Payer: Medicare Other | Admitting: Family Medicine

## 2020-06-13 VITALS — BP 106/73 | HR 101 | Temp 97.2°F | Ht 62.0 in | Wt 173.0 lb

## 2020-06-13 DIAGNOSIS — J8283 Eosinophilic asthma: Secondary | ICD-10-CM

## 2020-06-13 MED ORDER — BREZTRI AEROSPHERE 160-9-4.8 MCG/ACT IN AERO: 2.0000 | INHALATION_SPRAY | Freq: Two times a day (BID) | RESPIRATORY_TRACT | 11 refills | Status: DC

## 2020-06-13 NOTE — Progress Notes (Signed)
Subjective:  Patient ID: Amy Gray, female    DOB: Mar 23, 1960  Age: 60 y.o. MRN: [id number]  CC: Medical Management of Chronic Issues   HPI Amy Gray presents for has been having asthma. Seeing allergist, Dr. Henderson [place]. Has  chronic cough.Had steroids and antibiotics. Considering a study at [facility] for a new biologics. A CT of chest is ordered. Pending.   Depression screen [facility] 2/9 06/13/2020 06/13/2020 03/15/2020  Decreased Interest 0 0 0  Down, Depressed, Hopeless 0 0 0  PHQ - 2 Score 0 0 0  Altered sleeping 0 - -  Tired, decreased energy 1 - -  Change in appetite 1 - -  Feeling bad or failure about yourself  0 - -  Trouble concentrating 0 - -  Moving slowly or fidgety/restless 0 - -  Suicidal thoughts 0 - -  PHQ-9 Score 2 - -  Difficult doing work/chores Not difficult at all - -    History Amy Gray has a past medical history of Anxiety, Asthma, Depression, GERD (gastroesophageal reflux disease), Hyperlipidemia, Migraines, and Osteoarthritis.   She has a past surgical history that includes Cervical fusion (2015); Rhinoplasty; and Abdominal hysterectomy.   Her family history includes Arthritis in her father; Asthma in her mother; Heart disease in her father and mother; Hypertension in her sister.She reports that she has never smoked. She has never used smokeless tobacco. She reports that she does not drink alcohol and does not use drugs.    ROS Review of Systems  Constitutional: Negative.   HENT: Negative.   Eyes: Negative for visual disturbance.  Respiratory: Negative for shortness of breath.   Cardiovascular: Negative for chest pain.  Gastrointestinal: Negative for abdominal pain.  Musculoskeletal: Negative for arthralgias.    Objective:  BP 106/73   Pulse (!) 101   Temp (!) 97.2 F (36.2 C)   Ht '5\' 2"'  (1.575 m)   Wt 173 lb (78.5 kg)   SpO2 97%   BMI 31.64 kg/m   BP Readings from Last 3 Encounters:  06/13/20 106/73  03/15/20 102/70  02/22/20  114/70    Wt Readings from Last 3 Encounters:  06/13/20 173 lb (78.5 kg)  03/15/20 172 lb 2 oz (78.1 kg)  01/26/20 171 lb 9.6 oz (77.8 kg)     Physical Exam Constitutional:      General: She is not in acute distress.    Appearance: She is well-developed.  Cardiovascular:     Rate and Rhythm: Normal rate and regular rhythm.  Pulmonary:     Breath sounds: Normal breath sounds.  Skin:    General: Skin is warm and dry.  Neurological:     Mental Status: She is alert and oriented to person, place, and time.       Assessment & Plan:   Amy Gray was seen today for medical management of chronic issues.  Diagnoses and all orders for this visit:  Eosinophilic asthma -     IgE -     CBC with Differential/Platelet -     CMP14+EGFR  Other orders -     Budeson-Glycopyrrol-Formoterol (BREZTRI AEROSPHERE) 160-9-4.8 MCG/ACT AERO; Inhale 2 puffs into the lungs in the morning and at bedtime.       I have discontinued Amy Gray's budesonide-formoterol. I am also having her start on [employer]. Additionally, I am having her maintain her Vitamin D, aspirin, acetaZOLAMIDE, Align, Botox, traMADol, butalbital-acetaminophen-caffeine, Ubrogepant (UBRELVY PO), omeprazole, Spiriva Respimat, budesonide, fluticasone, albuterol, rOPINIRole, cyanocobalamin, cetirizine, promethazine, prochlorperazine, albuterol, traZODone, estradiol, montelukast,  rosuvastatin, and sertraline. We will continue to administer cyanocobalamin.  Allergies as of 06/13/2020      Reactions   Pregabalin Shortness Of Breath   Chlorzoxazone Other (See Comments)   Decrease in BP    Diclofenac Other (See Comments)   Other reaction(s): Other Other reaction(s): Other Update Update Update Update Other reaction(s): Other Other reaction(s): Other Update Update Update Update   Metoclopramide Other (See Comments)   Restless legs   Sibutramine    Other reaction(s): Other update   Topiramate Other (See  Comments)   Venlafaxine Other (See Comments)   Restless legs   Amitriptyline    Duloxetine Hcl    Guaifenesin Er    Oxybutynin    Gabapentin    Other reaction(s): Confusion   Propranolol Anxiety      Medication List       Accurate as of Jun 13, 2020 11:08 PM. If you have any questions, ask your nurse or doctor.        STOP taking these medications   budesonide-formoterol 160-4.5 MCG/ACT inhaler Commonly known as: SYMBICORT Stopped by: Claretta Fraise, MD     TAKE these medications   acetaZOLAMIDE 250 MG tablet Commonly known as: DIAMOX Take 250 mg by mouth 2 (two) times daily.   albuterol 108 (90 Base) MCG/ACT inhaler Commonly known as: VENTOLIN HFA Inhale 2 puffs into the lungs every 6 (six) hours as needed for wheezing or shortness of breath.   albuterol (2.5 MG/3ML) 0.083% nebulizer solution Commonly known as: PROVENTIL Take 3 mLs(1 VIAL) (2.5 mg total) by nebulization every 4 (four) hours as needed for wheezing or shortness of breath.   Align 4 MG Caps   aspirin 325 MG EC tablet Take 1 tablet by mouth daily.   Botox 200 units Solr Generic drug: Botulinum Toxin Type A   Breztri Aerosphere 160-9-4.8 MCG/ACT Aero Generic drug: Budeson-Glycopyrrol-Formoterol Inhale 2 puffs into the lungs in the morning and at bedtime. Started by: Claretta Fraise, MD   budesonide 0.25 MG/2ML nebulizer solution Commonly known as: PULMICORT Take 2 mLs (0.25 mg total) by nebulization 2 (two) times daily as needed (shortness of breath).   butalbital-acetaminophen-caffeine 50-325-40 MG tablet Commonly known as: FIORICET   cetirizine 10 MG tablet Commonly known as: ZYRTEC Take 10 mg by mouth daily.   cyanocobalamin 1000 MCG/ML injection Commonly known as: (VITAMIN B-12) Inject 1,000 mcg as directed every 30 (thirty) days.   estradiol 0.5 MG tablet Commonly known as: ESTRACE TAKE 1 TABLET BY MOUTH ONCE DAILY   fluticasone 220 MCG/ACT inhaler Commonly known as: FLOVENT  HFA Inhale 2 puffs into the lungs in the morning and at bedtime.   montelukast 10 MG tablet Commonly known as: SINGULAIR Take 1 tablet (10 mg total) by mouth at bedtime.   omeprazole 40 MG capsule Commonly known as: PRILOSEC Take 1 capsule (40 mg total) by mouth 2 (two) times daily.   prochlorperazine 10 MG tablet Commonly known as: COMPAZINE Take 10 mg by mouth every 6 (six) hours as needed for nausea or vomiting.   promethazine 12.5 MG tablet Commonly known as: PHENERGAN Take 12.5 mg by mouth every 8 (eight) hours as needed for nausea or vomiting.   rOPINIRole 4 MG tablet Commonly known as: REQUIP Take 1 tablet (4 mg total) by mouth at bedtime.   rosuvastatin 10 MG tablet Commonly known as: CRESTOR Take 1 tablet (10 mg total) by mouth daily.   sertraline 100 MG tablet Commonly known as: ZOLOFT Take 1 tablet by mouth  daily. For the first week take only ONE-HALF tablet daily, Then increase to a full dose.   Spiriva Respimat 1.25 MCG/ACT Aers Generic drug: Tiotropium Bromide Monohydrate Inhale into the lungs.   traMADol 50 MG tablet Commonly known as: ULTRAM Take 1 tablet (50 mg total) by mouth every 6 (six) hours as needed.   traZODone 150 MG tablet Commonly known as: DESYREL TAKE 1/3 (ONE-THIRD) to 1 (ONE) tablet BY MOUTH nightly as needed for sleep.   UBRELVY PO Take by mouth.   Vitamin D 50 MCG (2000 UT) Caps Take 5,000 Units by mouth daily.        Follow-up: Return in about 6 months (around 12/14/2020).  Claretta Fraise, M.D.

## 2020-06-19 ENCOUNTER — Encounter: Payer: Self-pay | Admitting: Family Medicine

## 2020-06-19 LAB — CBC WITH DIFFERENTIAL/PLATELET
Basophils Absolute: 0 10*3/uL (ref 0.0–0.2)
Basos: 1 %
EOS (ABSOLUTE): 0 10*3/uL (ref 0.0–0.4)
Eos: 0 %
Hematocrit: 38.9 % (ref 34.0–46.6)
Hemoglobin: 12.7 g/dL (ref 11.1–15.9)
Immature Grans (Abs): 0 10*3/uL (ref 0.0–0.1)
Immature Granulocytes: 0 %
Lymphocytes Absolute: 1.8 10*3/uL (ref 0.7–3.1)
Lymphs: 34 %
MCH: 29.6 pg (ref 26.6–33.0)
MCHC: 32.6 g/dL (ref 31.5–35.7)
MCV: 91 fL (ref 79–97)
Monocytes Absolute: 0.6 10*3/uL (ref 0.1–0.9)
Monocytes: 12 %
Neutrophils Absolute: 2.9 10*3/uL (ref 1.4–7.0)
Neutrophils: 53 %
Platelets: 245 10*3/uL (ref 150–450)
RBC: 4.29 x10E6/uL (ref 3.77–5.28)
RDW: 13.1 % (ref 11.7–15.4)
WBC: 5.3 10*3/uL (ref 3.4–10.8)

## 2020-06-19 LAB — CMP14+EGFR
ALT: 32 IU/L (ref 0–32)
AST: 26 IU/L (ref 0–40)
Albumin/Globulin Ratio: 2.2 (ref 1.2–2.2)
Albumin: 4.2 g/dL (ref 3.8–4.9)
Alkaline Phosphatase: 107 IU/L (ref 44–121)
BUN/Creatinine Ratio: 14 (ref 9–23)
BUN: 13 mg/dL (ref 6–24)
Bilirubin Total: 0.2 mg/dL (ref 0.0–1.2)
CO2: 20 mmol/L (ref 20–29)
Calcium: 9 mg/dL (ref 8.7–10.2)
Chloride: 110 mmol/L — ABNORMAL HIGH (ref 96–106)
Creatinine, Ser: 0.96 mg/dL (ref 0.57–1.00)
Globulin, Total: 1.9 g/dL (ref 1.5–4.5)
Glucose: 88 mg/dL (ref 65–99)
Potassium: 3.6 mmol/L (ref 3.5–5.2)
Sodium: 143 mmol/L (ref 134–144)
Total Protein: 6.1 g/dL (ref 6.0–8.5)
eGFR: 68 mL/min/{1.73_m2} (ref >59)

## 2020-06-19 LAB — IGE: IgE (Immunoglobulin E), Serum: 3 IU/mL — ABNORMAL LOW (ref 6–495)

## 2020-06-21 ENCOUNTER — Other Ambulatory Visit: Payer: Self-pay | Admitting: Family Medicine

## 2020-06-21 DIAGNOSIS — Z1231 Encounter for screening mammogram for malignant neoplasm of breast: Secondary | ICD-10-CM

## 2020-06-26 ENCOUNTER — Ambulatory Visit (INDEPENDENT_AMBULATORY_CARE_PROVIDER_SITE_OTHER): Payer: Medicare Other | Admitting: Family Medicine

## 2020-06-26 ENCOUNTER — Encounter: Payer: Self-pay | Admitting: Family Medicine

## 2020-06-26 VITALS — BP 114/74 | HR 86 | Temp 97.1°F

## 2020-06-26 DIAGNOSIS — J4550 Severe persistent asthma, uncomplicated: Secondary | ICD-10-CM

## 2020-06-26 DIAGNOSIS — R42 Dizziness and giddiness: Secondary | ICD-10-CM

## 2020-06-26 MED ORDER — METHYLPREDNISOLONE ACETATE 40 MG/ML IJ SUSP
80.0000 mg | Freq: Once | INTRAMUSCULAR | Status: AC
  Administered 2020-06-26: 80 mg via INTRAMUSCULAR

## 2020-06-26 MED ORDER — MECLIZINE HCL 12.5 MG PO TABS: 12.5000 mg | ORAL_TABLET | Freq: Three times a day (TID) | ORAL | 0 refills | Status: DC | PRN

## 2020-06-26 NOTE — Progress Notes (Signed)
Acute Office Visit  Subjective:    Patient ID: Amy Gray, female    DOB: 1960-05-18, 60 y.o.   MRN: [id number]  Chief Complaint  Patient presents with  . Cough  . Shortness of Breath    HPI Patient is in today for asthma. She has had shortness of breath, chest tightness, and cough for the last few months. She was treated with doxycyline and steroids in May. She then completed another round of steroids and a zpak. Then then completed a third round of steroids. The steroids help somewhat but she still feels short of breath. She has been off of steroids for a few days and has had increased chest tightness, increased shortness of breath. Symptoms are worse at night. She feels tired. She has been feeling a little dizziness as well. This feels like the room is spinning when she lays down. She denies chest pain that radiates to her left arm, jaw, or shoulder. Denies fever, edema, or focal weakness. She has had a recent negative home Covid test. She has been using her albuterol inhaler 3x a day. Yesterday, she started using her albuterol nebulzier instead of her inhaler. She denies wheezing. Reports her shortness of breath as not being able to get enough air.   She started seeing an allergist in September of last year. She was started on biologics at that time. She was on biologics through February. They didn't seem helpful. They have discussed a trial for new biologics with a study through [facility]. They have ordered a CT scan that will be completed. She had repeat PFTs that showed severe asthma at her last visit. They have completed lab work that showed that her asthma was not due to allergies.   Past Medical History:  Diagnosis Date  . Anxiety   . Asthma   . Depression   . GERD (gastroesophageal reflux disease)   . Hyperlipidemia   . Migraines   . Osteoarthritis     Past Surgical History:  Procedure Laterality Date  . ABDOMINAL HYSTERECTOMY    . CERVICAL FUSION  2015   C4 - C7   . RHINOPLASTY      Family History  Problem Relation Age of Onset  . Asthma Mother   . Heart disease Mother   . Arthritis Father   . Heart disease Father   . Hypertension Sister     Social History   Socioeconomic History  . Marital status: Married    Spouse name: Not on file  . Number of children: Not on file  . Years of education: Not on file  . Highest education level: Not on file  Occupational History  . Not on file  Tobacco Use  . Smoking status: Never Smoker  . Smokeless tobacco: Never Used  Substance and Sexual Activity  . Alcohol use: No    Alcohol/week: 0.0 standard drinks  . Drug use: No  . Sexual activity: Not on file  Other Topics Concern  . Not on file  Social History Narrative  . Not on file   Social Determinants of Health   Financial Resource Strain: Not on file  Food Insecurity: Not on file  Transportation Needs: Not on file  Physical Activity: Not on file  Stress: Not on file  Social Connections: Not on file  Intimate Partner Violence: Not on file    Outpatient Medications Prior to Visit  Medication Sig Dispense Refill  . acetaZOLAMIDE (DIAMOX) 250 MG tablet Take 250 mg by mouth 2 (two)  times daily.     Marland Kitchen albuterol (PROVENTIL) (2.5 MG/3ML) 0.083% nebulizer solution Take 3 mLs(1 VIAL) (2.5 mg total) by nebulization every 4 (four) hours as needed for wheezing or shortness of breath. 75 mL 2  . albuterol (VENTOLIN HFA) 108 (90 Base) MCG/ACT inhaler Inhale 2 puffs into the lungs every 6 (six) hours as needed for wheezing or shortness of breath. 8.5 g 11  . aspirin 325 MG EC tablet Take 1 tablet by mouth daily.    Marland Kitchen BOTOX 200 units SOLR     . Budeson-Glycopyrrol-Formoterol (BREZTRI AEROSPHERE) 160-9-4.8 MCG/ACT AERO Inhale 2 puffs into the lungs in the morning and at bedtime. 10.7 g 11  . butalbital-acetaminophen-caffeine (FIORICET, ESGIC) 50-325-40 MG tablet     . cetirizine (ZYRTEC) 10 MG tablet Take 10 mg by mouth daily.    . Cholecalciferol  (VITAMIN D) 2000 UNITS CAPS Take 5,000 Units by mouth daily.     . cyanocobalamin (,VITAMIN B-12,) 1000 MCG/ML injection Inject 1,000 mcg as directed every 30 (thirty) days. 3 mL 0  . estradiol (ESTRACE) 0.5 MG tablet TAKE 1 TABLET BY MOUTH ONCE DAILY 90 tablet 0  . montelukast (SINGULAIR) 10 MG tablet Take 1 tablet (10 mg total) by mouth at bedtime. 90 tablet 0  . omeprazole (PRILOSEC) 40 MG capsule Take 1 capsule (40 mg total) by mouth 2 (two) times daily. 180 capsule 3  . rOPINIRole (REQUIP) 4 MG tablet Take 1 tablet (4 mg total) by mouth at bedtime. 30 tablet 3  . rosuvastatin (CRESTOR) 10 MG tablet Take 1 tablet (10 mg total) by mouth daily. 90 tablet 0  . sertraline (ZOLOFT) 100 MG tablet Take 1 tablet by mouth daily. For the first week take only ONE-HALF tablet daily, Then increase to a full dose. 90 tablet 0  . traMADol (ULTRAM) 50 MG tablet Take 1 tablet (50 mg total) by mouth every 6 (six) hours as needed. 50 tablet 5  . traZODone (DESYREL) 150 MG tablet TAKE 1/3 (ONE-THIRD) to 1 (ONE) tablet BY MOUTH nightly as needed for sleep. 90 tablet 3  . Ubrogepant (UBRELVY PO) Take by mouth.    . budesonide (PULMICORT) 0.25 MG/2ML nebulizer solution Take 2 mLs (0.25 mg total) by nebulization 2 (two) times daily as needed (shortness of breath). (Patient not taking: Reported on 06/26/2020) 60 mL 12  . prochlorperazine (COMPAZINE) 10 MG tablet Take 10 mg by mouth every 6 (six) hours as needed for nausea or vomiting. (Patient not taking: Reported on 06/26/2020)    . promethazine (PHENERGAN) 12.5 MG tablet Take 12.5 mg by mouth every 8 (eight) hours as needed for nausea or vomiting. (Patient not taking: Reported on 06/26/2020)    . fluticasone (FLOVENT HFA) 220 MCG/ACT inhaler Inhale 2 puffs into the lungs in the morning and at bedtime. 1 each 1  . Probiotic Product (ALIGN) 4 MG CAPS     . Tiotropium Bromide Monohydrate (SPIRIVA RESPIMAT) 1.25 MCG/ACT AERS Inhale into the lungs.     Facility-Administered  Medications Prior to Visit  Medication Dose Route Frequency Provider Last Rate Last Admin  . cyanocobalamin ((VITAMIN B-12)) injection 1,000 mcg  1,000 mcg Intramuscular Q30 days Claretta Fraise, MD   1,000 mcg at 01/04/19 1137    Allergies  Allergen Reactions  . Pregabalin Shortness Of Breath  . Chlorzoxazone Other (See Comments)    Decrease in BP   . Diclofenac Other (See Comments)    Other reaction(s): Other Other reaction(s): Other Update Update Update Update Other reaction(s):  Other Other reaction(s): Other Update Update Update Update   . Metoclopramide Other (See Comments)    Restless legs  . Sibutramine     Other reaction(s): Other update  . Topiramate Other (See Comments)  . Venlafaxine Other (See Comments)    Restless legs  . Amitriptyline   . Duloxetine Hcl   . Guaifenesin Er   . Oxybutynin   . Gabapentin     Other reaction(s): Confusion  . Propranolol Anxiety    Review of Systems As per HPI.     Objective:    Physical Exam Vitals and nursing note reviewed.  Constitutional:      General: She is not in acute distress.    Appearance: She is not ill-appearing or diaphoretic.  HENT:     Head: Normocephalic and atraumatic.  Eyes:     Extraocular Movements: Extraocular movements intact.     Pupils: Pupils are equal, round, and reactive to light.  Neck:     Vascular: No JVD.  Cardiovascular:     Rate and Rhythm: Normal rate and regular rhythm.  No extrasystoles are present.    Heart sounds: Normal heart sounds. No murmur heard. No friction rub. No gallop.   Pulmonary:     Effort: Pulmonary effort is normal. No tachypnea or respiratory distress.     Breath sounds: Normal breath sounds. No decreased breath sounds, wheezing, rhonchi or rales.  Chest:     Chest wall: No tenderness or edema.  Musculoskeletal:     Right lower leg: No edema.     Left lower leg: No edema.  Skin:    General: Skin is warm and dry.  Neurological:     General: No focal  deficit present.     Mental Status: She is alert and oriented to person, place, and time.     Motor: No weakness.  Psychiatric:        Mood and Affect: Mood normal.        Behavior: Behavior normal.     BP 114/74   Pulse 86   Temp (!) 97.1 F (36.2 C) (Oral)   SpO2 96% Comment: room air Wt Readings from Last 3 Encounters:  06/13/20 173 lb (78.5 kg)  03/15/20 172 lb 2 oz (78.1 kg)  01/26/20 171 lb 9.6 oz (77.8 kg)    Health Maintenance Due  Topic Date Due  . COVID-19 Vaccine (1) Never done  . HIV Screening  Never done  . Hepatitis C Screening  Never done  . Zoster Vaccines- Shingrix (1 of 2) Never done  . Pneumococcal Vaccine 40-12 Years old (1 of 2 - PPSV23) Never done  . PAP SMEAR-Modifier  08/20/2016  . MAMMOGRAM  04/04/2020    There are no preventive care reminders to display for this patient.   Lab Results  Component Value Date   TSH 3.720 09/28/2019   Lab Results  Component Value Date   WBC 5.3 06/13/2020   HGB 12.7 06/13/2020   HCT 38.9 06/13/2020   MCV 91 06/13/2020   PLT 245 06/13/2020   Lab Results  Component Value Date   NA 143 06/13/2020   K 3.6 06/13/2020   CO2 20 06/13/2020   GLUCOSE 88 06/13/2020   BUN 13 06/13/2020   CREATININE 0.96 06/13/2020   BILITOT 0.2 06/13/2020   ALKPHOS 107 06/13/2020   AST 26 06/13/2020   ALT 32 06/13/2020   PROT 6.1 06/13/2020   ALBUMIN 4.2 06/13/2020   CALCIUM 9.0 06/13/2020   EGFR 68  06/13/2020   Lab Results  Component Value Date   CHOL 174 01/26/2020   Lab Results  Component Value Date   HDL 59 01/26/2020   Lab Results  Component Value Date   LDLCALC 90 01/26/2020   Lab Results  Component Value Date   TRIG 143 01/26/2020   Lab Results  Component Value Date   CHOLHDL 2.9 01/26/2020   No results found for: HGBA1C     Assessment & Plan:   Madia was seen today for cough and shortness of breath.  Diagnoses and all orders for this visit:  Severe persistent asthma, unspecified whether  complicated Uncontrolled. Sees allergist. Has CT scheduled for tomorrow. Allergist has discussed referral to study with [facility] pending CT results. Currently on breztri and albuterol. Takes singular. Has completed 3 rounds of steroids and 2 rounds of antibiotics. Symptoms seem to improve slightly with steroids then worsen when stopping steroids. Continue current treatment, instructed to notify allergist of lack of improvement in symptoms with current treatment. Steroid IM injection in office today.  -     methylPREDNISolone acetate (DEPO-MEDROL) injection 80 mg  Vertigo Try meclizine as below.  -     meclizine (ANTIVERT) 12.5 MG tablet; Take 1 tablet (12.5 mg total) by mouth 3 (three) times daily as needed for dizziness.  Return to office for new or worsening symptoms, or if symptoms persist.   The patient indicates understanding of these issues and agrees with the plan.   Gwenlyn Perking, FNP

## 2020-06-27 DIAGNOSIS — J45909 Unspecified asthma, uncomplicated: Secondary | ICD-10-CM | POA: Diagnosis not present

## 2020-06-27 DIAGNOSIS — J479 Bronchiectasis, uncomplicated: Secondary | ICD-10-CM | POA: Diagnosis not present

## 2020-06-27 DIAGNOSIS — J8283 Eosinophilic asthma: Secondary | ICD-10-CM | POA: Diagnosis not present

## 2020-06-27 DIAGNOSIS — R490 Dysphonia: Secondary | ICD-10-CM | POA: Diagnosis not present

## 2020-06-27 DIAGNOSIS — J9809 Other diseases of bronchus, not elsewhere classified: Secondary | ICD-10-CM | POA: Diagnosis not present

## 2020-06-27 DIAGNOSIS — J310 Chronic rhinitis: Secondary | ICD-10-CM | POA: Diagnosis not present

## 2020-06-27 DIAGNOSIS — J4550 Severe persistent asthma, uncomplicated: Secondary | ICD-10-CM | POA: Diagnosis not present

## 2020-06-28 ENCOUNTER — Ambulatory Visit
Admission: RE | Admit: 2020-06-28 | Discharge: 2020-06-28 | Disposition: A | Discharge status: Home / Self Care | Payer: Medicare Other | Source: Ambulatory Visit | Attending: Family Medicine | Admitting: Family Medicine

## 2020-06-28 ENCOUNTER — Other Ambulatory Visit: Payer: Self-pay

## 2020-06-28 DIAGNOSIS — Z1231 Encounter for screening mammogram for malignant neoplasm of breast: Secondary | ICD-10-CM

## 2020-07-09 DIAGNOSIS — G43709 Chronic migraine without aura, not intractable, without status migrainosus: Secondary | ICD-10-CM | POA: Diagnosis not present

## 2020-07-12 DIAGNOSIS — J479 Bronchiectasis, uncomplicated: Secondary | ICD-10-CM | POA: Diagnosis not present

## 2020-07-12 DIAGNOSIS — J1282 Pneumonia due to coronavirus disease 2019: Secondary | ICD-10-CM | POA: Diagnosis not present

## 2020-07-12 DIAGNOSIS — J45909 Unspecified asthma, uncomplicated: Secondary | ICD-10-CM | POA: Diagnosis not present

## 2020-07-12 DIAGNOSIS — U099 Post covid-19 condition, unspecified: Secondary | ICD-10-CM | POA: Diagnosis not present

## 2020-07-17 DIAGNOSIS — J310 Chronic rhinitis: Secondary | ICD-10-CM | POA: Diagnosis not present

## 2020-07-17 DIAGNOSIS — J479 Bronchiectasis, uncomplicated: Secondary | ICD-10-CM | POA: Diagnosis not present

## 2020-07-17 DIAGNOSIS — J4550 Severe persistent asthma, uncomplicated: Secondary | ICD-10-CM | POA: Diagnosis not present

## 2020-07-17 DIAGNOSIS — R490 Dysphonia: Secondary | ICD-10-CM | POA: Diagnosis not present

## 2020-07-17 DIAGNOSIS — J45909 Unspecified asthma, uncomplicated: Secondary | ICD-10-CM | POA: Diagnosis not present

## 2020-07-18 NOTE — Addendum Note (Signed)
Encounter addended by: Lorayne Bender on: 07/18/2020 7:40 AM  Actions taken: Imaging Exam ended

## 2020-07-18 NOTE — Addendum Note (Signed)
Encounter addended by: Lorayne Bender on: 07/18/2020 7:40 AM  Actions taken: Imaging Exam begun

## 2020-07-19 ENCOUNTER — Encounter: Payer: Self-pay | Admitting: Family Medicine

## 2020-07-25 ENCOUNTER — Ambulatory Visit (INDEPENDENT_AMBULATORY_CARE_PROVIDER_SITE_OTHER): Payer: Medicare Other

## 2020-07-25 DIAGNOSIS — Z0000 Encounter for general adult medical examination without abnormal findings: Secondary | ICD-10-CM | POA: Diagnosis not present

## 2020-07-25 NOTE — Patient Instructions (Signed)
  Amy Gray , Thank you for taking time to come for your Medicare Wellness Visit. I appreciate your ongoing commitment to your health goals. Please review the following plan we discussed and let me know if I can assist you in the future.   These are the goals we discussed:  Goals      DIET - EAT MORE FRUITS AND VEGETABLES     Exercise 150 min/wk Moderate Activity        This is a list of the screening recommended for you and due dates:  Health Maintenance  Topic Date Due   HIV Screening  Never done   Hepatitis C Screening: USPSTF Recommendation to screen - Ages 32-79 yo.  Never done   Zoster (Shingles) Vaccine (1 of 2) Never done   Pap Smear  08/20/2016   COVID-19 Vaccine (1) 08/10/2020*   Flu Shot  08/20/2020   Mammogram  06/28/2021   Colon Cancer Screening  05/19/2023   Tetanus Vaccine  10/21/2023   Pneumococcal Vaccination  Aged Out   HPV Vaccine  Aged Out  *Topic was postponed. The date shown is not the original due date.

## 2020-07-25 NOTE — Progress Notes (Signed)
MEDICARE ANNUAL WELLNESS VISIT  07/25/2020  Telephone Visit Disclaimer This Medicare AWV was conducted by telephone due to national recommendations for restrictions regarding the COVID-19 Pandemic (e.g. social distancing).  I verified, using two identifiers, that I am speaking with Amy Gray or their authorized healthcare agent. I discussed the limitations, risks, security, and privacy concerns of performing an evaluation and management service by telephone and the potential availability of an in-person appointment in the future. The patient expressed understanding and agreed to proceed.  Location of Patient: Home Location of Provider (nurse):  [facility]  Subjective:    Amy Gray is a 60 y.o. female patient of Stacks, Broadus John, MD who had a Medicare Annual Wellness Visit today via telephone. Amy Gray lives in nearby [place] with her husband. 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 She stays as active as she can. She has 2 children. One that lives close. Her grandchild stays with her some as well. They enjoy their vacation camper at [facility]. She was recently diagnosed with emphysema and COPD.   Patient Care Team: Mechele Claude, MD as PCP - General (Family Medicine) Gunnar Fusi, MD (Internal Medicine)  Advanced Directives 07/25/2020  Does Patient Have a Medical Advance Directive? No  Would patient like information on creating a medical advance directive? No - Patient declined    Hospital Utilization Over the Past 12 Months: # of hospitalizations or ER visits: 0 # of surgeries: 0  Review of Systems    Patient reports that her overall health is  better in some ways but worse with her breathing issues  compared to last year.  History obtained from chart review  Patient Reported Readings (BP, Pulse, CBG, Weight, etc) none  Pain Assessment Pain : No/denies pain     Current Medications & Allergies  (verified) Allergies as of 07/25/2020       Reactions   Pregabalin Shortness Of Breath   Chlorzoxazone Other (See Comments)   Decrease in BP    Diclofenac Other (See Comments)   Other reaction(s): Other Other reaction(s): Other Update Update Update Update Other reaction(s): Other Other reaction(s): Other Update Update Update Update   Metoclopramide Other (See Comments)   Restless legs   Sibutramine    Other reaction(s): Other update   Topiramate Other (See Comments)   Venlafaxine Other (See Comments)   Restless legs   Amitriptyline    Duloxetine Hcl    Guaifenesin Er    Oxybutynin    Gabapentin    Other reaction(s): Confusion   Propranolol Anxiety        Medication List        Accurate as of July 25, 2020  3:05 PM. If you have any questions, ask your nurse or doctor.          acetaZOLAMIDE 250 MG tablet Commonly known as: DIAMOX Take 250 mg by mouth 2 (two) times daily.   albuterol 108 (90 Base) MCG/ACT inhaler Commonly known as: VENTOLIN HFA Inhale 2 puffs into the lungs every 6 (six) hours as needed for wheezing or shortness of breath.   albuterol (2.5 MG/3ML) 0.083% nebulizer solution Commonly known as: PROVENTIL Take 3 mLs(1 VIAL) (2.5 mg total) by nebulization every 4 (four) hours as needed for wheezing or shortness of breath.   aspirin 325 MG EC tablet Take 1 tablet by mouth daily.   Botox 200 units Solr Generic drug: Botulinum Toxin Type A   Breztri Aerosphere  160-9-4.8 MCG/ACT Aero Generic drug: Budeson-Glycopyrrol-Formoterol Inhale 2 puffs into the lungs in the morning and at bedtime.   budesonide 0.25 MG/2ML nebulizer solution Commonly known as: PULMICORT Take 2 mLs (0.25 mg total) by nebulization 2 (two) times daily as needed (shortness of breath).   butalbital-acetaminophen-caffeine 50-325-40 MG tablet Commonly known as: FIORICET   cetirizine 10 MG tablet Commonly known as: ZYRTEC Take 10 mg by mouth daily.   cyanocobalamin  1000 MCG/ML injection Commonly known as: (VITAMIN B-12) Inject 1,000 mcg as directed every 30 (thirty) days.   estradiol 0.5 MG tablet Commonly known as: ESTRACE TAKE 1 TABLET BY MOUTH ONCE DAILY   meclizine 12.5 MG tablet Commonly known as: ANTIVERT Take 1 tablet (12.5 mg total) by mouth 3 (three) times daily as needed for dizziness.   montelukast 10 MG tablet Commonly known as: SINGULAIR Take 1 tablet (10 mg total) by mouth at bedtime.   omeprazole 40 MG capsule Commonly known as: PRILOSEC Take 1 capsule (40 mg total) by mouth 2 (two) times daily.   prochlorperazine 10 MG tablet Commonly known as: COMPAZINE Take 10 mg by mouth every 6 (six) hours as needed for nausea or vomiting.   promethazine 12.5 MG tablet Commonly known as: PHENERGAN Take 12.5 mg by mouth every 8 (eight) hours as needed for nausea or vomiting.   rOPINIRole 4 MG tablet Commonly known as: REQUIP Take 1 tablet (4 mg total) by mouth at bedtime.   rosuvastatin 10 MG tablet Commonly known as: CRESTOR Take 1 tablet (10 mg total) by mouth daily.   sertraline 100 MG tablet Commonly known as: ZOLOFT Take 1 tablet by mouth daily. For the first week take only ONE-HALF tablet daily, Then increase to a full dose.   traMADol 50 MG tablet Commonly known as: ULTRAM Take 1 tablet (50 mg total) by mouth every 6 (six) hours as needed.   traZODone 150 MG tablet Commonly known as: DESYREL TAKE 1/3 (ONE-THIRD) to 1 (ONE) tablet BY MOUTH nightly as needed for sleep.   UBRELVY PO Take by mouth.   Vitamin D 50 MCG (2000 UT) Caps Take 5,000 Units by mouth daily.        History (reviewed): Past Medical History:  Diagnosis Date   Anxiety    Asthma    COPD (chronic obstructive pulmonary disease) (HCC)    Depression    Emphysema of lung (HCC)    GERD (gastroesophageal reflux disease)    Hyperlipidemia    Migraines    Osteoarthritis    Past Surgical History:  Procedure Laterality Date   ABDOMINAL  HYSTERECTOMY     CERVICAL FUSION  2015   C4 - C7   RHINOPLASTY     Family History  Problem Relation Age of Onset   Arthritis Mother    Asthma Mother    Heart disease Mother    Arthritis Father    Heart disease Father    Heart attack Father    Hypertension Sister    Social History   Socioeconomic History   Marital status: Married    Spouse name: Not on file   Number of children: 2   Years of education: Not on file   Highest education level: 12th grade  Occupational History   Occupation: Disabled  Tobacco Use   Smoking status: Never   Smokeless tobacco: Never  Substance and Sexual Activity   Alcohol use: No    Alcohol/week: 0.0 standard drinks   Drug use: No   Sexual activity: Not on  file  Other Topics Concern   Not on file  Social History Narrative   Not on file   Social Determinants of Health   Financial Resource Strain: Not on file  Food Insecurity: Not on file  Transportation Needs: Not on file  Physical Activity: Not on file  Stress: Not on file  Social Connections: Not on file    Activities of Daily Living In your present state of health, do you have any difficulty performing the following activities: 07/25/2020  Hearing? N  Vision? N  Difficulty concentrating or making decisions? N  Walking or climbing stairs? N  Dressing or bathing? N  Doing errands, shopping? N  Preparing Food and eating ? N  Using the Toilet? N  In the past six months, have you accidently leaked urine? N  Do you have problems with loss of bowel control? N  Managing your Medications? N  Managing your Finances? N  Housekeeping or managing your Housekeeping? N  Some recent data might be hidden    Patient Education/ Literacy How often do you need to have someone help you when you read instructions, pamphlets, or other written materials from your doctor or pharmacy?: 1 - Never What is the last grade level you completed in school?: 12th grade  Exercise Current Exercise Habits:  The patient does not participate in regular exercise at present, Exercise limited by: respiratory conditions(s)  Diet Patient reports consuming 3 meals a day and 2 snack(s) a day Patient reports that her primary diet is: Regular Patient reports that she does have regular access to food.   Depression Screen PHQ 2/9 Scores 07/25/2020 06/13/2020 06/13/2020 03/15/2020 01/26/2020 10/25/2019 09/28/2019  PHQ - 2 Score 0 0 0 0 0 0 0  PHQ- 9 Score - 2 - - - - -     Fall Risk Fall Risk  07/25/2020 06/13/2020 01/26/2020 09/28/2019 12/29/2018  Falls in the past year? 0 0 0 0 0  Number falls in past yr: - - 0 0 -  Injury with Fall? - - 0 0 -  Risk for fall due to : - - No Fall Risks No Fall Risks -  Follow up - - Falls evaluation completed Falls evaluation completed -     Objective:  Amy Flemingshyllis Dhami seemed alert and oriented and she participated appropriately during our telephone visit.  Blood Pressure Weight BMI  BP Readings from Last 3 Encounters:  06/26/20 114/74  06/13/20 106/73  03/15/20 102/70   Wt Readings from Last 3 Encounters:  06/13/20 173 lb (78.5 kg)  03/15/20 172 lb 2 oz (78.1 kg)  01/26/20 171 lb 9.6 oz (77.8 kg)   BMI Readings from Last 1 Encounters:  06/13/20 31.64 kg/m    *Unable to obtain current vital signs, weight, and BMI due to telephone visit type  Hearing/Vision  Lakresha did not seem to have difficulty with hearing/understanding during the telephone conversation Reports that she has had a formal eye exam by an eye care professional within the past year Reports that she has not had a formal hearing evaluation within the past year *Unable to fully assess hearing and vision during telephone visit type  Cognitive Function: 6CIT Screen 07/25/2020  What Year? 0 points  What month? 0 points  What time? 0 points  Count back from 20 0 points  Months in reverse 0 points  Repeat phrase 0 points  Total Score 0   (Normal:0-7, Significant for Dysfunction: >8)  Normal Cognitive  Function Screening: Yes   Immunization &  Health Maintenance Record Immunization History  Administered Date(s) Administered   Influenza,inj,Quad PF,6+ Mos 12/21/2014, 12/24/2015, 10/23/2016, 01/15/2018, 12/07/2018   Influenza-Unspecified 12/21/2014   Pneumococcal Conjugate-13 12/21/2014   Td 01/21/2006    Health Maintenance  Topic Date Due   HIV Screening  Never done   Hepatitis C Screening  Never done   Zoster Vaccines- Shingrix (1 of 2) Never done   PAP SMEAR-Modifier  08/20/2016   COVID-19 Vaccine (1) 08/10/2020 (Originally 07/08/1965)   INFLUENZA VACCINE  08/20/2020   MAMMOGRAM  06/28/2021   COLONOSCOPY (Pts 45-54yrs Insurance coverage will need to be confirmed)  05/19/2023   TETANUS/TDAP  10/21/2023   Pneumococcal Vaccine 41-31 Years old  Aged Out   HPV VACCINES  Aged Out       Assessment  This is a routine wellness examination for [employer].  Health Maintenance: Due or Overdue Health Maintenance Due  Topic Date Due   HIV Screening  Never done   Hepatitis C Screening  Never done   Zoster Vaccines- Shingrix (1 of 2) Never done   PAP SMEAR-Modifier  08/20/2016    Amy Gray does not need a referral for Community Assistance: Care Management:   no Social Work:    no Prescription Assistance:  no Nutrition/Diabetes Education:  no   Plan:  Personalized Goals  Goals Addressed             This Visit's Progress    DIET - EAT MORE FRUITS AND VEGETABLES       Exercise 150 min/wk Moderate Activity         Personalized Health Maintenance & Screening Recommendations  Screening Pap smear and pelvic exam  Hepatitis C Screening  Lung Cancer Screening Recommended: no (Low Dose CT Chest recommended if Age 23-80 years, 30 pack-year currently smoking OR have quit w/in past 15 years) Hepatitis C Screening recommended: yes HIV Screening recommended: yes  Advanced Directives: Written information was not prepared per patient's request.  Referrals &  Orders No orders of the defined types were placed in this encounter.   Follow-up Plan Follow-up with Mechele Claude, MD as planned Schedule for a regular follow up. Needs pelvic exam Hep C screening and HIV screening.     I have personally reviewed and noted the following in the patient's chart:   Medical and social history Use of alcohol, tobacco or illicit drugs  Current medications and supplements Functional ability and status Nutritional status Physical activity Advanced directives List of other physicians Hospitalizations, surgeries, and ER visits in previous 12 months Vitals Screenings to include cognitive, depression, and falls Referrals and appointments  In addition, I have reviewed and discussed with Amy Gray certain preventive protocols, quality metrics, and best practice recommendations. A written personalized care plan for preventive services as well as general preventive health recommendations is available and can be mailed to the patient at her request.      Cleda Daub LPN 06/23/4032

## 2020-08-16 DIAGNOSIS — J1282 Pneumonia due to coronavirus disease 2019: Secondary | ICD-10-CM | POA: Diagnosis not present

## 2020-08-16 DIAGNOSIS — J479 Bronchiectasis, uncomplicated: Secondary | ICD-10-CM | POA: Diagnosis not present

## 2020-08-16 DIAGNOSIS — R059 Cough, unspecified: Secondary | ICD-10-CM | POA: Diagnosis not present

## 2020-08-16 DIAGNOSIS — J45909 Unspecified asthma, uncomplicated: Secondary | ICD-10-CM | POA: Diagnosis not present

## 2020-08-18 ENCOUNTER — Other Ambulatory Visit: Payer: Self-pay | Admitting: Family Medicine

## 2020-08-18 DIAGNOSIS — F5105 Insomnia due to other mental disorder: Secondary | ICD-10-CM

## 2020-08-18 DIAGNOSIS — M797 Fibromyalgia: Secondary | ICD-10-CM

## 2020-08-29 DIAGNOSIS — R490 Dysphonia: Secondary | ICD-10-CM | POA: Diagnosis not present

## 2020-08-29 DIAGNOSIS — R053 Chronic cough: Secondary | ICD-10-CM | POA: Diagnosis not present

## 2020-09-03 ENCOUNTER — Other Ambulatory Visit: Payer: Self-pay | Admitting: Family Medicine

## 2020-09-03 DIAGNOSIS — F411 Generalized anxiety disorder: Secondary | ICD-10-CM

## 2020-09-03 DIAGNOSIS — E782 Mixed hyperlipidemia: Secondary | ICD-10-CM

## 2020-09-04 ENCOUNTER — Other Ambulatory Visit: Payer: Self-pay | Admitting: Family Medicine

## 2020-09-04 DIAGNOSIS — M797 Fibromyalgia: Secondary | ICD-10-CM

## 2020-09-04 DIAGNOSIS — G2581 Restless legs syndrome: Secondary | ICD-10-CM

## 2020-09-04 DIAGNOSIS — N951 Menopausal and female climacteric states: Secondary | ICD-10-CM

## 2020-09-27 DIAGNOSIS — J45909 Unspecified asthma, uncomplicated: Secondary | ICD-10-CM | POA: Diagnosis not present

## 2020-09-27 DIAGNOSIS — J4550 Severe persistent asthma, uncomplicated: Secondary | ICD-10-CM | POA: Diagnosis not present

## 2020-09-27 DIAGNOSIS — R0602 Shortness of breath: Secondary | ICD-10-CM | POA: Diagnosis not present

## 2020-10-04 DIAGNOSIS — I340 Nonrheumatic mitral (valve) insufficiency: Secondary | ICD-10-CM | POA: Diagnosis not present

## 2020-10-09 ENCOUNTER — Encounter: Payer: Self-pay | Admitting: Family Medicine

## 2020-10-09 ENCOUNTER — Ambulatory Visit (INDEPENDENT_AMBULATORY_CARE_PROVIDER_SITE_OTHER): Payer: Medicare Other | Admitting: Family Medicine

## 2020-10-09 ENCOUNTER — Other Ambulatory Visit: Payer: Self-pay

## 2020-10-09 VITALS — BP 105/71 | HR 72 | Temp 97.8°F | Ht 62.0 in | Wt 170.2 lb

## 2020-10-09 DIAGNOSIS — G932 Benign intracranial hypertension: Secondary | ICD-10-CM | POA: Diagnosis not present

## 2020-10-09 DIAGNOSIS — Z0000 Encounter for general adult medical examination without abnormal findings: Secondary | ICD-10-CM

## 2020-10-09 DIAGNOSIS — E559 Vitamin D deficiency, unspecified: Secondary | ICD-10-CM

## 2020-10-09 DIAGNOSIS — U071 COVID-19: Secondary | ICD-10-CM

## 2020-10-09 DIAGNOSIS — J1282 Pneumonia due to coronavirus disease 2019: Secondary | ICD-10-CM

## 2020-10-09 DIAGNOSIS — J8283 Eosinophilic asthma: Secondary | ICD-10-CM | POA: Diagnosis not present

## 2020-10-09 DIAGNOSIS — Z0001 Encounter for general adult medical examination with abnormal findings: Secondary | ICD-10-CM | POA: Diagnosis not present

## 2020-10-09 DIAGNOSIS — G43709 Chronic migraine without aura, not intractable, without status migrainosus: Secondary | ICD-10-CM | POA: Diagnosis not present

## 2020-10-09 DIAGNOSIS — F411 Generalized anxiety disorder: Secondary | ICD-10-CM

## 2020-10-09 DIAGNOSIS — E782 Mixed hyperlipidemia: Secondary | ICD-10-CM | POA: Diagnosis not present

## 2020-10-09 DIAGNOSIS — F99 Mental disorder, not otherwise specified: Secondary | ICD-10-CM

## 2020-10-09 DIAGNOSIS — M797 Fibromyalgia: Secondary | ICD-10-CM

## 2020-10-09 DIAGNOSIS — F5105 Insomnia due to other mental disorder: Secondary | ICD-10-CM

## 2020-10-09 DIAGNOSIS — J479 Bronchiectasis, uncomplicated: Secondary | ICD-10-CM

## 2020-10-09 DIAGNOSIS — K2100 Gastro-esophageal reflux disease with esophagitis, without bleeding: Secondary | ICD-10-CM

## 2020-10-09 DIAGNOSIS — Z8616 Personal history of COVID-19: Secondary | ICD-10-CM

## 2020-10-09 DIAGNOSIS — G2581 Restless legs syndrome: Secondary | ICD-10-CM

## 2020-10-09 DIAGNOSIS — J45909 Unspecified asthma, uncomplicated: Secondary | ICD-10-CM

## 2020-10-09 LAB — URINALYSIS
Bilirubin, UA: NEGATIVE
Glucose, UA: NEGATIVE
Ketones, UA: NEGATIVE
Leukocytes,UA: NEGATIVE
Nitrite, UA: NEGATIVE
Protein,UA: NEGATIVE
RBC, UA: NEGATIVE
Specific Gravity, UA: 1.015 (ref 1.005–1.030)
Urobilinogen, Ur: 0.2 mg/dL (ref 0.2–1.0)
pH, UA: 7 (ref 5.0–7.5)

## 2020-10-09 MED ORDER — ROPINIROLE HCL 4 MG PO TABS: ORAL_TABLET | ORAL | 3 refills | Status: DC

## 2020-10-09 MED ORDER — OMEPRAZOLE 40 MG PO CPDR: 40.0000 mg | DELAYED_RELEASE_CAPSULE | Freq: Two times a day (BID) | ORAL | 3 refills | Status: DC

## 2020-10-09 MED ORDER — ROSUVASTATIN CALCIUM 10 MG PO TABS: 10.0000 mg | ORAL_TABLET | Freq: Every day | ORAL | 3 refills | Status: DC

## 2020-10-09 MED ORDER — MONTELUKAST SODIUM 10 MG PO TABS: 10.0000 mg | ORAL_TABLET | Freq: Every day | ORAL | 3 refills | Status: DC

## 2020-10-09 MED ORDER — ALBUTEROL SULFATE HFA 108 (90 BASE) MCG/ACT IN AERS: 2.0000 | INHALATION_SPRAY | Freq: Four times a day (QID) | RESPIRATORY_TRACT | 11 refills | Status: DC | PRN

## 2020-10-09 MED ORDER — ALBUTEROL SULFATE (2.5 MG/3ML) 0.083% IN NEBU: INHALATION_SOLUTION | RESPIRATORY_TRACT | 10 refills | Status: DC

## 2020-10-09 MED ORDER — SERTRALINE HCL 100 MG PO TABS: 100.0000 mg | ORAL_TABLET | Freq: Every day | ORAL | 3 refills | Status: DC

## 2020-10-09 MED ORDER — BUDESONIDE 0.25 MG/2ML IN SUSP: 0.2500 mg | Freq: Two times a day (BID) | RESPIRATORY_TRACT | 12 refills | Status: DC | PRN

## 2020-10-09 MED ORDER — TRAZODONE HCL 150 MG PO TABS: ORAL_TABLET | ORAL | 3 refills | Status: DC

## 2020-10-09 NOTE — Progress Notes (Signed)
Subjective:  Patient ID: Amy Gray, female    DOB: Oct 16, 1960  Age: 60 y.o. MRN: [id number]  CC: Annual Exam   HPI Shaylinn Hladik presents for Annual exam. Seeing Pulmonology. Dx is asthma and Bronchiectasis.  Allergist, Dr. Henderson [place] says she doesn't have allergies.     Depression screen [facility] 2/9 10/09/2020 10/09/2020 07/25/2020  Decreased Interest 0 0 0  Down, Depressed, Hopeless 0 0 0  PHQ - 2 Score 0 0 0  Altered sleeping 0 - -  Tired, decreased energy 1 - -  Change in appetite 0 - -  Feeling bad or failure about yourself  0 - -  Trouble concentrating 0 - -  Moving slowly or fidgety/restless 0 - -  Suicidal thoughts 0 - -  PHQ-9 Score 1 - -  Difficult doing work/chores Not difficult at all - -  Some recent data might be hidden    History Grayson has a past medical history of Anxiety, Asthma, COPD (chronic obstructive pulmonary disease) ([place]), Depression, Emphysema of lung ([place]), GERD (gastroesophageal reflux disease), Hyperlipidemia, Migraines, and Osteoarthritis.   She has a past surgical history that includes Cervical fusion (2015); Rhinoplasty; and Abdominal hysterectomy.   Her family history includes Arthritis in her father and mother; Asthma in her mother; Heart attack in her father; Heart disease in her father and mother; Hypertension in her sister.She reports that she has never smoked. She has never used smokeless tobacco. She reports that she does not drink alcohol and does not use drugs.    ROS Review of Systems  Constitutional:  Negative for appetite change, chills, diaphoresis, fatigue, fever and unexpected weight change.  HENT:  Positive for voice change (hoarse). Negative for congestion, ear pain, hearing loss, postnasal drip, rhinorrhea, sneezing, sore throat and trouble swallowing.   Eyes:  Negative for pain.  Respiratory:  Positive for cough (chronic) and shortness of breath. Negative for chest tightness.   Cardiovascular:  Negative for chest pain  and palpitations.  Gastrointestinal:  Negative for abdominal pain, constipation, diarrhea, nausea and vomiting.  Endocrine: Negative for cold intolerance, heat intolerance, polydipsia, polyphagia and polyuria.  Genitourinary:  Negative for dysuria, frequency and menstrual problem.  Musculoskeletal:  Negative for arthralgias and joint swelling.  Skin:  Negative for rash.  Allergic/Immunologic: Negative for environmental allergies.  Neurological:  Negative for dizziness, weakness, numbness and headaches.  Psychiatric/Behavioral:  Negative for agitation and dysphoric mood.    Objective:  BP 105/71   Pulse 72   Temp 97.8 F (36.6 C)   Ht _0  (1.575 m)   Wt 170 lb 3.2 oz (77.2 kg)   SpO2 98%   BMI 31.13 kg/m   BP Readings from Last 3 Encounters:  10/09/20 105/71  06/26/20 114/74  06/13/20 106/73    Wt Readings from Last 3 Encounters:  10/09/20 170 lb 3.2 oz (77.2 kg)  06/13/20 173 lb (78.5 kg)  03/15/20 172 lb 2 oz (78.1 kg)     Physical Exam Constitutional:      General: She is not in acute distress.    Appearance: Normal appearance. She is well-developed.  HENT:     Head: Normocephalic and atraumatic.     Right Ear: External ear normal.     Left Ear: External ear normal.     Nose: Nose normal.  Eyes:     Conjunctiva/sclera: Conjunctivae normal.     Pupils: Pupils are equal, round, and reactive to light.  Neck:     Thyroid: No thyromegaly.  Cardiovascular:  Rate and Rhythm: Normal rate and regular rhythm.     Heart sounds: Normal heart sounds. No murmur heard. Pulmonary:     Effort: Pulmonary effort is normal. No respiratory distress.     Breath sounds: Normal breath sounds. No wheezing or rales.  Chest:  Breasts:    Breasts are symmetrical.     Right: No inverted nipple, mass or tenderness.     Left: No inverted nipple, mass or tenderness.  Abdominal:     General: Bowel sounds are normal. There is no distension or abdominal bruit.     Palpations: Abdomen  is soft. There is no hepatomegaly, splenomegaly or mass.     Tenderness: There is no abdominal tenderness. Negative signs include Murphy's sign and McBurney's sign.  Musculoskeletal:        General: No tenderness. Normal range of motion.     Cervical back: Normal range of motion and neck supple.  Lymphadenopathy:     Cervical: No cervical adenopathy.  Skin:    General: Skin is warm and dry.     Findings: No rash.  Neurological:     Mental Status: She is alert and oriented to person, place, and time.     Deep Tendon Reflexes: Reflexes are normal and symmetric.  Psychiatric:        Behavior: Behavior normal.        Thought Content: Thought content normal.        Judgment: Judgment normal.      Assessment & Plan:   Neil was seen today for annual exam.  Diagnoses and all orders for this visit:  Well adult exam -     CBC with Differential/Platelet -     CMP14+EGFR -     Urinalysis  IIH (idiopathic intracranial hypertension) -     CBC with Differential/Platelet -     CMP14+EGFR  Mixed hyperlipidemia -     CBC with Differential/Platelet -     CMP14+EGFR -     Lipid panel -     rosuvastatin (CRESTOR) 10 MG tablet; Take 1 tablet (10 mg total) by mouth daily.  Eosinophilic asthma -     CBC with Differential/Platelet -     CMP14+EGFR -     Alpha-1-Antitrypsin Deficiency  Bronchiectasis without complication (HCC) -     CBC with Differential/Platelet -     CMP14+EGFR -     Alpha-1-Antitrypsin Deficiency  Vitamin D deficiency -     VITAMIN D 25 Hydroxy (Vit-D Deficiency, Fractures)  Pneumonia due to COVID-19 virus -     albuterol (VENTOLIN HFA) 108 (90 Base) MCG/ACT inhaler; Inhale 2 puffs into the lungs every 6 (six) hours as needed for wheezing or shortness of breath. -     budesonide (PULMICORT) 0.25 MG/2ML nebulizer solution; Take 2 mLs (0.25 mg total) by nebulization 2 (two) times daily as needed (shortness of breath).  COVID-19 virus detected -     albuterol  (VENTOLIN HFA) 108 (90 Base) MCG/ACT inhaler; Inhale 2 puffs into the lungs every 6 (six) hours as needed for wheezing or shortness of breath. -     budesonide (PULMICORT) 0.25 MG/2ML nebulizer solution; Take 2 mLs (0.25 mg total) by nebulization 2 (two) times daily as needed (shortness of breath).  Moderate asthma without complication, unspecified whether persistent -     montelukast (SINGULAIR) 10 MG tablet; Take 1 tablet (10 mg total) by mouth at bedtime.  Gastroesophageal reflux disease with esophagitis without hemorrhage -     omeprazole (PRILOSEC)  40 MG capsule; Take 1 capsule (40 mg total) by mouth 2 (two) times daily.  Fibromyalgia syndrome -     rOPINIRole (REQUIP) 4 MG tablet; Take 1 tablet (4 mg total) by mouth at bedtime. -     traZODone (DESYREL) 150 MG tablet; TAKE 1/3 (ONE-THIRD) to 1 (ONE) tablet BY MOUTH nightly as needed for sleep.  Restless leg syndrome, uncontrolled -     rOPINIRole (REQUIP) 4 MG tablet; Take 1 tablet (4 mg total) by mouth at bedtime.  Generalized anxiety disorder -     sertraline (ZOLOFT) 100 MG tablet; Take 1 tablet (100 mg total) by mouth at bedtime.  Insomnia due to other mental disorder -     traZODone (DESYREL) 150 MG tablet; TAKE 1/3 (ONE-THIRD) to 1 (ONE) tablet BY MOUTH nightly as needed for sleep.  Other orders -     albuterol (PROVENTIL) (2.5 MG/3ML) 0.083% nebulizer solution; Take 3 mLs(1 VIAL) (2.5 mg total) by nebulization every 4 (four) hours as needed for wheezing or shortness of breath.      I have discontinued Rosaura Helton's aspirin and traMADol. I have also changed her sertraline. Additionally, I am having her maintain her Vitamin D, acetaZOLAMIDE, Botox, butalbital-acetaminophen-caffeine, Ubrogepant (UBRELVY PO), cyanocobalamin, cetirizine, promethazine, prochlorperazine, Breztri Aerosphere, meclizine, estradiol, albuterol, albuterol, budesonide, montelukast, omeprazole, rOPINIRole, rosuvastatin, and traZODone. We will continue  to administer cyanocobalamin.  Allergies as of 10/09/2020       Reactions   Pregabalin Shortness Of Breath   Chlorzoxazone Other (See Comments)   Decrease in BP    Diclofenac Other (See Comments)   Other reaction(s): Other Other reaction(s): Other Update Update Update Update Other reaction(s): Other Other reaction(s): Other Update Update Update Update   Metoclopramide Other (See Comments)   Restless legs   Sibutramine    Other reaction(s): Other update   Topiramate Other (See Comments)   Venlafaxine Other (See Comments)   Restless legs   Amitriptyline    Duloxetine Hcl    Guaifenesin Er    Oxybutynin    Gabapentin    Other reaction(s): Confusion   Propranolol Anxiety        Medication List        Accurate as of October 09, 2020 10:13 PM. If you have any questions, ask your nurse or doctor.          STOP taking these medications    aspirin 325 MG EC tablet Stopped by: Claretta Fraise, MD   traMADol 50 MG tablet Commonly known as: ULTRAM Stopped by: Claretta Fraise, MD       TAKE these medications    acetaZOLAMIDE 250 MG tablet Commonly known as: DIAMOX Take 250 mg by mouth 2 (two) times daily.   albuterol 108 (90 Base) MCG/ACT inhaler Commonly known as: VENTOLIN HFA Inhale 2 puffs into the lungs every 6 (six) hours as needed for wheezing or shortness of breath.   albuterol (2.5 MG/3ML) 0.083% nebulizer solution Commonly known as: PROVENTIL Take 3 mLs(1 VIAL) (2.5 mg total) by nebulization every 4 (four) hours as needed for wheezing or shortness of breath.   Botox 200 units Solr Generic drug: Botulinum Toxin Type A   Breztri Aerosphere 160-9-4.8 MCG/ACT Aero Generic drug: Budeson-Glycopyrrol-Formoterol Inhale 2 puffs into the lungs in the morning and at bedtime.   budesonide 0.25 MG/2ML nebulizer solution Commonly known as: PULMICORT Take 2 mLs (0.25 mg total) by nebulization 2 (two) times daily as needed (shortness of breath).    butalbital-acetaminophen-caffeine 50-325-40 MG tablet Commonly known  as: FIORICET   cetirizine 10 MG tablet Commonly known as: ZYRTEC Take 10 mg by mouth daily.   cyanocobalamin 1000 MCG/ML injection Commonly known as: (VITAMIN B-12) Inject 1,000 mcg as directed every 30 (thirty) days.   estradiol 0.5 MG tablet Commonly known as: ESTRACE TAKE 1 TABLET BY MOUTH ONCE DAILY   meclizine 12.5 MG tablet Commonly known as: ANTIVERT Take 1 tablet (12.5 mg total) by mouth 3 (three) times daily as needed for dizziness.   montelukast 10 MG tablet Commonly known as: SINGULAIR Take 1 tablet (10 mg total) by mouth at bedtime.   omeprazole 40 MG capsule Commonly known as: PRILOSEC Take 1 capsule (40 mg total) by mouth 2 (two) times daily.   prochlorperazine 10 MG tablet Commonly known as: COMPAZINE Take 10 mg by mouth every 6 (six) hours as needed for nausea or vomiting.   promethazine 12.5 MG tablet Commonly known as: PHENERGAN Take 12.5 mg by mouth every 8 (eight) hours as needed for nausea or vomiting.   rOPINIRole 4 MG tablet Commonly known as: REQUIP Take 1 tablet (4 mg total) by mouth at bedtime.   rosuvastatin 10 MG tablet Commonly known as: CRESTOR Take 1 tablet (10 mg total) by mouth daily.   sertraline 100 MG tablet Commonly known as: ZOLOFT Take 1 tablet (100 mg total) by mouth at bedtime. What changed: See the new instructions. Changed by: Claretta Fraise, MD   traZODone 150 MG tablet Commonly known as: DESYREL TAKE 1/3 (ONE-THIRD) to 1 (ONE) tablet BY MOUTH nightly as needed for sleep.   UBRELVY PO Take by mouth.   Vitamin D 50 MCG (2000 UT) Caps Take 5,000 Units by mouth daily.         Follow-up: Return in about 6 months (around 04/08/2021).  Claretta Fraise, M.D.

## 2020-10-12 ENCOUNTER — Other Ambulatory Visit: Payer: Self-pay | Admitting: Family Medicine

## 2020-10-12 DIAGNOSIS — E782 Mixed hyperlipidemia: Secondary | ICD-10-CM

## 2020-10-12 MED ORDER — ROSUVASTATIN CALCIUM 20 MG PO TABS: 20.0000 mg | ORAL_TABLET | Freq: Every day | ORAL | 3 refills | Status: DC

## 2020-10-16 ENCOUNTER — Encounter: Payer: Self-pay | Admitting: Family Medicine

## 2020-10-17 DIAGNOSIS — G4733 Obstructive sleep apnea (adult) (pediatric): Secondary | ICD-10-CM | POA: Diagnosis not present

## 2020-10-17 LAB — CMP14+EGFR
ALT: 20 IU/L (ref 0–32)
AST: 21 IU/L (ref 0–40)
Albumin/Globulin Ratio: 2.4 — ABNORMAL HIGH (ref 1.2–2.2)
Albumin: 4.8 g/dL (ref 3.8–4.9)
Alkaline Phosphatase: 144 IU/L — ABNORMAL HIGH (ref 44–121)
BUN/Creatinine Ratio: 16 (ref 12–28)
BUN: 13 mg/dL (ref 8–27)
Bilirubin Total: <0.2 mg/dL (ref 0.0–1.2)
CO2: 18 mmol/L — ABNORMAL LOW (ref 20–29)
Calcium: 9.3 mg/dL (ref 8.7–10.3)
Chloride: 109 mmol/L — ABNORMAL HIGH (ref 96–106)
Creatinine, Ser: 0.83 mg/dL (ref 0.57–1.00)
Globulin, Total: 2 g/dL (ref 1.5–4.5)
Glucose: 97 mg/dL (ref 65–99)
Potassium: 4.1 mmol/L (ref 3.5–5.2)
Sodium: 145 mmol/L — ABNORMAL HIGH (ref 134–144)
Total Protein: 6.8 g/dL (ref 6.0–8.5)
eGFR: 81 mL/min/{1.73_m2} (ref >59)

## 2020-10-17 LAB — LIPID PANEL
Chol/HDL Ratio: 2.6 ratio (ref 0.0–4.4)
Cholesterol, Total: 204 mg/dL — ABNORMAL HIGH (ref 100–199)
HDL: 77 mg/dL (ref >39)
LDL Chol Calc (NIH): 109 mg/dL — ABNORMAL HIGH (ref 0–99)
Triglycerides: 101 mg/dL (ref 0–149)
VLDL Cholesterol Cal: 18 mg/dL (ref 5–40)

## 2020-10-17 LAB — CBC WITH DIFFERENTIAL/PLATELET
Basophils Absolute: 0 10*3/uL (ref 0.0–0.2)
Basos: 1 %
EOS (ABSOLUTE): 0.1 10*3/uL (ref 0.0–0.4)
Eos: 2 %
Hematocrit: 43.7 % (ref 34.0–46.6)
Hemoglobin: 13.8 g/dL (ref 11.1–15.9)
Immature Grans (Abs): 0 10*3/uL (ref 0.0–0.1)
Immature Granulocytes: 1 %
Lymphocytes Absolute: 1.7 10*3/uL (ref 0.7–3.1)
Lymphs: 30 %
MCH: 29.4 pg (ref 26.6–33.0)
MCHC: 31.6 g/dL (ref 31.5–35.7)
MCV: 93 fL (ref 79–97)
Monocytes Absolute: 0.5 10*3/uL (ref 0.1–0.9)
Monocytes: 9 %
Neutrophils Absolute: 3.5 10*3/uL (ref 1.4–7.0)
Neutrophils: 57 %
Platelets: 253 10*3/uL (ref 150–450)
RBC: 4.7 x10E6/uL (ref 3.77–5.28)
RDW: 13.4 % (ref 11.7–15.4)
WBC: 5.9 10*3/uL (ref 3.4–10.8)

## 2020-10-17 LAB — ALPHA-1-ANTITRYPSIN DEFICIENCY

## 2020-10-17 LAB — VITAMIN D 25 HYDROXY (VIT D DEFICIENCY, FRACTURES): Vit D, 25-Hydroxy: 66.6 ng/mL (ref 30.0–100.0)

## 2020-10-19 DIAGNOSIS — J45909 Unspecified asthma, uncomplicated: Secondary | ICD-10-CM | POA: Diagnosis not present

## 2020-10-19 DIAGNOSIS — G4733 Obstructive sleep apnea (adult) (pediatric): Secondary | ICD-10-CM | POA: Diagnosis not present

## 2020-10-19 DIAGNOSIS — J479 Bronchiectasis, uncomplicated: Secondary | ICD-10-CM | POA: Diagnosis not present

## 2020-10-24 ENCOUNTER — Other Ambulatory Visit: Payer: Self-pay | Admitting: Family Medicine

## 2020-10-24 DIAGNOSIS — E538 Deficiency of other specified B group vitamins: Secondary | ICD-10-CM

## 2020-10-30 DIAGNOSIS — J398 Other specified diseases of upper respiratory tract: Secondary | ICD-10-CM | POA: Diagnosis not present

## 2020-10-30 DIAGNOSIS — G4733 Obstructive sleep apnea (adult) (pediatric): Secondary | ICD-10-CM | POA: Diagnosis not present

## 2020-10-30 DIAGNOSIS — Z886 Allergy status to analgesic agent status: Secondary | ICD-10-CM | POA: Diagnosis not present

## 2020-10-30 DIAGNOSIS — R053 Chronic cough: Secondary | ICD-10-CM | POA: Diagnosis not present

## 2020-10-30 DIAGNOSIS — J4520 Mild intermittent asthma, uncomplicated: Secondary | ICD-10-CM | POA: Diagnosis not present

## 2020-10-30 DIAGNOSIS — Z79899 Other long term (current) drug therapy: Secondary | ICD-10-CM | POA: Diagnosis not present

## 2020-10-30 DIAGNOSIS — Z7951 Long term (current) use of inhaled steroids: Secondary | ICD-10-CM | POA: Diagnosis not present

## 2020-10-30 DIAGNOSIS — J479 Bronchiectasis, uncomplicated: Secondary | ICD-10-CM | POA: Diagnosis not present

## 2020-10-30 DIAGNOSIS — Z7952 Long term (current) use of systemic steroids: Secondary | ICD-10-CM | POA: Diagnosis not present

## 2020-10-30 DIAGNOSIS — K219 Gastro-esophageal reflux disease without esophagitis: Secondary | ICD-10-CM | POA: Diagnosis not present

## 2020-10-30 DIAGNOSIS — E785 Hyperlipidemia, unspecified: Secondary | ICD-10-CM | POA: Diagnosis not present

## 2020-10-30 DIAGNOSIS — Z888 Allergy status to other drugs, medicaments and biological substances status: Secondary | ICD-10-CM | POA: Diagnosis not present

## 2020-11-01 DIAGNOSIS — J398 Other specified diseases of upper respiratory tract: Secondary | ICD-10-CM | POA: Diagnosis not present

## 2020-11-01 DIAGNOSIS — J479 Bronchiectasis, uncomplicated: Secondary | ICD-10-CM | POA: Diagnosis not present

## 2020-11-01 DIAGNOSIS — G4730 Sleep apnea, unspecified: Secondary | ICD-10-CM | POA: Diagnosis not present

## 2020-11-01 DIAGNOSIS — J4550 Severe persistent asthma, uncomplicated: Secondary | ICD-10-CM | POA: Diagnosis not present

## 2020-11-18 DIAGNOSIS — G4733 Obstructive sleep apnea (adult) (pediatric): Secondary | ICD-10-CM | POA: Diagnosis not present

## 2020-11-19 ENCOUNTER — Telehealth: Payer: Self-pay | Admitting: Family Medicine

## 2020-11-19 NOTE — Telephone Encounter (Signed)
Appt for Prevnar 20 given to pt

## 2020-11-20 DIAGNOSIS — J398 Other specified diseases of upper respiratory tract: Secondary | ICD-10-CM | POA: Diagnosis not present

## 2020-11-20 DIAGNOSIS — G4733 Obstructive sleep apnea (adult) (pediatric): Secondary | ICD-10-CM | POA: Diagnosis not present

## 2020-11-20 DIAGNOSIS — R059 Cough, unspecified: Secondary | ICD-10-CM | POA: Diagnosis not present

## 2020-11-20 DIAGNOSIS — J45909 Unspecified asthma, uncomplicated: Secondary | ICD-10-CM | POA: Diagnosis not present

## 2020-11-22 ENCOUNTER — Ambulatory Visit: Payer: Medicare Other

## 2020-11-23 DIAGNOSIS — G4733 Obstructive sleep apnea (adult) (pediatric): Secondary | ICD-10-CM | POA: Diagnosis not present

## 2020-11-28 ENCOUNTER — Other Ambulatory Visit: Payer: Self-pay | Admitting: Family Medicine

## 2020-11-28 DIAGNOSIS — N951 Menopausal and female climacteric states: Secondary | ICD-10-CM

## 2020-12-05 ENCOUNTER — Ambulatory Visit (INDEPENDENT_AMBULATORY_CARE_PROVIDER_SITE_OTHER): Payer: Medicare Other | Admitting: *Deleted

## 2020-12-05 ENCOUNTER — Other Ambulatory Visit: Payer: Self-pay

## 2020-12-05 DIAGNOSIS — Z23 Encounter for immunization: Secondary | ICD-10-CM | POA: Diagnosis not present

## 2020-12-05 NOTE — Progress Notes (Signed)
Prevnar 20 and flu shot given

## 2020-12-08 ENCOUNTER — Other Ambulatory Visit: Payer: Self-pay | Admitting: Family Medicine

## 2020-12-23 DIAGNOSIS — G4733 Obstructive sleep apnea (adult) (pediatric): Secondary | ICD-10-CM | POA: Diagnosis not present

## 2020-12-27 DIAGNOSIS — R490 Dysphonia: Secondary | ICD-10-CM | POA: Diagnosis not present

## 2020-12-27 DIAGNOSIS — J3489 Other specified disorders of nose and nasal sinuses: Secondary | ICD-10-CM | POA: Diagnosis not present

## 2021-01-21 ENCOUNTER — Other Ambulatory Visit: Payer: Self-pay | Admitting: Family Medicine

## 2021-01-21 DIAGNOSIS — J398 Other specified diseases of upper respiratory tract: Secondary | ICD-10-CM | POA: Diagnosis not present

## 2021-01-21 DIAGNOSIS — Z1152 Encounter for screening for COVID-19: Secondary | ICD-10-CM | POA: Diagnosis not present

## 2021-01-21 DIAGNOSIS — G4733 Obstructive sleep apnea (adult) (pediatric): Secondary | ICD-10-CM | POA: Diagnosis not present

## 2021-01-21 DIAGNOSIS — J479 Bronchiectasis, uncomplicated: Secondary | ICD-10-CM | POA: Diagnosis not present

## 2021-01-21 DIAGNOSIS — E538 Deficiency of other specified B group vitamins: Secondary | ICD-10-CM

## 2021-01-23 ENCOUNTER — Ambulatory Visit (INDEPENDENT_AMBULATORY_CARE_PROVIDER_SITE_OTHER): Payer: Medicare Other

## 2021-01-23 ENCOUNTER — Ambulatory Visit (INDEPENDENT_AMBULATORY_CARE_PROVIDER_SITE_OTHER): Payer: Medicare Other | Admitting: Family Medicine

## 2021-01-23 ENCOUNTER — Encounter: Payer: Self-pay | Admitting: Family Medicine

## 2021-01-23 VITALS — BP 106/73 | HR 77 | Temp 97.2°F | Ht 62.0 in | Wt 175.2 lb

## 2021-01-23 DIAGNOSIS — J398 Other specified diseases of upper respiratory tract: Secondary | ICD-10-CM | POA: Diagnosis not present

## 2021-01-23 DIAGNOSIS — J4550 Severe persistent asthma, uncomplicated: Secondary | ICD-10-CM | POA: Diagnosis not present

## 2021-01-23 DIAGNOSIS — R200 Anesthesia of skin: Secondary | ICD-10-CM | POA: Diagnosis not present

## 2021-01-23 DIAGNOSIS — M5136 Other intervertebral disc degeneration, lumbar region: Secondary | ICD-10-CM

## 2021-01-23 DIAGNOSIS — G4733 Obstructive sleep apnea (adult) (pediatric): Secondary | ICD-10-CM | POA: Diagnosis not present

## 2021-01-23 DIAGNOSIS — E538 Deficiency of other specified B group vitamins: Secondary | ICD-10-CM | POA: Diagnosis not present

## 2021-01-23 DIAGNOSIS — R202 Paresthesia of skin: Secondary | ICD-10-CM | POA: Diagnosis not present

## 2021-01-23 DIAGNOSIS — M47816 Spondylosis without myelopathy or radiculopathy, lumbar region: Secondary | ICD-10-CM | POA: Diagnosis not present

## 2021-01-23 LAB — BAYER DCA HB A1C WAIVED: HB A1C (BAYER DCA - WAIVED): 5.2 % (ref 4.8–5.6)

## 2021-01-23 NOTE — Progress Notes (Signed)
Subjective: CC: Chronic shortness of breath, bilateral feet tingling PCP: Claretta Fraise, MD [id number] Amy Gray is a 61 y.o. female presenting to clinic today for:  1.  Chronic shortness of breath Patient with history of severe asthma, tracheomalacia and she is followed by pulmonology.  Most recent office visit was yesterday.  She was restarted on prednisone.  Too soon to tell if this is going to improve her shortness of breath and cough but she reports compliance with all inhalers.  No hemoptysis.  2.  Feet tingling Patient with bilateral feet tingling that is been ongoing for a few weeks now.  Symptoms seem to be more notable with certain positions like standing and/or sitting in certain positions.  She does have history of vitamin B12 deficiency and was due for her B12 injection this week but has not yet had it.  She does have a history of degenerative back disease and has previously required intervention for that but no recent follow-up.  No alcohol use.  No falls.  No history of diabetes but she has had several bouts of prednisone.   ROS: Per HPI  Allergies  Allergen Reactions   Pregabalin Shortness Of Breath   Chlorzoxazone Other (See Comments)    Decrease in BP    Diclofenac Other (See Comments)    Other reaction(s): Other Other reaction(s): Other Update Update Update Update Other reaction(s): Other Other reaction(s): Other Update Update Update Update    Metoclopramide Other (See Comments)    Restless legs   Sibutramine     Other reaction(s): Other update   Topiramate Other (See Comments)   Venlafaxine Other (See Comments)    Restless legs   Amitriptyline    Duloxetine Hcl    Guaifenesin Er    Oxybutynin    Gabapentin     Other reaction(s): Confusion   Propranolol Anxiety   Past Medical History:  Diagnosis Date   Anxiety    Asthma    COPD (chronic obstructive pulmonary disease) (HCC)    Depression    Emphysema of lung (HCC)    GERD  (gastroesophageal reflux disease)    Hyperlipidemia    Migraines    Osteoarthritis     Current Outpatient Medications:    acetaZOLAMIDE (DIAMOX) 250 MG tablet, Take 250 mg by mouth 2 (two) times daily. , Disp: , Rfl:    albuterol (PROVENTIL) (2.5 MG/3ML) 0.083% nebulizer solution, Take 3 mLs(1 VIAL) (2.5 mg total) by nebulization every 4 (four) hours as needed for wheezing or shortness of breath., Disp: 75 mL, Rfl: 10   albuterol (VENTOLIN HFA) 108 (90 Base) MCG/ACT inhaler, Inhale 2 puffs into the lungs every 6 (six) hours as needed for wheezing or shortness of breath., Disp: 8.5 g, Rfl: 11   BOTOX 200 units SOLR, , Disp: , Rfl:    Budeson-Glycopyrrol-Formoterol (BREZTRI AEROSPHERE) 160-9-4.8 MCG/ACT AERO, Inhale 2 puffs into the lungs in the morning and at bedtime., Disp: 10.7 g, Rfl: 11   budesonide (PULMICORT) 0.25 MG/2ML nebulizer solution, Take 2 mLs (0.25 mg total) by nebulization 2 (two) times daily as needed (shortness of breath)., Disp: 60 mL, Rfl: 12   butalbital-acetaminophen-caffeine (FIORICET, ESGIC) 50-325-40 MG tablet, , Disp: , Rfl:    cetirizine (ZYRTEC) 10 MG tablet, Take 10 mg by mouth daily., Disp: , Rfl:    Cholecalciferol (VITAMIN D) 2000 UNITS CAPS, Take 5,000 Units by mouth daily. , Disp: , Rfl:    cyanocobalamin (,VITAMIN B-12,) 1000 MCG/ML injection, Inject 1,000 mcg as directed every 30 (thirty)  days., Disp: 3 mL, Rfl: 0   estradiol (ESTRACE) 0.5 MG tablet, TAKE 1 TABLET BY MOUTH ONCE DAILY, Disp: 90 tablet, Rfl: 3   meclizine (ANTIVERT) 12.5 MG tablet, Take 1 tablet (12.5 mg total) by mouth 3 (three) times daily as needed for dizziness., Disp: 30 tablet, Rfl: 0   montelukast (SINGULAIR) 10 MG tablet, Take 1 tablet (10 mg total) by mouth at bedtime., Disp: 90 tablet, Rfl: 3   omeprazole (PRILOSEC) 40 MG capsule, Take 1 capsule (40 mg total) by mouth 2 (two) times daily., Disp: 180 capsule, Rfl: 3   prochlorperazine (COMPAZINE) 10 MG tablet, Take 10 mg by mouth every 6  (six) hours as needed for nausea or vomiting., Disp: , Rfl:    promethazine (PHENERGAN) 12.5 MG tablet, Take 12.5 mg by mouth every 8 (eight) hours as needed for nausea or vomiting., Disp: , Rfl:    rOPINIRole (REQUIP) 4 MG tablet, Take 1 tablet (4 mg total) by mouth at bedtime., Disp: 90 tablet, Rfl: 3   rosuvastatin (CRESTOR) 20 MG tablet, Take 1 tablet (20 mg total) by mouth daily., Disp: 90 tablet, Rfl: 3   sertraline (ZOLOFT) 100 MG tablet, Take 1 tablet (100 mg total) by mouth at bedtime., Disp: 90 tablet, Rfl: 3   traZODone (DESYREL) 150 MG tablet, TAKE 1/3 (ONE-THIRD) to 1 (ONE) tablet BY MOUTH nightly as needed for sleep., Disp: 90 tablet, Rfl: 3   Ubrogepant (UBRELVY PO), Take by mouth., Disp: , Rfl:   Current Facility-Administered Medications:    cyanocobalamin ((VITAMIN B-12)) injection 1,000 mcg, 1,000 mcg, Intramuscular, Q30 days, Claretta Fraise, MD, 1,000 mcg at 01/04/19 1137 Social History   Socioeconomic History   Marital status: Married    Spouse name: Not on file   Number of children: 2   Years of education: Not on file   Highest education level: 12th grade  Occupational History   Occupation: Disabled  Tobacco Use   Smoking status: Never   Smokeless tobacco: Never  Substance and Sexual Activity   Alcohol use: No    Alcohol/week: 0.0 standard drinks   Drug use: No   Sexual activity: Not on file  Other Topics Concern   Not on file  Social History Narrative   Not on file   Social Determinants of Health   Financial Resource Strain: Not on file  Food Insecurity: Not on file  Transportation Needs: Not on file  Physical Activity: Not on file  Stress: Not on file  Social Connections: Not on file  Intimate Partner Violence: Not on file   Family History  Problem Relation Age of Onset   Arthritis Mother    Asthma Mother    Heart disease Mother    Arthritis Father    Heart disease Father    Heart attack Father    Hypertension Sister     Objective: Office  vital signs reviewed. BP 106/73    Pulse 77    Temp (!) 97.2 F (36.2 C)    Ht $R'5\' 2"'iZ$  (1.575 m)    Wt 175 lb 3.2 oz (79.5 kg)    SpO2 96%    BMI 32.04 kg/m   Physical Examination:  General: Awake, alert, nontoxic female, No acute distress Cardio: regular rate and rhythm, S1S2 heard, no murmurs appreciated Pulm: clear to auscultation bilaterally, no wheezes, rhonchi or rales; normal work of breathing on room air.  Does cough intermittently. MSK: Ambulating independently Neuro: Vibratory sensation intact bilaterally.  Monofilament sensation intact.  No appreciable abnormalities  on exam  Lab Results  Component Value Date   HGBA1C 5.2 01/23/2021     Assessment/ Plan: 61 y.o. female   Severe persistent asthma, unspecified whether complicated  Tracheobronchomalacia  DDD (degenerative disc disease), lumbar - Plan: DG Lumbar Spine 2-3 Views  Numbness and tingling of both feet - Plan: Bayer DCA Hb A1c Waived, CMP14+EGFR, Vitamin B12, DG Lumbar Spine 2-3 Views  Vitamin B12 deficiency - Plan: Vitamin B12  Asthma is managed by pulmonology.  Continue current regimen as recommended.  Lung exam was unremarkable today  Given degenerative changes of the lumbar spine, plain films were obtained to look for any significant changes.  Personal review did show ongoing degenerative changes and this was reinforced by radiology.  I think she would benefit from reevaluation by her spinal specialist given this intermittent numbness and tingling of the feet that seems to be positional in nature.  We will look for any metabolic etiology but so far A1c does not demonstrate evidence of diabetes despite recurrent use of prednisone.  We will check B12 level given known B12 deficiency with recent missed B12 dose.   No orders of the defined types were placed in this encounter.  No orders of the defined types were placed in this encounter.    Janora Norlander, DO [place] [phone]

## 2021-01-23 NOTE — Patient Instructions (Signed)
We'll check your back for any progression given numbness and tingling Checking labs as well. Continue to follow up with Dr Darlyn Read and your lung specialist as scheduled

## 2021-01-24 LAB — CMP14+EGFR
ALT: 26 IU/L (ref 0–32)
AST: 19 IU/L (ref 0–40)
Albumin/Globulin Ratio: 1.9 (ref 1.2–2.2)
Albumin: 4.4 g/dL (ref 3.8–4.9)
Alkaline Phosphatase: 141 IU/L — ABNORMAL HIGH (ref 44–121)
BUN/Creatinine Ratio: 15 (ref 12–28)
BUN: 13 mg/dL (ref 8–27)
Bilirubin Total: <0.2 mg/dL (ref 0.0–1.2)
CO2: 20 mmol/L (ref 20–29)
Calcium: 8.8 mg/dL (ref 8.7–10.3)
Chloride: 110 mmol/L — ABNORMAL HIGH (ref 96–106)
Creatinine, Ser: 0.89 mg/dL (ref 0.57–1.00)
Globulin, Total: 2.3 g/dL (ref 1.5–4.5)
Glucose: 81 mg/dL (ref 70–99)
Potassium: 3.6 mmol/L (ref 3.5–5.2)
Sodium: 144 mmol/L (ref 134–144)
Total Protein: 6.7 g/dL (ref 6.0–8.5)
eGFR: 74 mL/min/{1.73_m2} (ref >59)

## 2021-01-24 LAB — VITAMIN B12: Vitamin B-12: 794 pg/mL (ref 232–1245)

## 2021-02-12 ENCOUNTER — Other Ambulatory Visit: Payer: Self-pay | Admitting: Family Medicine

## 2021-02-12 DIAGNOSIS — G43709 Chronic migraine without aura, not intractable, without status migrainosus: Secondary | ICD-10-CM | POA: Diagnosis not present

## 2021-02-12 DIAGNOSIS — E538 Deficiency of other specified B group vitamins: Secondary | ICD-10-CM

## 2021-02-19 DIAGNOSIS — M85852 Other specified disorders of bone density and structure, left thigh: Secondary | ICD-10-CM | POA: Diagnosis not present

## 2021-02-19 DIAGNOSIS — M8589 Other specified disorders of bone density and structure, multiple sites: Secondary | ICD-10-CM | POA: Diagnosis not present

## 2021-02-23 DIAGNOSIS — G4733 Obstructive sleep apnea (adult) (pediatric): Secondary | ICD-10-CM | POA: Diagnosis not present

## 2021-03-12 ENCOUNTER — Telehealth: Payer: Self-pay | Admitting: Family Medicine

## 2021-03-12 NOTE — Telephone Encounter (Signed)
Pt needs [place] to put her most recent xrays on a disc. Call pt when they are ready for pick up. She needs the disc her appt with the Orthopedic on 03/21/21.

## 2021-03-15 NOTE — Telephone Encounter (Signed)
Disc burned and up front to pick up - patient aware

## 2021-04-01 DIAGNOSIS — J479 Bronchiectasis, uncomplicated: Secondary | ICD-10-CM | POA: Diagnosis not present

## 2021-04-01 DIAGNOSIS — Z9989 Dependence on other enabling machines and devices: Secondary | ICD-10-CM | POA: Diagnosis not present

## 2021-04-01 DIAGNOSIS — G4733 Obstructive sleep apnea (adult) (pediatric): Secondary | ICD-10-CM | POA: Diagnosis not present

## 2021-04-01 DIAGNOSIS — J398 Other specified diseases of upper respiratory tract: Secondary | ICD-10-CM | POA: Diagnosis not present

## 2021-04-08 ENCOUNTER — Other Ambulatory Visit: Payer: Self-pay | Admitting: Family Medicine

## 2021-04-08 DIAGNOSIS — J45909 Unspecified asthma, uncomplicated: Secondary | ICD-10-CM

## 2021-04-10 DIAGNOSIS — H524 Presbyopia: Secondary | ICD-10-CM | POA: Diagnosis not present

## 2021-04-10 DIAGNOSIS — H5213 Myopia, bilateral: Secondary | ICD-10-CM | POA: Diagnosis not present

## 2021-04-19 NOTE — Progress Notes (Signed)
DEXA shows osteopenia. I recommend weekly fosamax. ?Nurse, if pt. Is agreeable, send in Fosamax 70 mg weekly, #13. ? ?Thanks, ?WS ?

## 2021-04-24 DIAGNOSIS — R1013 Epigastric pain: Secondary | ICD-10-CM | POA: Diagnosis not present

## 2021-04-24 DIAGNOSIS — R197 Diarrhea, unspecified: Secondary | ICD-10-CM | POA: Diagnosis not present

## 2021-04-24 DIAGNOSIS — R194 Change in bowel habit: Secondary | ICD-10-CM | POA: Diagnosis not present

## 2021-04-25 DIAGNOSIS — R140 Abdominal distension (gaseous): Secondary | ICD-10-CM | POA: Diagnosis not present

## 2021-04-25 DIAGNOSIS — R197 Diarrhea, unspecified: Secondary | ICD-10-CM | POA: Diagnosis not present

## 2021-05-01 DIAGNOSIS — J4550 Severe persistent asthma, uncomplicated: Secondary | ICD-10-CM | POA: Diagnosis not present

## 2021-05-01 DIAGNOSIS — J310 Chronic rhinitis: Secondary | ICD-10-CM | POA: Diagnosis not present

## 2021-05-01 DIAGNOSIS — J479 Bronchiectasis, uncomplicated: Secondary | ICD-10-CM | POA: Diagnosis not present

## 2021-05-01 DIAGNOSIS — J398 Other specified diseases of upper respiratory tract: Secondary | ICD-10-CM | POA: Diagnosis not present

## 2021-05-02 ENCOUNTER — Ambulatory Visit: Payer: Medicare Other | Admitting: Family Medicine

## 2021-05-08 ENCOUNTER — Ambulatory Visit (INDEPENDENT_AMBULATORY_CARE_PROVIDER_SITE_OTHER): Payer: Medicare Other | Admitting: Family Medicine

## 2021-05-08 ENCOUNTER — Encounter: Payer: Self-pay | Admitting: Family Medicine

## 2021-05-08 VITALS — BP 94/63 | HR 80 | Temp 97.7°F | Ht 62.0 in | Wt 175.8 lb

## 2021-05-08 DIAGNOSIS — R1010 Upper abdominal pain, unspecified: Secondary | ICD-10-CM

## 2021-05-08 DIAGNOSIS — E559 Vitamin D deficiency, unspecified: Secondary | ICD-10-CM

## 2021-05-08 DIAGNOSIS — M797 Fibromyalgia: Secondary | ICD-10-CM

## 2021-05-08 DIAGNOSIS — E782 Mixed hyperlipidemia: Secondary | ICD-10-CM

## 2021-05-08 DIAGNOSIS — F411 Generalized anxiety disorder: Secondary | ICD-10-CM

## 2021-05-08 DIAGNOSIS — H6592 Unspecified nonsuppurative otitis media, left ear: Secondary | ICD-10-CM

## 2021-05-08 DIAGNOSIS — E538 Deficiency of other specified B group vitamins: Secondary | ICD-10-CM

## 2021-05-08 MED ORDER — ROSUVASTATIN CALCIUM 10 MG PO TABS: 10.0000 mg | ORAL_TABLET | Freq: Every day | ORAL | 3 refills | Status: DC

## 2021-05-08 MED ORDER — CEFPROZIL 500 MG PO TABS: 500.0000 mg | ORAL_TABLET | Freq: Two times a day (BID) | ORAL | 0 refills | Status: DC

## 2021-05-08 MED ORDER — BREZTRI AEROSPHERE 160-9-4.8 MCG/ACT IN AERO: 2.0000 | INHALATION_SPRAY | Freq: Two times a day (BID) | RESPIRATORY_TRACT | 11 refills | Status: DC

## 2021-05-08 NOTE — Progress Notes (Signed)
? ?Subjective:  ?Patient ID: Amy Gray, female    DOB: [birth year]  Age: 61 y.o. MRN: [id number] ? ?CC: Medical Management of Chronic Issues ? ? ?HPI ?Amy Gray presents for stomach problem. Amy Gray feels it is from prednisone treatemnt. Has burning. Abd. Distended. TAking prilosec 40 mg BID and pepcid from GI. Was told to return as needed. ? ?Saw Dr. Henderson [place] of allergy last week.. ? ?Last week had bad diarrhea, body aches for three days. That has improved. Still having 2 BMs a day.  ? ?Asthma a little better. Using breztri and albuterol (MDI & Neb) ? ? in for follow-up of elevated cholesterol. Doing well without complaints on current medication. Denies  myalgia and arthralgia .Had to decrease to 10 mg of Crestor due to memory loss. Better after the change. Currently no chest pain, shortness of breath or other cardiovascular related symptoms noted. ? ? ? ?  05/08/2021  ? 10:15 AM 01/23/2021  ? 10:24 AM 10/09/2020  ? 11:14 AM  ?Depression screen PHQ 2/9  ?Decreased Interest 0 0 0  ?Down, Depressed, Hopeless 0 0 0  ?PHQ - 2 Score 0 0 0  ?Altered sleeping   0  ?Tired, decreased energy   1  ?Change in appetite   0  ?Feeling bad or failure about yourself    0  ?Trouble concentrating   0  ?Moving slowly or fidgety/restless   0  ?Suicidal thoughts   0  ?PHQ-9 Score   1  ?Difficult doing work/chores   Not difficult at all  ? ? ?  01/23/2021  ? 10:24 AM 10/24/2015  ?  9:27 AM 04/23/2015  ? 11:13 AM  ?GAD 7 : Generalized Anxiety Score  ?Nervous, Anxious, on Edge 0 0 0  ?Control/stop worrying 0 0 0  ?Worry too much - different things 0 0 0  ?Trouble relaxing 0 3 0  ?Restless 0 2 3  ?Easily annoyed or irritable 0 0 1  ?Afraid - awful might happen 0 0 0  ?Total GAD 7 Score 0 5 4  ?Anxiety Difficulty Not difficult at all Not difficult at all Not difficult at all  ? ? ? ? ?History ?Amy Gray has a past medical history of Anxiety, Asthma, COPD (chronic obstructive pulmonary disease) ([place]), Depression, Emphysema of lung ([place]), GERD  (gastroesophageal reflux disease), Hyperlipidemia, Migraines, and Osteoarthritis.  ? ?She has a past surgical history that includes Cervical fusion (2015); Rhinoplasty; and Abdominal hysterectomy.  ? ?Her family history includes Arthritis in her father and mother; Asthma in her mother; Heart attack in her father; Heart disease in her father and mother; Hypertension in her sister.She reports that she has never smoked. She has never used smokeless tobacco. She reports that she does not drink alcohol and does not use drugs. ? ? ? ?ROS ?Review of Systems  ?Constitutional: Negative.  Negative for activity change.  ?HENT:  Positive for ear pain (left).   ?Eyes:  Negative for visual disturbance.  ?Respiratory:  Positive for shortness of breath.   ?Cardiovascular:  Negative for chest pain.  ?Gastrointestinal:  Positive for abdominal distention, abdominal pain, diarrhea and nausea.  ?Musculoskeletal:  Negative for arthralgias.  ? ?Objective:  ?BP 94/63   Pulse 80   Temp 97.7 ?F (36.5 ?C)   Ht _0  (1.575 m)   Wt 175 lb 12.8 oz (79.7 kg)   SpO2 95%   BMI 32.15 kg/m?  ? ?BP Readings from Last 3 Encounters:  ?05/08/21 94/63  ?01/23/21 106/73  ?10/09/20 105/71  ? ? ?  Wt Readings from Last 3 Encounters:  ?05/08/21 175 lb 12.8 oz (79.7 kg)  ?01/23/21 175 lb 3.2 oz (79.5 kg)  ?10/09/20 170 lb 3.2 oz (77.2 kg)  ? ? ? ?Physical Exam ?Constitutional:   ?   General: She is not in acute distress. ?   Appearance: She is well-developed.  ?HENT:  ?   Head: Normocephalic and atraumatic.  ?Eyes:  ?   Conjunctiva/sclera: Conjunctivae normal.  ?   Pupils: Pupils are equal, round, and reactive to light.  ?Neck:  ?   Thyroid: No thyromegaly.  ?Cardiovascular:  ?   Rate and Rhythm: Normal rate and regular rhythm.  ?   Heart sounds: Normal heart sounds. No murmur heard. ?Pulmonary:  ?   Effort: Pulmonary effort is normal. No respiratory distress.  ?   Breath sounds: Normal breath sounds. No wheezing or rales.  ?Abdominal:  ?   General: Bowel  sounds are normal. There is no distension.  ?   Palpations: Abdomen is soft.  ?   Tenderness: There is abdominal tenderness (mild BUQ at costal margin).  ?Musculoskeletal:     ?   General: Normal range of motion.  ?   Cervical back: Normal range of motion and neck supple.  ?Lymphadenopathy:  ?   Cervical: No cervical adenopathy.  ?Skin: ?   General: Skin is warm and dry.  ?Neurological:  ?   Mental Status: She is alert and oriented to person, place, and time.  ?Psychiatric:     ?   Behavior: Behavior normal.     ?   Thought Content: Thought content normal.     ?   Judgment: Judgment normal.  ? ? ? ? ?Assessment & Plan:  ? ?Amy Gray was seen today for medical management of chronic issues. ? ?Diagnoses and all orders for this visit: ? ?Fibromyalgia syndrome ?-     CBC with Differential/Platelet ?-     CMP14+EGFR ? ?Mixed hyperlipidemia ?-     rosuvastatin (CRESTOR) 10 MG tablet; Take 1 tablet (10 mg total) by mouth daily. ?-     CBC with Differential/Platelet ?-     CMP14+EGFR ?-     Lipid panel ? ?Generalized anxiety disorder ?-     CBC with Differential/Platelet ?-     CMP14+EGFR ? ?Left otitis media with effusion ?-     CBC with Differential/Platelet ?-     CMP14+EGFR ? ?Vitamin B12 deficiency ?-     CBC with Differential/Platelet ?-     CMP14+EGFR ?-     Vitamin B12 ? ?Vitamin D deficiency ?-     CBC with Differential/Platelet ?-     CMP14+EGFR ?-     VITAMIN D 25 Hydroxy (Vit-D Deficiency, Fractures) ? ?Pain of upper abdomen ? ?Other orders ?-     Budeson-Glycopyrrol-Formoterol (BREZTRI AEROSPHERE) 160-9-4.8 MCG/ACT AERO; Inhale 2 puffs into the lungs in the morning and at bedtime. ?-     cefPROZIL (CEFZIL) 500 MG tablet; Take 1 tablet (500 mg total) by mouth 2 (two) times daily. For infection. Take all of this medication. ? ? ? ? ? ? ?I have discontinued Amy Gray's predniSONE. I have also changed her rosuvastatin. Additionally, I am having her start on cefPROZIL. Lastly, I am having her maintain her  Vitamin D, acetaZOLAMIDE, Botox, butalbital-acetaminophen-caffeine, Ubrogepant (UBRELVY PO), cetirizine, promethazine, prochlorperazine, meclizine, albuterol, albuterol, budesonide, montelukast, omeprazole, rOPINIRole, sertraline, traZODone, estradiol, cyanocobalamin, and Breztri Aerosphere. We will continue to administer cyanocobalamin. ? ?Allergies as of 05/08/2021   ? ?  Reactions  ? Pregabalin Shortness Of Breath  ? Chlorzoxazone Other (See Comments)  ? Decrease in BP   ? Diclofenac Other (See Comments)  ? Other reaction(s): Other ?Other reaction(s): Other ?Update ?Update ?Update ?Update ?Other reaction(s): Other ?Other reaction(s): Other ?Update ?Update ?Update ?Update  ? Metoclopramide Other (See Comments)  ? Restless legs  ? Sibutramine   ? Other reaction(s): Other ?update  ? Topiramate Other (See Comments)  ? Venlafaxine Other (See Comments)  ? Restless legs  ? Amitriptyline   ? Duloxetine Hcl   ? Guaifenesin Er   ? Oxybutynin   ? Gabapentin   ? Other reaction(s): Confusion  ? Propranolol Anxiety  ? ?  ? ?  ?Medication List  ?  ? ?  ? Accurate as of May 08, 2021 11:02 AM. If you have any questions, ask your nurse or doctor.  ?  ?  ? ?  ? ?STOP taking these medications   ? ?predniSONE 5 MG (21) Tbpk tablet ?Commonly known as: STERAPRED UNI-PAK 21 TAB ?Stopped by: Claretta Fraise, MD ?  ? ?  ? ?TAKE these medications   ? ?acetaZOLAMIDE 250 MG tablet ?Commonly known as: DIAMOX ?Take 250 mg by mouth 2 (two) times daily. ?  ?albuterol 108 (90 Base) MCG/ACT inhaler ?Commonly known as: VENTOLIN HFA ?Inhale 2 puffs into the lungs every 6 (six) hours as needed for wheezing or shortness of breath. ?  ?albuterol (2.5 MG/3ML) 0.083% nebulizer solution ?Commonly known as: PROVENTIL ?Take 3 mLs(1 VIAL) (2.5 mg total) by nebulization every 4 (four) hours as needed for wheezing or shortness of breath. ?  ?Botox 200 units Solr ?Generic drug: Botulinum Toxin Type A ?  ?Breztri Aerosphere 160-9-4.8 MCG/ACT Aero ?Generic drug:  Budeson-Glycopyrrol-Formoterol ?Inhale 2 puffs into the lungs in the morning and at bedtime. ?  ?budesonide 0.25 MG/2ML nebulizer solution ?Commonly known as: PULMICORT ?Take 2 mLs (0.25 mg total) by nebulization

## 2021-05-09 LAB — CBC WITH DIFFERENTIAL/PLATELET
Basophils Absolute: 0.1 10*3/uL (ref 0.0–0.2)
Basos: 1 %
EOS (ABSOLUTE): 0.1 10*3/uL (ref 0.0–0.4)
Eos: 1 %
Hematocrit: 39.3 % (ref 34.0–46.6)
Hemoglobin: 13.2 g/dL (ref 11.1–15.9)
Immature Grans (Abs): 0.1 10*3/uL (ref 0.0–0.1)
Immature Granulocytes: 2 %
Lymphocytes Absolute: 2.3 10*3/uL (ref 0.7–3.1)
Lymphs: 33 %
MCH: 29.8 pg (ref 26.6–33.0)
MCHC: 33.6 g/dL (ref 31.5–35.7)
MCV: 89 fL (ref 79–97)
Monocytes Absolute: 0.7 10*3/uL (ref 0.1–0.9)
Monocytes: 10 %
Neutrophils Absolute: 3.7 10*3/uL (ref 1.4–7.0)
Neutrophils: 53 %
Platelets: 319 10*3/uL (ref 150–450)
RBC: 4.43 x10E6/uL (ref 3.77–5.28)
RDW: 13.3 % (ref 11.7–15.4)
WBC: 7 10*3/uL (ref 3.4–10.8)

## 2021-05-09 LAB — CMP14+EGFR
ALT: 17 IU/L (ref 0–32)
AST: 15 IU/L (ref 0–40)
Albumin/Globulin Ratio: 1.9 (ref 1.2–2.2)
Albumin: 4.3 g/dL (ref 3.8–4.9)
Alkaline Phosphatase: 115 IU/L (ref 44–121)
BUN/Creatinine Ratio: 16 (ref 12–28)
BUN: 16 mg/dL (ref 8–27)
Bilirubin Total: 0.2 mg/dL (ref 0.0–1.2)
CO2: 20 mmol/L (ref 20–29)
Calcium: 9 mg/dL (ref 8.7–10.3)
Chloride: 110 mmol/L — ABNORMAL HIGH (ref 96–106)
Creatinine, Ser: 1.02 mg/dL — ABNORMAL HIGH (ref 0.57–1.00)
Globulin, Total: 2.3 g/dL (ref 1.5–4.5)
Glucose: 94 mg/dL (ref 70–99)
Potassium: 3.9 mmol/L (ref 3.5–5.2)
Sodium: 143 mmol/L (ref 134–144)
Total Protein: 6.6 g/dL (ref 6.0–8.5)
eGFR: 63 mL/min/{1.73_m2} (ref >59)

## 2021-05-09 LAB — LIPID PANEL
Chol/HDL Ratio: 3.3 ratio (ref 0.0–4.4)
Cholesterol, Total: 192 mg/dL (ref 100–199)
HDL: 58 mg/dL (ref >39)
LDL Chol Calc (NIH): 117 mg/dL — ABNORMAL HIGH (ref 0–99)
Triglycerides: 93 mg/dL (ref 0–149)
VLDL Cholesterol Cal: 17 mg/dL (ref 5–40)

## 2021-05-09 LAB — VITAMIN B12: Vitamin B-12: 897 pg/mL (ref 232–1245)

## 2021-05-09 LAB — VITAMIN D 25 HYDROXY (VIT D DEFICIENCY, FRACTURES): Vit D, 25-Hydroxy: 87.5 ng/mL (ref 30.0–100.0)

## 2021-05-12 NOTE — Progress Notes (Signed)
Hello Mairin,  Your lab result is normal and/or stable.Some minor variations that are not significant are commonly marked abnormal, but do not represent any medical problem for you.  Best regards, Arelene Moroni, M.D.

## 2021-06-13 DIAGNOSIS — M47816 Spondylosis without myelopathy or radiculopathy, lumbar region: Secondary | ICD-10-CM | POA: Diagnosis not present

## 2021-06-18 DIAGNOSIS — G43719 Chronic migraine without aura, intractable, without status migrainosus: Secondary | ICD-10-CM | POA: Diagnosis not present

## 2021-06-18 DIAGNOSIS — G932 Benign intracranial hypertension: Secondary | ICD-10-CM | POA: Diagnosis not present

## 2021-07-09 ENCOUNTER — Ambulatory Visit (INDEPENDENT_AMBULATORY_CARE_PROVIDER_SITE_OTHER): Payer: Medicare Other | Admitting: Physician Assistant

## 2021-07-09 ENCOUNTER — Encounter: Payer: Self-pay | Admitting: Physician Assistant

## 2021-07-09 VITALS — BP 98/65 | HR 101 | Temp 97.5°F | Ht 62.0 in | Wt 175.0 lb

## 2021-07-09 DIAGNOSIS — M25551 Pain in right hip: Secondary | ICD-10-CM | POA: Diagnosis not present

## 2021-07-09 DIAGNOSIS — M25552 Pain in left hip: Secondary | ICD-10-CM | POA: Diagnosis not present

## 2021-07-09 NOTE — Patient Instructions (Signed)
Bursitis  Bursitis is when the fluid-filled sac (bursa) that covers and protects a joint is swollen (inflamed). Bursitis is most common near joints such as the knees, elbows, hips, and shoulders. It can cause pain and stiffness. What are the causes? An injury to a joint area. Repeated use of a joint. Infection. Certain conditions that cause swelling. What increases the risk? Putting stress on a joint over and over again. Having a condition that weakens your body's defense system (immune system). Doing any of these often: Lifting and reaching overhead. Kneeling or leaning on hard surfaces. Doing activities that have a motion that you do over and over again. This includes running and walking. What are the signs or symptoms? Common symptoms of this condition include: Pain that gets worse when you move the affected body part or use it to support your body weight. Irritation and swelling (inflammation). Stiffness. Other symptoms include: Redness. Swelling. Tenderness. Warmth. Pain that stays after rest. Fever or chills if there is an infection. How is this treated? This condition can often be treated at home with: Rest. Ice. Wrapping the area with an elastic bandage (compression). Keeping the affected area raised (elevation). Other treatments may include: Medicine for pain and swelling. Shots of medicine to the area to lessen swelling. Draining fluid out of the bursa. Antibiotic medicine for infection. Using a splint, brace, wrap, pads, or walking aid. Therapy if pain continues or you have limited movement. Surgery. Follow these instructions at home: Medicines Take over-the-counter and prescription medicines only as told by your doctor. If you were prescribed an antibiotic medicine, take it as told by your doctor. Do not stop taking it even if you start to feel better. Managing pain, stiffness, and swelling     Raise the injured area above the level of your heart while you  are sitting or lying down. If told, put ice on the affected area. To do this: Put ice in a plastic bag. Place a towel between your skin and the bag, or between your splint or brace and the bag. Leave the ice on for 20 minutes, 2-3 times a day. Take off the ice if your skin turns bright red. This is very important. If you cannot feel pain, heat, or cold, you have a greater risk of damage to the area. If told, put heat on the affected area. Do this as often as told by your doctor. Use the heat source that your doctor recommends, such as a moist heat pack or a heating pad. Place a towel between your skin and the heat source. Leave the heat on for 20-30 minutes. Take off the heat if your skin turns bright red. This is very important. If you cannot feel pain, heat, or cold, you have a greater risk of getting burned. General instructions Rest the affected area as told by your doctor. Avoid doing things that make the pain worse. Use a splint, brace, pad, wrap, or walking aid as told by your doctor. Keep all follow-up visits. Preventing symptoms Wear knee pads if you kneel often. Wear running or walking shoes that fit you well. Take a lot of breaks during activities that involve doing the same movements again and again. Before you do any activity that takes a lot of effort, get your body ready by stretching. Stay at a healthy weight or lose weight if your doctor says you should. If you need help doing this, ask your doctor. Exercise often. If you start any new physical activity, do it  slowly. Work with your physical or occupational therapist and doctor to find what caused the bursitis. Contact a doctor if: You have a fever or chills. You have symptoms that do not get better with treatment. You have pain or swelling that: Gets worse. Goes away and then comes back. You have pus coming from the affected area. You have redness around the affected area. The affected area is warm to the  touch. Summary Bursitis is when the fluid-filled sac (bursa) that covers and protects a joint is swollen. Rest the affected area as told by your doctor. Avoid doing things that make the pain worse. Put ice on the affected area as told by your doctor. This information is not intended to replace advice given to you by your health care provider. Make sure you discuss any questions you have with your health care provider. Document Revised: 01/01/2021 Document Reviewed: 01/01/2021 Elsevier Patient Education  [place].

## 2021-07-09 NOTE — Progress Notes (Signed)
  Subjective:     Patient ID: Amy Gray, female   DOB: 17-Sep-1960, 61 y.o.   MRN: [id number]  HPI Pt here with bilat lateral hip pain with R>L Hx of same in the past Prev hip injection at Ortho ~ 1 yr ago Sig helped sx Requesting injection today  Review of Systems     Objective:   Physical Exam Nl gait FROM of bilat hips ++ TTP over the lateral hip bilat No area of ecchy,edema, or induration noted Discussed injection and verbal consent obtained Lateral hips were cleansed with Betadine 1.5 cc lido/1.5 cc Celestone given under sterile technique to both lateral hips at area of maximum TTP Dressings applied    Assessment:     1. Bilateral hip pain        Plan:     Rest OTC meds prn Nl course reviewed F/U prn PATP

## 2021-07-31 ENCOUNTER — Ambulatory Visit (INDEPENDENT_AMBULATORY_CARE_PROVIDER_SITE_OTHER): Payer: Medicare Other

## 2021-07-31 VITALS — Ht 62.0 in | Wt 175.0 lb

## 2021-07-31 DIAGNOSIS — Z0000 Encounter for general adult medical examination without abnormal findings: Secondary | ICD-10-CM

## 2021-07-31 NOTE — Patient Instructions (Signed)
Amy Gray , Thank you for taking time to come for your Medicare Wellness Visit. I appreciate your ongoing commitment to your health goals. Please review the following plan we discussed and let me know if I can assist you in the future.   Screening recommendations/referrals: Colonoscopy: Done 05/18/2013 Repeat in 10 years  Mammogram: Done 06/28/2020. Repeat annually  Bone Density: Due at age 61.  Recommended yearly ophthalmology/optometry visit for glaucoma screening and checkup Recommended yearly dental visit for hygiene and checkup  Vaccinations: Influenza vaccine: Done 12/05/2020 Repeat annually  Pneumococcal vaccine: Done 12/21/2014 and 12/05/2020. Tdap vaccine: Done 01/21/2006. Repeat in 10 years  Shingles vaccine: Available at local pharmacy.  Covid-19: Available at local pharmacy.  Advanced directives: Please bring a copy of your health care power of attorney and living will to the office to be added to your chart at your convenience. Paperwork is available at [facility].  Conditions/risks identified: Aim for 30 minutes of exercise or brisk walking, 6-8 glasses of water, and 5 servings of fruits and vegetables each day.   Next appointment: Follow up in one year for your annual wellness visit. 2024.  Preventive Care 40-64 Years, Female Preventive care refers to lifestyle choices and visits with your health care provider that can promote health and wellness. What does preventive care include? A yearly physical exam. This is also called an annual well check. Dental exams once or twice a year. Routine eye exams. Ask your health care provider how often you should have your eyes checked. Personal lifestyle choices, including: Daily care of your teeth and gums. Regular physical activity. Eating a healthy diet. Avoiding tobacco and drug use. Limiting alcohol use. Practicing safe sex. Taking low-dose aspirin daily starting at age 6. Taking vitamin and mineral supplements as  recommended by your health care provider. What happens during an annual well check? The services and screenings done by your health care provider during your annual well check will depend on your age, overall health, lifestyle risk factors, and family history of disease. Counseling  Your health care provider may ask you questions about your: Alcohol use. Tobacco use. Drug use. Emotional well-being. Home and relationship well-being. Sexual activity. Eating habits. Work and work Statistician. Method of birth control. Menstrual cycle. Pregnancy history. Screening  You may have the following tests or measurements: Height, weight, and BMI. Blood pressure. Lipid and cholesterol levels. These may be checked every 5 years, or more frequently if you are over 45 years old. Skin check. Lung cancer screening. You may have this screening every year starting at age 93 if you have a 30-pack-year history of smoking and currently smoke or have quit within the past 15 years. Fecal occult blood test (FOBT) of the stool. You may have this test every year starting at age 61. Flexible sigmoidoscopy or colonoscopy. You may have a sigmoidoscopy every 5 years or a colonoscopy every 10 years starting at age 40. Hepatitis C blood test. Hepatitis B blood test. Sexually transmitted disease (STD) testing. Diabetes screening. This is done by checking your blood sugar (glucose) after you have not eaten for a while (fasting). You may have this done every 1-3 years. Mammogram. This may be done every 1-2 years. Talk to your health care provider about when you should start having regular mammograms. This may depend on whether you have a family history of breast cancer. BRCA-related cancer screening. This may be done if you have a family history of breast, ovarian, tubal, or peritoneal cancers. Pelvic exam and Pap  test. This may be done every 3 years starting at age 44. Starting at age 36, this may be done every 5 years if  you have a Pap test in combination with an HPV test. Bone density scan. This is done to screen for osteoporosis. You may have this scan if you are at high risk for osteoporosis. Discuss your test results, treatment options, and if necessary, the need for more tests with your health care provider. Vaccines  Your health care provider may recommend certain vaccines, such as: Influenza vaccine. This is recommended every year. Tetanus, diphtheria, and acellular pertussis (Tdap, Td) vaccine. You may need a Td booster every 10 years. Zoster vaccine. You may need this after age 46. Pneumococcal 13-valent conjugate (PCV13) vaccine. You may need this if you have certain conditions and were not previously vaccinated. Pneumococcal polysaccharide (PPSV23) vaccine. You may need one or two doses if you smoke cigarettes or if you have certain conditions. Talk to your health care provider about which screenings and vaccines you need and how often you need them. This information is not intended to replace advice given to you by your health care provider. Make sure you discuss any questions you have with your health care provider. Document Released: 02/02/2015 Document Revised: 09/26/2015 Document Reviewed: 11/07/2014 Elsevier Interactive Patient Education  2017 [employer] Prevention in the Home Falls can cause injuries. They can happen to people of all ages. There are many things you can do to make your home safe and to help prevent falls. What can I do on the outside of my home? Regularly fix the edges of walkways and driveways and fix any cracks. Remove anything that might make you trip as you walk through a door, such as a raised step or threshold. Trim any bushes or trees on the path to your home. Use bright outdoor lighting. Clear any walking paths of anything that might make someone trip, such as rocks or tools. Regularly check to see if handrails are loose or broken. Make sure that both  sides of any steps have handrails. Any raised decks and porches should have guardrails on the edges. Have any leaves, snow, or ice cleared regularly. Use sand or salt on walking paths during winter. Clean up any spills in your garage right away. This includes oil or grease spills. What can I do in the bathroom? Use night lights. Install grab bars by the toilet and in the tub and shower. Do not use towel bars as grab bars. Use non-skid mats or decals in the tub or shower. If you need to sit down in the shower, use a plastic, non-slip stool. Keep the floor dry. Clean up any water that spills on the floor as soon as it happens. Remove soap buildup in the tub or shower regularly. Attach bath mats securely with double-sided non-slip rug tape. Do not have throw rugs and other things on the floor that can make you trip. What can I do in the bedroom? Use night lights. Make sure that you have a light by your bed that is easy to reach. Do not use any sheets or blankets that are too big for your bed. They should not hang down onto the floor. Have a firm chair that has side arms. You can use this for support while you get dressed. Do not have throw rugs and other things on the floor that can make you trip. What can I do in the kitchen? Clean up any spills  right away. Avoid walking on wet floors. Keep items that you use a lot in easy-to-reach places. If you need to reach something above you, use a strong step stool that has a grab bar. Keep electrical cords out of the way. Do not use floor polish or wax that makes floors slippery. If you must use wax, use non-skid floor wax. Do not have throw rugs and other things on the floor that can make you trip. What can I do with my stairs? Do not leave any items on the stairs. Make sure that there are handrails on both sides of the stairs and use them. Fix handrails that are broken or loose. Make sure that handrails are as long as the stairways. Check any  carpeting to make sure that it is firmly attached to the stairs. Fix any carpet that is loose or worn. Avoid having throw rugs at the top or bottom of the stairs. If you do have throw rugs, attach them to the floor with carpet tape. Make sure that you have a light switch at the top of the stairs and the bottom of the stairs. If you do not have them, ask someone to add them for you. What else can I do to help prevent falls? Wear shoes that: Do not have high heels. Have rubber bottoms. Are comfortable and fit you well. Are closed at the toe. Do not wear sandals. If you use a stepladder: Make sure that it is fully opened. Do not climb a closed stepladder. Make sure that both sides of the stepladder are locked into place. Ask someone to hold it for you, if possible. Clearly mark and make sure that you can see: Any grab bars or handrails. First and last steps. Where the edge of each step is. Use tools that help you move around (mobility aids) if they are needed. These include: Canes. Walkers. Scooters. Crutches. Turn on the lights when you go into a dark area. Replace any light bulbs as soon as they burn out. Set up your furniture so you have a clear path. Avoid moving your furniture around. If any of your floors are uneven, fix them. If there are any pets around you, be aware of where they are. Review your medicines with your doctor. Some medicines can make you feel dizzy. This can increase your chance of falling. Ask your doctor what other things that you can do to help prevent falls. This information is not intended to replace advice given to you by your health care provider. Make sure you discuss any questions you have with your health care provider. Document Released: 11/02/2008 Document Revised: 06/14/2015 Document Reviewed: 02/10/2014 Elsevier Interactive Patient Education  2017 [employer].

## 2021-07-31 NOTE — Progress Notes (Signed)
Subjective:   Amy Gray is a 61 y.o. female who presents for Medicare Annual (Subsequent) preventive examination. Virtual Visit via Telephone Note  I connected with  [name] on 07/31/21 at  9:00 AM EDT by telephone and verified that I am speaking with the correct person using two identifiers.  Location: Patient: HOME Provider: [facility] Persons participating in the virtual visit: patient/Nurse Health Advisor   I discussed the limitations, risks, security and privacy concerns of performing an evaluation and management service by telephone and the availability of in person appointments. The patient expressed understanding and agreed to proceed.  Interactive audio and video telecommunications were attempted between this nurse and patient, however failed, due to patient having technical difficulties OR patient did not have access to video capability.  We continued and completed visit with audio only.  Some vital signs may be absent or patient reported.   Darral Dash, LPN  Review of Systems     Cardiac Risk Factors include: advanced age (>75men, >54 women);hypertension;dyslipidemia;sedentary lifestyle;obesity (BMI >30kg/m2)     Objective:    Today's Vitals   07/31/21 0901 07/31/21 0903  Weight: 175 lb (79.4 kg)   Height: 5\' 2"  (1.575 m)   PainSc:  7    Body mass index is 32.01 kg/m.     07/31/2021    9:09 AM 07/25/2020    2:59 PM  Advanced Directives  Does Patient Have a Medical Advance Directive? No No  Would patient like information on creating a medical advance directive? No - Patient declined No - Patient declined    Current Medications (verified) Outpatient Encounter Medications as of 07/31/2021  Medication Sig   acetaZOLAMIDE (DIAMOX) 250 MG tablet Take 250 mg by mouth 2 (two) times daily.    albuterol (PROVENTIL) (2.5 MG/3ML) 0.083% nebulizer solution Take 3 mLs(1 VIAL) (2.5 mg total) by nebulization every 4 (four) hours as needed for wheezing or  shortness of breath.   albuterol (VENTOLIN HFA) 108 (90 Base) MCG/ACT inhaler Inhale 2 puffs into the lungs every 6 (six) hours as needed for wheezing or shortness of breath.   BOTOX 200 units SOLR    Budeson-Glycopyrrol-Formoterol (BREZTRI AEROSPHERE) 160-9-4.8 MCG/ACT AERO Inhale 2 puffs into the lungs in the morning and at bedtime.   budesonide (PULMICORT) 0.25 MG/2ML nebulizer solution Take 2 mLs (0.25 mg total) by nebulization 2 (two) times daily as needed (shortness of breath).   butalbital-acetaminophen-caffeine (FIORICET, ESGIC) 50-325-40 MG tablet    cetirizine (ZYRTEC) 10 MG tablet Take 10 mg by mouth daily.   Cholecalciferol (VITAMIN D) 2000 UNITS CAPS Take 5,000 Units by mouth daily.    cyanocobalamin (,VITAMIN B-12,) 1000 MCG/ML injection Inject 1,000 mcg as directed every 30 (thirty) days.   estradiol (ESTRACE) 0.5 MG tablet TAKE 1 TABLET BY MOUTH ONCE DAILY   meclizine (ANTIVERT) 12.5 MG tablet Take 1 tablet (12.5 mg total) by mouth 3 (three) times daily as needed for dizziness.   montelukast (SINGULAIR) 10 MG tablet Take 1 tablet (10 mg total) by mouth at bedtime.   omeprazole (PRILOSEC) 40 MG capsule Take 1 capsule (40 mg total) by mouth 2 (two) times daily.   prochlorperazine (COMPAZINE) 10 MG tablet Take 10 mg by mouth every 6 (six) hours as needed for nausea or vomiting.   promethazine (PHENERGAN) 12.5 MG tablet Take 12.5 mg by mouth every 8 (eight) hours as needed for nausea or vomiting.   rOPINIRole (REQUIP) 4 MG tablet Take 1 tablet (4 mg total) by mouth at  bedtime.   rosuvastatin (CRESTOR) 10 MG tablet Take 1 tablet (10 mg total) by mouth daily.   sertraline (ZOLOFT) 100 MG tablet Take 1 tablet (100 mg total) by mouth at bedtime.   traZODone (DESYREL) 150 MG tablet TAKE 1/3 (ONE-THIRD) to 1 (ONE) tablet BY MOUTH nightly as needed for sleep.   Ubrogepant (UBRELVY PO) Take by mouth.   [DISCONTINUED] cefPROZIL (CEFZIL) 500 MG tablet Take 1 tablet (500 mg total) by mouth 2  (two) times daily. For infection. Take all of this medication.   Facility-Administered Encounter Medications as of 07/31/2021  Medication   cyanocobalamin ((VITAMIN B-12)) injection 1,000 mcg    Allergies (verified) Pregabalin, Chlorzoxazone, Diclofenac, Metoclopramide, Sibutramine, Topiramate, Venlafaxine, Amitriptyline, Duloxetine hcl, Guaifenesin er, Oxybutynin, Gabapentin, and Propranolol   History: Past Medical History:  Diagnosis Date   Anxiety    Asthma    COPD (chronic obstructive pulmonary disease) (HCC)    Depression    Emphysema of lung (HCC)    GERD (gastroesophageal reflux disease)    Hyperlipidemia    Migraines    Osteoarthritis    Sleep apnea    Very mild,use for moderate airway collapse   Past Surgical History:  Procedure Laterality Date   ABDOMINAL HYSTERECTOMY     BRAIN SURGERY  05/2016   CERVICAL FUSION  01/20/2013   C4 - C7   RHINOPLASTY     SPINE SURGERY  2015   TUBAL LIGATION  1986   Family History  Problem Relation Age of Onset   Arthritis Mother    Asthma Mother    Heart disease Mother    Arthritis Father    Heart disease Father    Heart attack Father    Hypertension Sister    Social History   Socioeconomic History   Marital status: Married    Spouse name: Not on file   Number of children: 2   Years of education: Not on file   Highest education level: 12th grade  Occupational History   Occupation: Disabled  Tobacco Use   Smoking status: Never   Smokeless tobacco: Never  Substance and Sexual Activity   Alcohol use: No   Drug use: No   Sexual activity: Not on file  Other Topics Concern   Not on file  Social History Narrative   Not on file   Social Determinants of Health   Financial Resource Strain: Low Risk  (07/31/2021)   Overall Financial Resource Strain (CARDIA)    Difficulty of Paying Living Expenses: Not hard at all  Food Insecurity: No Food Insecurity (07/31/2021)   Hunger Vital Sign    Worried About Running Out of  Food in the Last Year: Never true    Ran Out of Food in the Last Year: Never true  Transportation Needs: No Transportation Needs (07/31/2021)   PRAPARE - Administrator, Civil ServiceTransportation    Lack of Transportation (Medical): No    Lack of Transportation (Non-Medical): No  Physical Activity: Inactive (07/31/2021)   Exercise Vital Sign    Days of Exercise per Week: 0 days    Minutes of Exercise per Session: 0 min  Stress: No Stress Concern Present (07/31/2021)   [employer] - Occupational Stress Questionnaire    Feeling of Stress : Not at all  Social Connections: Socially Integrated (07/31/2021)   Social Connection and Isolation Panel [NHANES]    Frequency of Communication with Friends and Family: More than three times a week    Frequency of Social Gatherings with Friends and Family:  Three times a week    Attends Religious Services: More than 4 times per year    Active Member of Clubs or Organizations: Yes    Attends Engineer, structural: More than 4 times per year    Marital Status: Married    Tobacco Counseling Counseling given: Not Answered   Clinical Intake:  Pre-visit preparation completed: Yes  Pain : 0-10 Pain Score: 7  Pain Type: Chronic pain Pain Location: Head Pain Descriptors / Indicators: Aching, Discomfort Pain Onset: More than a month ago Pain Frequency: Intermittent     BMI - recorded: 32.01 Nutritional Status: BMI > 30  Obese Nutritional Risks: None Diabetes: No  How often do you need to have someone help you when you read instructions, pamphlets, or other written materials from your doctor or pharmacy?: 1 - Never  Diabetic?NO  Interpreter Needed?: No  Information entered by :: [email or web address], lpn   Activities of Daily Living    07/31/2021    9:12 AM 07/31/2021    8:08 AM  In your present state of health, do you have any difficulty performing the following activities:  Hearing? 0 0  Vision? 0 0  Difficulty concentrating or making  decisions? 0 0  Walking or climbing stairs? 0   Dressing or bathing? 0 0  Doing errands, shopping? 0 0  Preparing Food and eating ? N N  Using the Toilet? N N  In the past six months, have you accidently leaked urine? Y Y  Do you have problems with loss of bowel control? N N  Managing your Medications? N N  Managing your Finances? N N  Housekeeping or managing your Housekeeping? N N    Patient Care Team: Mechele Claude, MD as PCP - General (Family Medicine) Gunnar Fusi, MD (Internal Medicine)  Indicate any recent Medical Services you may have received from other than [facility] providers in the past year (date may be approximate).     Assessment:   This is a routine wellness examination for [place].  Hearing/Vision screen Hearing Screening - Comments:: No hearing issues.  Vision Screening - Comments:: Glasses. Dr. Dan Humphreys in [place]23.  Dietary issues and exercise activities discussed: Current Exercise Habits: The patient does not participate in regular exercise at present, Exercise limited by: cardiac condition(s)   Goals Addressed             This Visit's Progress    DIET - EAT MORE FRUITS AND VEGETABLES   On track    Exercise 150 min/wk Moderate Activity   Not on track    07/31/2021-Pt states she has not been able to exercise due to back issues.       Depression Screen    07/31/2021    9:06 AM 07/09/2021    9:29 AM 05/08/2021   10:15 AM 01/23/2021   10:24 AM 10/09/2020   11:14 AM 10/09/2020   10:57 AM 07/25/2020    2:59 PM  PHQ 2/9 Scores  PHQ - 2 Score 0 0 0 0 0 0 0  PHQ- 9 Score     1      Fall Risk    07/31/2021    9:10 AM 07/31/2021    8:08 AM 07/09/2021    9:29 AM 05/08/2021   10:15 AM 01/23/2021   10:24 AM  Fall Risk   Falls in the past year? 0 0 0 0 0  Number falls in past yr: 0      Injury with Fall? 0  Risk for fall due to : No Fall Risks      Follow up Falls prevention discussed        FALL RISK PREVENTION PERTAINING TO THE HOME:  Any stairs in  or around the home? Yes  If so, are there any without handrails? No  Home free of loose throw rugs in walkways, pet beds, electrical cords, etc? No  Adequate lighting in your home to reduce risk of falls? No   ASSISTIVE DEVICES UTILIZED TO PREVENT FALLS:  Life alert? No  Use of a cane, walker or w/c? No  Grab bars in the bathroom? Yes  Shower chair or bench in shower? Yes  Elevated toilet seat or a handicapped toilet? No   TIMED UP AND GO:  Was the test performed? No .  Phone visit. Cognitive Function:        07/31/2021    9:12 AM 07/25/2020    3:00 PM  6CIT Screen  What Year? 0 points 0 points  What month? 0 points 0 points  What time? 0 points 0 points  Count back from 20 0 points 0 points  Months in reverse 0 points 0 points  Repeat phrase 0 points 0 points  Total Score 0 points 0 points    Immunizations Immunization History  Administered Date(s) Administered   Influenza,inj,Quad PF,6+ Mos 12/21/2014, 12/24/2015, 10/23/2016, 01/15/2018, 12/07/2018, 12/05/2020   Influenza,inj,quad, With Preservative 12/05/2020   Influenza-Unspecified 10/20/2012, 10/20/2013, 12/21/2014   PNEUMOCOCCAL CONJUGATE-20 12/05/2020   Pneumococcal Conjugate-13 12/21/2014   Td 01/21/2006    TDAP status: Due, Education has been provided regarding the importance of this vaccine. Advised may receive this vaccine at local pharmacy or Health Dept. Aware to provide a copy of the vaccination record if obtained from local pharmacy or Health Dept. Verbalized acceptance and understanding.  Flu Vaccine status: Up to date  Pneumococcal vaccine status: Up to date  Covid-19 vaccine status: Declined, Education has been provided regarding the importance of this vaccine but patient still declined. Advised may receive this vaccine at local pharmacy or Health Dept.or vaccine clinic. Aware to provide a copy of the vaccination record if obtained from local pharmacy or Health Dept. Verbalized acceptance and  understanding.  Qualifies for Shingles Vaccine? Yes   Zostavax completed No   Shingrix Completed?: No.    Education has been provided regarding the importance of this vaccine. Patient has been advised to call insurance company to determine out of pocket expense if they have not yet received this vaccine. Advised may also receive vaccine at local pharmacy or Health Dept. Verbalized acceptance and understanding.  Screening Tests Health Maintenance  Topic Date Due   Hepatitis C Screening  10/09/2021 (Originally 07/09/1978)   HIV Screening  10/09/2021 (Originally 07[birth year]09[birth year]1975)   MAMMOGRAM  01/17/2022 (Originally 06/28/2021)   COVID-19 Vaccine (1) 01/17/2022 (Originally 01/07/1961)   Zoster Vaccines- Shingrix (1 of 2) 01/17/2022 (Originally 07/09/2010)   INFLUENZA VACCINE  08/20/2021   COLONOSCOPY (Pts 45-86yrs Insurance coverage will need to be confirmed)  05/19/2023   TETANUS/TDAP  10/21/2023   HPV VACCINES  Aged Out   PAP SMEAR-Modifier  Discontinued    Health Maintenance  There are no preventive care reminders to display for this patient.   Colorectal cancer screening: Type of screening: Colonoscopy. Completed 05/18/2013. Repeat every 10 years  Mammogram status: Completed 06/28/2020. Repeat every year  Bone Density status: Ordered DUE AT AGE 67.. Pt provided with contact info and advised to call to schedule appt.  Lung Cancer Screening: (  Low Dose CT Chest recommended if Age 92-80 years, 30 pack-year currently smoking OR have quit w/in 15years.) does not qualify.    Additional Screening:  Hepatitis C Screening: does qualify; Completed DUE  Vision Screening: Recommended annual ophthalmology exams for early detection of glaucoma and other disorders of the eye. Is the patient up to date with their annual eye exam?  Yes  Who is the provider or what is the name of the office in which the patient attends annual eye exams? Dr. Andrey Campanile in [place] If pt is not established with a provider, would they  like to be referred to a provider to establish care? No .   Dental Screening: Recommended annual dental exams for proper oral hygiene  Community Resource Referral / Chronic Care Management: CRR required this visit?  No   CCM required this visit?  No      Plan:     I have personally reviewed and noted the following in the patient's chart:   Medical and social history Use of alcohol, tobacco or illicit drugs  Current medications and supplements including opioid prescriptions.  Functional ability and status Nutritional status Physical activity Advanced directives List of other physicians Hospitalizations, surgeries, and ER visits in previous 12 months Vitals Screenings to include cognitive, depression, and falls Referrals and appointments  In addition, I have reviewed and discussed with patient certain preventive protocols, quality metrics, and best practice recommendations. A written personalized care plan for preventive services as well as general preventive health recommendations were provided to patient.     Darral Dash, LPN   [id number]   Nurse Notes: Discussed Shingrix and how to obtain. Pt would like to defer mammogram at this time due to current issues with back.

## 2021-08-05 DIAGNOSIS — J471 Bronchiectasis with (acute) exacerbation: Secondary | ICD-10-CM | POA: Diagnosis not present

## 2021-08-05 DIAGNOSIS — G4733 Obstructive sleep apnea (adult) (pediatric): Secondary | ICD-10-CM | POA: Diagnosis not present

## 2021-08-05 DIAGNOSIS — J398 Other specified diseases of upper respiratory tract: Secondary | ICD-10-CM | POA: Diagnosis not present

## 2021-08-05 DIAGNOSIS — Z9989 Dependence on other enabling machines and devices: Secondary | ICD-10-CM | POA: Diagnosis not present

## 2021-08-08 DIAGNOSIS — M47816 Spondylosis without myelopathy or radiculopathy, lumbar region: Secondary | ICD-10-CM | POA: Diagnosis not present

## 2021-08-08 DIAGNOSIS — M7138 Other bursal cyst, other site: Secondary | ICD-10-CM | POA: Diagnosis not present

## 2021-08-08 DIAGNOSIS — Z9689 Presence of other specified functional implants: Secondary | ICD-10-CM | POA: Diagnosis not present

## 2021-08-08 DIAGNOSIS — M5126 Other intervertebral disc displacement, lumbar region: Secondary | ICD-10-CM | POA: Diagnosis not present

## 2021-08-08 DIAGNOSIS — G932 Benign intracranial hypertension: Secondary | ICD-10-CM | POA: Diagnosis not present

## 2021-08-08 DIAGNOSIS — M48061 Spinal stenosis, lumbar region without neurogenic claudication: Secondary | ICD-10-CM | POA: Diagnosis not present

## 2021-08-08 DIAGNOSIS — M5136 Other intervertebral disc degeneration, lumbar region: Secondary | ICD-10-CM | POA: Diagnosis not present

## 2021-08-08 DIAGNOSIS — M5135 Other intervertebral disc degeneration, thoracolumbar region: Secondary | ICD-10-CM | POA: Diagnosis not present

## 2021-08-09 ENCOUNTER — Other Ambulatory Visit: Payer: Self-pay | Admitting: Family Medicine

## 2021-08-09 DIAGNOSIS — N951 Menopausal and female climacteric states: Secondary | ICD-10-CM

## 2021-08-22 DIAGNOSIS — M5416 Radiculopathy, lumbar region: Secondary | ICD-10-CM | POA: Diagnosis not present

## 2021-08-22 DIAGNOSIS — M47816 Spondylosis without myelopathy or radiculopathy, lumbar region: Secondary | ICD-10-CM | POA: Diagnosis not present

## 2021-08-22 DIAGNOSIS — M47812 Spondylosis without myelopathy or radiculopathy, cervical region: Secondary | ICD-10-CM | POA: Diagnosis not present

## 2021-08-29 DIAGNOSIS — R6889 Other general symptoms and signs: Secondary | ICD-10-CM | POA: Diagnosis not present

## 2021-08-29 DIAGNOSIS — M47816 Spondylosis without myelopathy or radiculopathy, lumbar region: Secondary | ICD-10-CM | POA: Diagnosis not present

## 2021-09-06 DIAGNOSIS — M5416 Radiculopathy, lumbar region: Secondary | ICD-10-CM | POA: Diagnosis not present

## 2021-09-12 ENCOUNTER — Telehealth: Payer: Self-pay | Admitting: Family Medicine

## 2021-09-12 NOTE — Telephone Encounter (Signed)
Coming to be seen

## 2021-09-13 ENCOUNTER — Ambulatory Visit (INDEPENDENT_AMBULATORY_CARE_PROVIDER_SITE_OTHER): Payer: Medicare Other | Admitting: Family Medicine

## 2021-09-13 ENCOUNTER — Encounter: Payer: Self-pay | Admitting: Family Medicine

## 2021-09-13 VITALS — BP 112/67 | HR 76 | Temp 98.0°F | Ht 62.0 in | Wt 177.0 lb

## 2021-09-13 DIAGNOSIS — L0291 Cutaneous abscess, unspecified: Secondary | ICD-10-CM | POA: Diagnosis not present

## 2021-09-13 MED ORDER — SULFAMETHOXAZOLE-TRIMETHOPRIM 800-160 MG PO TABS: 1.0000 | ORAL_TABLET | Freq: Two times a day (BID) | ORAL | 0 refills | Status: AC

## 2021-09-13 NOTE — Progress Notes (Signed)
Subjective:  Patient ID: Amy Gray, female    DOB: 1961-01-19, 61 y.o.   MRN: [id number]  Patient Care Team: Claretta Fraise, MD as PCP - General (Family Medicine) Dyanne Carrel, MD (Internal Medicine)   Chief Complaint:  Rash   HPI: Amy Gray is a 61 y.o. female presenting on 09/13/2021 for Rash   Pt presents today with complaints of red, raised and draining bump to her nose. States this started several days ago. Did have purulent drainage yesterday. No known injuries. Does use CPAP at night and wears glasses daily.   Rash This is a new problem. The current episode started in the past 7 days. The affected locations include the face. The rash is characterized by redness, swelling and draining. She was exposed to nothing. Pertinent negatives include no anorexia, congestion, cough, diarrhea, eye pain, facial edema, fatigue, fever, joint pain, nail changes, rhinorrhea, shortness of breath, sore throat or vomiting. Past treatments include antibiotic cream. The treatment provided no relief.     Relevant past medical, surgical, family, and social history reviewed and updated as indicated.  Allergies and medications reviewed and updated. Data reviewed: Chart in Epic.   Past Medical History:  Diagnosis Date   Anxiety    Asthma    COPD (chronic obstructive pulmonary disease) (HCC)    Depression    Emphysema of lung (HCC)    GERD (gastroesophageal reflux disease)    Hyperlipidemia    Migraines    Osteoarthritis    Sleep apnea    Very mild,use for moderate airway collapse    Past Surgical History:  Procedure Laterality Date   ABDOMINAL HYSTERECTOMY     BRAIN SURGERY  05/2016   CERVICAL FUSION  01/20/2013   C4 - C7   RHINOPLASTY     SPINE SURGERY  2015   TUBAL LIGATION  1986    Social History   Socioeconomic History   Marital status: Married    Spouse name: Not on file   Number of children: 2   Years of education: Not on file   Highest education level:  12th grade  Occupational History   Occupation: Disabled  Tobacco Use   Smoking status: Never   Smokeless tobacco: Never  Substance and Sexual Activity   Alcohol use: No   Drug use: No   Sexual activity: Not on file  Other Topics Concern   Not on file  Social History Narrative   Not on file   Social Determinants of Health   Financial Resource Strain: Low Risk  (07/31/2021)   Overall Financial Resource Strain (CARDIA)    Difficulty of Paying Living Expenses: Not hard at all  Food Insecurity: No Food Insecurity (07/31/2021)   Hunger Vital Sign    Worried About Running Out of Food in the Last Year: Never true    Ran Out of Food in the Last Year: Never true  Transportation Needs: No Transportation Needs (07/31/2021)   PRAPARE - Hydrologist (Medical): No    Lack of Transportation (Non-Medical): No  Physical Activity: Inactive (07/31/2021)   Exercise Vital Sign    Days of Exercise per Week: 0 days    Minutes of Exercise per Session: 0 min  Stress: No Stress Concern Present (07/31/2021)   [place]    Feeling of Stress : Not at all  Social Connections: [place] (07/31/2021)   Social Connection and Isolation Panel [NHANES]  Frequency of Communication with Friends and Family: More than three times a week    Frequency of Social Gatherings with Friends and Family: Three times a week    Attends Religious Services: More than 4 times per year    Active Member of Clubs or Organizations: Yes    Attends Archivist Meetings: More than 4 times per year    Marital Status: Married  Human resources officer Violence: Not At Risk (07/31/2021)   Humiliation, Afraid, Rape, and Kick questionnaire    Fear of Current or Ex-Partner: No    Emotionally Abused: No    Physically Abused: No    Sexually Abused: No    Outpatient Encounter Medications as of 09/13/2021  Medication Sig   acetaZOLAMIDE  (DIAMOX) 250 MG tablet Take 250 mg by mouth 2 (two) times daily.    albuterol (PROVENTIL) (2.5 MG/3ML) 0.083% nebulizer solution Take 3 mLs(1 VIAL) (2.5 mg total) by nebulization every 4 (four) hours as needed for wheezing or shortness of breath.   albuterol (VENTOLIN HFA) 108 (90 Base) MCG/ACT inhaler Inhale 2 puffs into the lungs every 6 (six) hours as needed for wheezing or shortness of breath.   BOTOX 200 units SOLR    Budeson-Glycopyrrol-Formoterol (BREZTRI AEROSPHERE) 160-9-4.8 MCG/ACT AERO Inhale 2 puffs into the lungs in the morning and at bedtime.   budesonide (PULMICORT) 0.25 MG/2ML nebulizer solution Take 2 mLs (0.25 mg total) by nebulization 2 (two) times daily as needed (shortness of breath).   butalbital-acetaminophen-caffeine (FIORICET, ESGIC) 50-325-40 MG tablet    cetirizine (ZYRTEC) 10 MG tablet Take 10 mg by mouth daily.   Cholecalciferol (VITAMIN D) 2000 UNITS CAPS Take 5,000 Units by mouth daily.    cyanocobalamin (,VITAMIN B-12,) 1000 MCG/ML injection Inject 1,000 mcg as directed every 30 (thirty) days.   estradiol (ESTRACE) 0.5 MG tablet TAKE 1 TABLET BY MOUTH ONCE DAILY   meclizine (ANTIVERT) 12.5 MG tablet Take 1 tablet (12.5 mg total) by mouth 3 (three) times daily as needed for dizziness.   montelukast (SINGULAIR) 10 MG tablet Take 1 tablet (10 mg total) by mouth at bedtime.   omeprazole (PRILOSEC) 40 MG capsule Take 1 capsule (40 mg total) by mouth 2 (two) times daily.   prochlorperazine (COMPAZINE) 10 MG tablet Take 10 mg by mouth every 6 (six) hours as needed for nausea or vomiting.   promethazine (PHENERGAN) 12.5 MG tablet Take 12.5 mg by mouth every 8 (eight) hours as needed for nausea or vomiting.   rOPINIRole (REQUIP) 4 MG tablet Take 1 tablet (4 mg total) by mouth at bedtime.   rosuvastatin (CRESTOR) 10 MG tablet Take 1 tablet (10 mg total) by mouth daily.   sertraline (ZOLOFT) 100 MG tablet Take 1 tablet (100 mg total) by mouth at bedtime.    sulfamethoxazole-trimethoprim (BACTRIM DS) 800-160 MG tablet Take 1 tablet by mouth 2 (two) times daily for 7 days.   traZODone (DESYREL) 150 MG tablet TAKE 1/3 (ONE-THIRD) to 1 (ONE) tablet BY MOUTH nightly as needed for sleep.   Ubrogepant (UBRELVY PO) Take by mouth.   zonisamide (ZONEGRAN) 100 MG capsule Take 100 mg by mouth at bedtime.   Facility-Administered Encounter Medications as of 09/13/2021  Medication   cyanocobalamin ((VITAMIN B-12)) injection 1,000 mcg    Allergies  Allergen Reactions   Pregabalin Shortness Of Breath   Chlorzoxazone Other (See Comments)    Decrease in BP    Diclofenac Other (See Comments)    Other reaction(s): Other Other reaction(s): Other Update Update Update  Update Other reaction(s): Other Other reaction(s): Other Update Update Update Update    Metoclopramide Other (See Comments)    Restless legs   Sibutramine     Other reaction(s): Other update   Topiramate Other (See Comments)   Venlafaxine Other (See Comments)    Restless legs   Amitriptyline    Duloxetine Hcl    Guaifenesin Er    Oxybutynin    Gabapentin     Other reaction(s): Confusion   Propranolol Anxiety    Review of Systems  Constitutional:  Negative for activity change, appetite change, chills, diaphoresis, fatigue, fever and unexpected weight change.  HENT:  Negative for congestion, rhinorrhea and sore throat.   Eyes:  Negative for pain.  Respiratory:  Negative for cough, chest tightness and shortness of breath.   Gastrointestinal:  Negative for abdominal pain, anorexia, diarrhea and vomiting.  Genitourinary:  Negative for decreased urine volume and difficulty urinating.  Musculoskeletal:  Negative for joint pain.  Skin:  Positive for color change and rash. Negative for nail changes, pallor and wound.  Neurological:  Negative for weakness and headaches.  Psychiatric/Behavioral:  Negative for confusion.   All other systems reviewed and are negative.        Objective:  BP 112/67   Pulse 76   Temp 98 F (36.7 C)   Ht _0  (1.575 m)   Wt 177 lb (80.3 kg)   SpO2 94%   BMI 32.37 kg/m    Wt Readings from Last 3 Encounters:  09/13/21 177 lb (80.3 kg)  07/31/21 175 lb (79.4 kg)  07/09/21 175 lb (79.4 kg)    Physical Exam Vitals and nursing note reviewed.  Constitutional:      General: She is not in acute distress.    Appearance: Normal appearance. She is obese. She is not ill-appearing, toxic-appearing or diaphoretic.  HENT:     Head: Normocephalic and atraumatic.     Mouth/Throat:     Mouth: Mucous membranes are moist.     Pharynx: Oropharynx is clear.  Eyes:     Conjunctiva/sclera: Conjunctivae normal.     Pupils: Pupils are equal, round, and reactive to light.  Cardiovascular:     Rate and Rhythm: Normal rate and regular rhythm.     Heart sounds: Normal heart sounds.  Pulmonary:     Effort: Pulmonary effort is normal.     Breath sounds: Normal breath sounds. No wheezing, rhonchi or rales.  Musculoskeletal:     Right lower leg: No edema.     Left lower leg: No edema.  Skin:    General: Skin is warm and dry.     Capillary Refill: Capillary refill takes less than 2 seconds.     Findings: Abscess and erythema present.       Neurological:     General: No focal deficit present.     Mental Status: She is alert and oriented to person, place, and time.  Psychiatric:        Mood and Affect: Mood normal.        Behavior: Behavior normal.        Thought Content: Thought content normal.        Judgment: Judgment normal.     Results for orders placed or performed in visit on 05/08/21  CBC with Differential/Platelet  Result Value Ref Range   WBC 7.0 3.4 - 10.8 x10E3/uL   RBC 4.43 3.77 - 5.28 x10E6/uL   Hemoglobin 13.2 11.1 - 15.9 g/dL   Hematocrit 39.3  34.0 - 46.6 %   MCV 89 79 - 97 fL   MCH 29.8 26.6 - 33.0 pg   MCHC 33.6 31.5 - 35.7 g/dL   RDW 13.3 11.7 - 15.4 %   Platelets 319 150 - 450 x10E3/uL   Neutrophils 53  Not Estab. %   Lymphs 33 Not Estab. %   Monocytes 10 Not Estab. %   Eos 1 Not Estab. %   Basos 1 Not Estab. %   Neutrophils Absolute 3.7 1.4 - 7.0 x10E3/uL   Lymphocytes Absolute 2.3 0.7 - 3.1 x10E3/uL   Monocytes Absolute 0.7 0.1 - 0.9 x10E3/uL   EOS (ABSOLUTE) 0.1 0.0 - 0.4 x10E3/uL   Basophils Absolute 0.1 0.0 - 0.2 x10E3/uL   Immature Granulocytes 2 Not Estab. %   Immature Grans (Abs) 0.1 0.0 - 0.1 x10E3/uL  CMP14+EGFR  Result Value Ref Range   Glucose 94 70 - 99 mg/dL   BUN 16 8 - 27 mg/dL   Creatinine, Ser 1.02 (H) 0.57 - 1.00 mg/dL   eGFR 63 >59 mL/min/1.73   BUN/Creatinine Ratio 16 12 - 28   Sodium 143 134 - 144 mmol/L   Potassium 3.9 3.5 - 5.2 mmol/L   Chloride 110 (H) 96 - 106 mmol/L   CO2 20 20 - 29 mmol/L   Calcium 9.0 8.7 - 10.3 mg/dL   Total Protein 6.6 6.0 - 8.5 g/dL   Albumin 4.3 3.8 - 4.9 g/dL   Globulin, Total 2.3 1.5 - 4.5 g/dL   Albumin/Globulin Ratio 1.9 1.2 - 2.2   Bilirubin Total 0.2 0.0 - 1.2 mg/dL   Alkaline Phosphatase 115 44 - 121 IU/L   AST 15 0 - 40 IU/L   ALT 17 0 - 32 IU/L  Lipid panel  Result Value Ref Range   Cholesterol, Total 192 100 - 199 mg/dL   Triglycerides 93 0 - 149 mg/dL   HDL 58 >39 mg/dL   VLDL Cholesterol Cal 17 5 - 40 mg/dL   LDL Chol Calc (NIH) 117 (H) 0 - 99 mg/dL   Chol/HDL Ratio 3.3 0.0 - 4.4 ratio  VITAMIN D 25 Hydroxy (Vit-D Deficiency, Fractures)  Result Value Ref Range   Vit D, 25-Hydroxy 87.5 30.0 - 100.0 ng/mL  Vitamin B12  Result Value Ref Range   Vitamin B-12 897 232 - 1,245 pg/mL       Pertinent labs & imaging results that were available during my care of the patient were reviewed by me and considered in my medical decision making.  Assessment & Plan:  Amy Gray was seen today for rash.  Diagnoses and all orders for this visit:  Abscess Abscess to left nasal bridge. Open and draining. Will place on bactrim. Wound care discussed in detail. Report new, worsening, or persistent symptoms.  -      sulfamethoxazole-trimethoprim (BACTRIM DS) 800-160 MG tablet; Take 1 tablet by mouth 2 (two) times daily for 7 days.     Continue all other maintenance medications.  Follow up plan: Return if symptoms worsen or fail to improve.   Continue healthy lifestyle choices, including diet (rich in fruits, vegetables, and lean proteins, and low in salt and simple carbohydrates) and exercise (at least 30 minutes of moderate physical activity daily).  Educational handout given for abscess   The above assessment and management plan was discussed with the patient. The patient verbalized understanding of and has agreed to the management plan. Patient is aware to call the clinic if they develop any new symptoms or  if symptoms persist or worsen. Patient is aware when to return to the clinic for a follow-up visit. Patient educated on when it is appropriate to go to the emergency department.   Monia Pouch, FNP-C [facility] [phone]

## 2021-09-18 DIAGNOSIS — G43719 Chronic migraine without aura, intractable, without status migrainosus: Secondary | ICD-10-CM | POA: Diagnosis not present

## 2021-09-21 ENCOUNTER — Other Ambulatory Visit: Payer: Self-pay | Admitting: Family Medicine

## 2021-09-21 DIAGNOSIS — E782 Mixed hyperlipidemia: Secondary | ICD-10-CM

## 2021-09-26 ENCOUNTER — Other Ambulatory Visit: Payer: Self-pay | Admitting: Family Medicine

## 2021-09-26 DIAGNOSIS — K2100 Gastro-esophageal reflux disease with esophagitis, without bleeding: Secondary | ICD-10-CM

## 2021-10-12 ENCOUNTER — Other Ambulatory Visit: Payer: Self-pay | Admitting: Family Medicine

## 2021-10-12 DIAGNOSIS — G2581 Restless legs syndrome: Secondary | ICD-10-CM

## 2021-10-12 DIAGNOSIS — M797 Fibromyalgia: Secondary | ICD-10-CM

## 2021-10-12 DIAGNOSIS — J45909 Unspecified asthma, uncomplicated: Secondary | ICD-10-CM

## 2021-10-14 ENCOUNTER — Other Ambulatory Visit: Payer: Self-pay | Admitting: Family Medicine

## 2021-10-14 DIAGNOSIS — J45909 Unspecified asthma, uncomplicated: Secondary | ICD-10-CM

## 2021-10-14 DIAGNOSIS — M797 Fibromyalgia: Secondary | ICD-10-CM

## 2021-10-14 DIAGNOSIS — G2581 Restless legs syndrome: Secondary | ICD-10-CM

## 2021-10-25 ENCOUNTER — Ambulatory Visit (INDEPENDENT_AMBULATORY_CARE_PROVIDER_SITE_OTHER): Payer: Medicare Other | Admitting: Family Medicine

## 2021-10-25 ENCOUNTER — Encounter: Payer: Self-pay | Admitting: Family Medicine

## 2021-10-25 VITALS — BP 94/62 | HR 90 | Temp 98.0°F | Ht 62.0 in | Wt 177.4 lb

## 2021-10-25 DIAGNOSIS — J471 Bronchiectasis with (acute) exacerbation: Secondary | ICD-10-CM

## 2021-10-25 DIAGNOSIS — J069 Acute upper respiratory infection, unspecified: Secondary | ICD-10-CM | POA: Diagnosis not present

## 2021-10-25 MED ORDER — METHYLPREDNISOLONE ACETATE 80 MG/ML IJ SUSP
80.0000 mg | Freq: Once | INTRAMUSCULAR | Status: AC
  Administered 2021-10-25: 80 mg via INTRAMUSCULAR

## 2021-10-25 MED ORDER — PREDNISONE 20 MG PO TABS: ORAL_TABLET | ORAL | 0 refills | Status: DC

## 2021-10-25 MED ORDER — HYDROCODONE BIT-HOMATROP MBR 5-1.5 MG/5ML PO SOLN: 5.0000 mL | Freq: Three times a day (TID) | ORAL | 0 refills | Status: DC | PRN

## 2021-10-25 MED ORDER — DOXYCYCLINE HYCLATE 100 MG PO TABS: 100.0000 mg | ORAL_TABLET | Freq: Two times a day (BID) | ORAL | 0 refills | Status: AC

## 2021-10-25 NOTE — Progress Notes (Signed)
Acute Office Visit  Subjective:     Patient ID: Amy Gray, female    DOB: April 07, 1960, 61 y.o.   MRN: [id number]  Chief Complaint  Patient presents with   Cough   Shortness of Breath   Nasal Congestion    Cough This is a new problem. Episode onset: 10 days. The problem has been gradually worsening. The problem occurs every few minutes. The cough is Non-productive. Associated symptoms include ear pain (intermittent, left), headaches, myalgias, nasal congestion, postnasal drip, a sore throat, shortness of breath and wheezing (intermittent). Pertinent negatives include no chest pain, chills, fever or sweats. Associated symptoms comments: Frontal headache. The symptoms are aggravated by lying down. Risk factors for lung disease include smoking/tobacco exposure. She has tried rest, steroid inhaler, a beta-agonist inhaler and OTC cough suppressant for the symptoms. The treatment provided mild relief. Her past medical history is significant for asthma, bronchiectasis, environmental allergies and pneumonia. There is no history of bronchitis, COPD or emphysema.  She has been using albuterol inhaler BID. Has not had to use nebulizer.   Review of Systems  Constitutional:  Negative for chills and fever.  HENT:  Positive for ear pain (intermittent, left), postnasal drip and sore throat.   Respiratory:  Positive for shortness of breath and wheezing (intermittent).   Cardiovascular:  Negative for chest pain.  Musculoskeletal:  Positive for myalgias.  Neurological:  Positive for headaches.  Endo/Heme/Allergies:  Positive for environmental allergies.        Objective:    There were no vitals taken for this visit.   Physical Exam Vitals and nursing note reviewed.  Constitutional:      General: She is not in acute distress.    Appearance: She is not ill-appearing, toxic-appearing or diaphoretic.  HENT:     Head: Normocephalic and atraumatic.     Mouth/Throat:     Mouth: Mucous  membranes are moist.     Pharynx: No pharyngeal swelling or oropharyngeal exudate.  Cardiovascular:     Rate and Rhythm: Normal rate and regular rhythm.  Pulmonary:     Effort: Pulmonary effort is normal. No tachypnea, accessory muscle usage or respiratory distress.     Breath sounds: Examination of the right-lower field reveals wheezing. Examination of the left-lower field reveals wheezing. Wheezing present. No decreased breath sounds, rhonchi or rales.  Musculoskeletal:     Right lower leg: No edema.     Left lower leg: No edema.  Skin:    General: Skin is warm and dry.  Neurological:     General: No focal deficit present.     Mental Status: She is alert and oriented to person, place, and time.  Psychiatric:        Mood and Affect: Mood normal.        Behavior: Behavior normal.     No results found for any visits on 10/25/21.      Assessment & Plan:   Amy Gray was seen today for cough, shortness of breath and nasal congestion.  Diagnoses and all orders for this visit:  Bronchiectasis with (acute) exacerbation ([place]) -     predniSONE (DELTASONE) 20 MG tablet; Take 1 tablet by mouth with breakfast x 3 days. Start tomorrow -     methylPREDNISolone acetate (DEPO-MEDROL) injection 80 mg -     HYDROcodone bit-homatropine (HYCODAN) 5-1.5 MG/5ML syrup; Take 5 mLs by mouth every 8 (eight) hours as needed for cough.  Acute URI -     doxycycline (VIBRA-TABS) 100 MG tablet;  Take 1 tablet (100 mg total) by mouth 2 (two) times daily for 7 days. 1 po bid  URI symptoms x 10 days with increasing cough, shortness of breath, and wheezing. Will cover empirically for bacterial infection with doxycycline. Will treat acute exacerbation with steroid IM injection today followed by prednisone burst tomorrow. Continue inhaler regimen, albuterol prn. Discussed symptomatic care and return precautions.   Return to office for new or worsening symptoms, or if symptoms persist.   The patient indicates  understanding of these issues and agrees with the plan.  Gabriel Earing, FNP

## 2021-10-30 DIAGNOSIS — J398 Other specified diseases of upper respiratory tract: Secondary | ICD-10-CM | POA: Diagnosis not present

## 2021-10-30 DIAGNOSIS — J310 Chronic rhinitis: Secondary | ICD-10-CM | POA: Diagnosis not present

## 2021-10-30 DIAGNOSIS — G4730 Sleep apnea, unspecified: Secondary | ICD-10-CM | POA: Diagnosis not present

## 2021-10-30 DIAGNOSIS — J4550 Severe persistent asthma, uncomplicated: Secondary | ICD-10-CM | POA: Diagnosis not present

## 2021-11-02 ENCOUNTER — Other Ambulatory Visit: Payer: Self-pay | Admitting: Family

## 2021-11-07 ENCOUNTER — Ambulatory Visit (INDEPENDENT_AMBULATORY_CARE_PROVIDER_SITE_OTHER): Payer: Medicare Other | Admitting: Family Medicine

## 2021-11-07 ENCOUNTER — Encounter: Payer: Self-pay | Admitting: Family Medicine

## 2021-11-07 VITALS — BP 105/67 | HR 84 | Temp 98.1°F | Ht 62.0 in | Wt 177.0 lb

## 2021-11-07 DIAGNOSIS — R252 Cramp and spasm: Secondary | ICD-10-CM

## 2021-11-07 DIAGNOSIS — Z0001 Encounter for general adult medical examination with abnormal findings: Secondary | ICD-10-CM

## 2021-11-07 DIAGNOSIS — M797 Fibromyalgia: Secondary | ICD-10-CM

## 2021-11-07 DIAGNOSIS — E538 Deficiency of other specified B group vitamins: Secondary | ICD-10-CM

## 2021-11-07 DIAGNOSIS — F99 Mental disorder, not otherwise specified: Secondary | ICD-10-CM

## 2021-11-07 DIAGNOSIS — E559 Vitamin D deficiency, unspecified: Secondary | ICD-10-CM | POA: Diagnosis not present

## 2021-11-07 DIAGNOSIS — U071 COVID-19: Secondary | ICD-10-CM

## 2021-11-07 DIAGNOSIS — J1282 Pneumonia due to coronavirus disease 2019: Secondary | ICD-10-CM

## 2021-11-07 DIAGNOSIS — F5105 Insomnia due to other mental disorder: Secondary | ICD-10-CM | POA: Diagnosis not present

## 2021-11-07 DIAGNOSIS — F411 Generalized anxiety disorder: Secondary | ICD-10-CM

## 2021-11-07 DIAGNOSIS — Z0000 Encounter for general adult medical examination without abnormal findings: Secondary | ICD-10-CM | POA: Diagnosis not present

## 2021-11-07 DIAGNOSIS — N951 Menopausal and female climacteric states: Secondary | ICD-10-CM

## 2021-11-07 DIAGNOSIS — R5383 Other fatigue: Secondary | ICD-10-CM

## 2021-11-07 LAB — BAYER DCA HB A1C WAIVED: HB A1C (BAYER DCA - WAIVED): 5.4 % (ref 4.8–5.6)

## 2021-11-07 LAB — URINALYSIS
Bilirubin, UA: NEGATIVE
Glucose, UA: NEGATIVE
Ketones, UA: NEGATIVE
Leukocytes,UA: NEGATIVE
Nitrite, UA: NEGATIVE
Protein,UA: NEGATIVE
RBC, UA: NEGATIVE
Specific Gravity, UA: 1.02 (ref 1.005–1.030)
Urobilinogen, Ur: 1 mg/dL (ref 0.2–1.0)
pH, UA: 7 (ref 5.0–7.5)

## 2021-11-07 MED ORDER — SERTRALINE HCL 100 MG PO TABS: 100.0000 mg | ORAL_TABLET | Freq: Every day | ORAL | 3 refills | Status: DC

## 2021-11-07 MED ORDER — ALBUTEROL SULFATE HFA 108 (90 BASE) MCG/ACT IN AERS: 2.0000 | INHALATION_SPRAY | Freq: Four times a day (QID) | RESPIRATORY_TRACT | 11 refills | Status: DC | PRN

## 2021-11-07 MED ORDER — TRAZODONE HCL 150 MG PO TABS: ORAL_TABLET | ORAL | 3 refills | Status: DC

## 2021-11-07 MED ORDER — BUDESONIDE 0.25 MG/2ML IN SUSP: 0.2500 mg | Freq: Two times a day (BID) | RESPIRATORY_TRACT | 12 refills | Status: AC | PRN

## 2021-11-07 MED ORDER — ESTRADIOL 0.5 MG PO TABS: 0.5000 mg | ORAL_TABLET | Freq: Every day | ORAL | 3 refills | Status: DC

## 2021-11-07 NOTE — Progress Notes (Signed)
Subjective:  Patient ID: Amy Gray, female    DOB: [birth year]  Age: 61 y.o. MRN: [id number]  CC: Annual Exam  Annual exam.  HPI [name] presents for Annual exam.  MIgraines off and on. Has resp. Congestion. Makes her tired. Frequent cough. Considering biologic with her asthma MD.  Using diamox for intracranial HTN      11/07/2021    1:16 PM 10/25/2021    9:46 AM 09/13/2021    8:43 AM  Depression screen PHQ 2/9  Decreased Interest 0 0 0  Down, Depressed, Hopeless 0 0 0  PHQ - 2 Score 0 0 0  Altered sleeping 0 0 0  Tired, decreased energy 0 0 0  Change in appetite 0 0 0  Feeling bad or failure about yourself  0 0 0  Trouble concentrating 0 0 0  Moving slowly or fidgety/restless 0 0 0  Suicidal thoughts 0 0 0  PHQ-9 Score 0 0 0  Difficult doing work/chores  Not difficult at all Not difficult at all    History Amy Gray has a past medical history of Anxiety, Asthma, COPD (chronic obstructive pulmonary disease) ([place]), Depression, Emphysema of lung ([place]), GERD (gastroesophageal reflux disease), Hyperlipidemia, Migraines, Osteoarthritis, and Sleep apnea.   She has a past surgical history that includes Cervical fusion (01/20/2013); Rhinoplasty; Abdominal hysterectomy; Brain surgery (05/2016); Spine surgery (2015); and Tubal ligation (1986).   Her family history includes Arthritis in her father and mother; Asthma in her mother; Heart attack in her father; Heart disease in her father and mother; Hypertension in her sister.She reports that she has never smoked. She has never used smokeless tobacco. She reports that she does not drink alcohol and does not use drugs.    ROS Review of Systems  Constitutional: Negative.   HENT: Negative.    Eyes:  Negative for visual disturbance.  Respiratory:  Negative for shortness of breath.   Cardiovascular:  Negative for chest pain.  Gastrointestinal:  Negative for abdominal pain.  Musculoskeletal:  Negative for arthralgias.     Objective:  BP 105/67   Pulse 84   Temp 98.1 F (36.7 C)   Ht '5\' 2"'  (1.575 m)   Wt 177 lb (80.3 kg)   SpO2 95%   BMI 32.37 kg/m   BP Readings from Last 3 Encounters:  11/07/21 105/67  10/25/21 94/62  09/13/21 112/67    Wt Readings from Last 3 Encounters:  11/07/21 177 lb (80.3 kg)  10/25/21 177 lb 6 oz (80.5 kg)  09/13/21 177 lb (80.3 kg)     Physical Exam Constitutional:      General: She is not in acute distress.    Appearance: She is well-developed.  Cardiovascular:     Rate and Rhythm: Normal rate and regular rhythm.  Pulmonary:     Breath sounds: Normal breath sounds.  Musculoskeletal:        General: Normal range of motion.  Skin:    General: Skin is warm and dry.  Neurological:     Mental Status: She is alert and oriented to person, place, and time.       Assessment & Plan:   Mykel was seen today for annual exam.  Diagnoses and all orders for this visit:  Well adult exam -     CBC with Differential/Platelet -     CMP14+EGFR -     Lipid panel -     Urinalysis  Fibromyalgia syndrome -     traZODone (DESYREL) 150 MG tablet; TAKE  1/3 (ONE-THIRD) to 1 (ONE) tablet BY MOUTH nightly as needed for sleep.  Insomnia due to other mental disorder -     traZODone (DESYREL) 150 MG tablet; TAKE 1/3 (ONE-THIRD) to 1 (ONE) tablet BY MOUTH nightly as needed for sleep.  Generalized anxiety disorder -     sertraline (ZOLOFT) 100 MG tablet; Take 1 tablet (100 mg total) by mouth at bedtime.  Hot flashes due to menopause -     estradiol (ESTRACE) 0.5 MG tablet; Take 1 tablet (0.5 mg total) by mouth daily.  Pneumonia due to COVID-19 virus -     budesonide (PULMICORT) 0.25 MG/2ML nebulizer solution; Take 2 mLs (0.25 mg total) by nebulization 2 (two) times daily as needed (shortness of breath). -     albuterol (VENTOLIN HFA) 108 (90 Base) MCG/ACT inhaler; Inhale 2 puffs into the lungs every 6 (six) hours as needed for wheezing or shortness of  breath.  Fatigue, unspecified type -     TSH -     Bayer DCA Hb A1c Waived  Muscle cramping -     Magnesium -     Phosphorus  Vitamin B12 deficiency -     Vitamin B12  Vitamin D deficiency -     VITAMIN D 25 Hydroxy (Vit-D Deficiency, Fractures)   Await results of upcoming treatment with Asthma MD prior to assessing.    I have changed Floretta Garverick's estradiol. I am also having her maintain her Vitamin D, acetaZOLAMIDE, Botox, butalbital-acetaminophen-caffeine, Ubrogepant (UBRELVY PO), cetirizine, promethazine, prochlorperazine, meclizine, cyanocobalamin, Breztri Aerosphere, rosuvastatin, zonisamide, omeprazole, rOPINIRole, montelukast, baclofen, predniSONE, HYDROcodone bit-homatropine, albuterol, traZODone, sertraline, budesonide, and albuterol. We will continue to administer cyanocobalamin.  Allergies as of 11/07/2021       Reactions   Pregabalin Shortness Of Breath   Chlorzoxazone Other (See Comments)   Decrease in BP    Diclofenac Other (See Comments)   Other reaction(s): Other Other reaction(s): Other Update Update Update Update Other reaction(s): Other Other reaction(s): Other Update Update Update Update   Metoclopramide Other (See Comments)   Restless legs   Sibutramine    Other reaction(s): Other update   Topiramate Other (See Comments)   Venlafaxine Other (See Comments)   Restless legs   Amitriptyline    Duloxetine Hcl    Guaifenesin Er    Oxybutynin    Gabapentin    Other reaction(s): Confusion   Propranolol Anxiety        Medication List        Accurate as of November 07, 2021  2:01 PM. If you have any questions, ask your nurse or doctor.          acetaZOLAMIDE 250 MG tablet Commonly known as: DIAMOX Take 250 mg by mouth 2 (two) times daily.   albuterol (2.5 MG/3ML) 0.083% nebulizer solution Commonly known as: PROVENTIL Take 3 mLs(1 VIAL) (2.5 mg total) by nebulization every 4 (four) hours as needed for wheezing or shortness of  breath.   albuterol 108 (90 Base) MCG/ACT inhaler Commonly known as: VENTOLIN HFA Inhale 2 puffs into the lungs every 6 (six) hours as needed for wheezing or shortness of breath.   baclofen 10 MG tablet Commonly known as: LIORESAL Take 10 mg by mouth 3 (three) times daily.   Botox 200 units injection Generic drug: botulinum toxin Type A   Breztri Aerosphere 160-9-4.8 MCG/ACT Aero Generic drug: Budeson-Glycopyrrol-Formoterol Inhale 2 puffs into the lungs in the morning and at bedtime.   budesonide 0.25 MG/2ML nebulizer solution Commonly known as:  PULMICORT Take 2 mLs (0.25 mg total) by nebulization 2 (two) times daily as needed (shortness of breath).   butalbital-acetaminophen-caffeine 50-325-40 MG tablet Commonly known as: FIORICET   cetirizine 10 MG tablet Commonly known as: ZYRTEC Take 10 mg by mouth daily.   cyanocobalamin 1000 MCG/ML injection Commonly known as: VITAMIN B12 Inject 1,000 mcg as directed every 30 (thirty) days.   estradiol 0.5 MG tablet Commonly known as: ESTRACE Take 1 tablet (0.5 mg total) by mouth daily.   HYDROcodone bit-homatropine 5-1.5 MG/5ML syrup Commonly known as: HYCODAN Take 5 mLs by mouth every 8 (eight) hours as needed for cough.   meclizine 12.5 MG tablet Commonly known as: ANTIVERT Take 1 tablet (12.5 mg total) by mouth 3 (three) times daily as needed for dizziness.   montelukast 10 MG tablet Commonly known as: SINGULAIR Take 1 tablet (10 mg total) by mouth at bedtime.   omeprazole 40 MG capsule Commonly known as: PRILOSEC Take 1 capsule (40 mg total) by mouth 2 (two) times daily.   predniSONE 20 MG tablet Commonly known as: DELTASONE Take 1 tablet by mouth with breakfast x 3 days. Start tomorrow   prochlorperazine 10 MG tablet Commonly known as: COMPAZINE Take 10 mg by mouth every 6 (six) hours as needed for nausea or vomiting.   promethazine 12.5 MG tablet Commonly known as: PHENERGAN Take 12.5 mg by mouth every 8  (eight) hours as needed for nausea or vomiting.   rOPINIRole 4 MG tablet Commonly known as: REQUIP Take 1 tablet (4 mg total) by mouth at bedtime.   rosuvastatin 10 MG tablet Commonly known as: CRESTOR Take 1 tablet (10 mg total) by mouth daily.   sertraline 100 MG tablet Commonly known as: ZOLOFT Take 1 tablet (100 mg total) by mouth at bedtime.   traZODone 150 MG tablet Commonly known as: DESYREL TAKE 1/3 (ONE-THIRD) to 1 (ONE) tablet BY MOUTH nightly as needed for sleep.   UBRELVY PO Take by mouth.   Vitamin D 50 MCG (2000 UT) Caps Take 5,000 Units by mouth daily.   zonisamide 100 MG capsule Commonly known as: ZONEGRAN Take 100 mg by mouth at bedtime.         Follow-up: No follow-ups on file.  Claretta Fraise, M.D.

## 2021-11-08 LAB — CBC WITH DIFFERENTIAL/PLATELET
Basophils Absolute: 0 10*3/uL (ref 0.0–0.2)
Basos: 0 %
EOS (ABSOLUTE): 0 10*3/uL (ref 0.0–0.4)
Eos: 0 %
Hematocrit: 41.4 % (ref 34.0–46.6)
Hemoglobin: 13.6 g/dL (ref 11.1–15.9)
Immature Grans (Abs): 0.1 10*3/uL (ref 0.0–0.1)
Immature Granulocytes: 1 %
Lymphocytes Absolute: 1.6 10*3/uL (ref 0.7–3.1)
Lymphs: 15 %
MCH: 30.6 pg (ref 26.6–33.0)
MCHC: 32.9 g/dL (ref 31.5–35.7)
MCV: 93 fL (ref 79–97)
Monocytes Absolute: 0.8 10*3/uL (ref 0.1–0.9)
Monocytes: 7 %
Neutrophils Absolute: 8 10*3/uL — ABNORMAL HIGH (ref 1.4–7.0)
Neutrophils: 77 %
Platelets: 295 10*3/uL (ref 150–450)
RBC: 4.45 x10E6/uL (ref 3.77–5.28)
RDW: 13.1 % (ref 11.7–15.4)
WBC: 10.5 10*3/uL (ref 3.4–10.8)

## 2021-11-08 LAB — LIPID PANEL
Chol/HDL Ratio: 2.1 ratio (ref 0.0–4.4)
Cholesterol, Total: 186 mg/dL (ref 100–199)
HDL: 87 mg/dL (ref >39)
LDL Chol Calc (NIH): 78 mg/dL (ref 0–99)
Triglycerides: 125 mg/dL (ref 0–149)
VLDL Cholesterol Cal: 21 mg/dL (ref 5–40)

## 2021-11-08 LAB — CMP14+EGFR
ALT: 23 IU/L (ref 0–32)
AST: 17 IU/L (ref 0–40)
Albumin/Globulin Ratio: 2 (ref 1.2–2.2)
Albumin: 4.3 g/dL (ref 3.9–4.9)
Alkaline Phosphatase: 110 IU/L (ref 44–121)
BUN/Creatinine Ratio: 23 (ref 12–28)
BUN: 21 mg/dL (ref 8–27)
Bilirubin Total: <0.2 mg/dL (ref 0.0–1.2)
CO2: 20 mmol/L (ref 20–29)
Calcium: 8.9 mg/dL (ref 8.7–10.3)
Chloride: 106 mmol/L (ref 96–106)
Creatinine, Ser: 0.92 mg/dL (ref 0.57–1.00)
Globulin, Total: 2.2 g/dL (ref 1.5–4.5)
Glucose: 92 mg/dL (ref 70–99)
Potassium: 4.2 mmol/L (ref 3.5–5.2)
Sodium: 143 mmol/L (ref 134–144)
Total Protein: 6.5 g/dL (ref 6.0–8.5)
eGFR: 71 mL/min/{1.73_m2} (ref >59)

## 2021-11-08 LAB — VITAMIN B12: Vitamin B-12: 529 pg/mL (ref 232–1245)

## 2021-11-08 LAB — VITAMIN D 25 HYDROXY (VIT D DEFICIENCY, FRACTURES): Vit D, 25-Hydroxy: 45.8 ng/mL (ref 30.0–100.0)

## 2021-11-08 LAB — MAGNESIUM: Magnesium: 2.3 mg/dL (ref 1.6–2.3)

## 2021-11-08 LAB — TSH: TSH: 1.54 u[IU]/mL (ref 0.450–4.500)

## 2021-11-08 LAB — PHOSPHORUS: Phosphorus: 3.9 mg/dL (ref 3.0–4.3)

## 2021-11-09 NOTE — Progress Notes (Signed)
Hello Amy Gray,  Your lab result is normal and/or stable.Some minor variations that are not significant are commonly marked abnormal, but do not represent any medical problem for you.  Best regards, Claretta Fraise, M.D.

## 2021-11-11 ENCOUNTER — Other Ambulatory Visit: Payer: Self-pay | Admitting: *Deleted

## 2021-11-11 ENCOUNTER — Telehealth: Payer: Self-pay | Admitting: Family Medicine

## 2021-11-11 ENCOUNTER — Other Ambulatory Visit (INDEPENDENT_AMBULATORY_CARE_PROVIDER_SITE_OTHER): Payer: Medicare Other

## 2021-11-11 DIAGNOSIS — R059 Cough, unspecified: Secondary | ICD-10-CM | POA: Diagnosis not present

## 2021-11-11 DIAGNOSIS — J069 Acute upper respiratory infection, unspecified: Secondary | ICD-10-CM

## 2021-11-11 NOTE — Progress Notes (Signed)
Your chest x-ray looked normal. Thanks, WS.

## 2021-11-11 NOTE — Telephone Encounter (Signed)
Patient aware.

## 2021-11-11 NOTE — Telephone Encounter (Signed)
Sure, please order & I will review

## 2021-11-14 DIAGNOSIS — M5416 Radiculopathy, lumbar region: Secondary | ICD-10-CM | POA: Diagnosis not present

## 2021-11-14 DIAGNOSIS — M47816 Spondylosis without myelopathy or radiculopathy, lumbar region: Secondary | ICD-10-CM | POA: Diagnosis not present

## 2021-12-05 ENCOUNTER — Other Ambulatory Visit: Payer: Self-pay | Admitting: Family Medicine

## 2021-12-05 DIAGNOSIS — K2100 Gastro-esophageal reflux disease with esophagitis, without bleeding: Secondary | ICD-10-CM

## 2021-12-06 DIAGNOSIS — M47816 Spondylosis without myelopathy or radiculopathy, lumbar region: Secondary | ICD-10-CM | POA: Diagnosis not present

## 2021-12-06 DIAGNOSIS — M5416 Radiculopathy, lumbar region: Secondary | ICD-10-CM | POA: Diagnosis not present

## 2021-12-09 DIAGNOSIS — J4550 Severe persistent asthma, uncomplicated: Secondary | ICD-10-CM | POA: Diagnosis not present

## 2021-12-09 DIAGNOSIS — Z9989 Dependence on other enabling machines and devices: Secondary | ICD-10-CM | POA: Diagnosis not present

## 2021-12-09 DIAGNOSIS — J398 Other specified diseases of upper respiratory tract: Secondary | ICD-10-CM | POA: Diagnosis not present

## 2021-12-09 DIAGNOSIS — G4733 Obstructive sleep apnea (adult) (pediatric): Secondary | ICD-10-CM | POA: Diagnosis not present

## 2021-12-24 ENCOUNTER — Encounter: Payer: Self-pay | Admitting: Family Medicine

## 2021-12-30 ENCOUNTER — Encounter: Payer: Self-pay | Admitting: Family

## 2021-12-30 ENCOUNTER — Telehealth (INDEPENDENT_AMBULATORY_CARE_PROVIDER_SITE_OTHER): Payer: Medicare Other | Admitting: Family

## 2021-12-30 DIAGNOSIS — R6889 Other general symptoms and signs: Secondary | ICD-10-CM

## 2021-12-30 DIAGNOSIS — Z20828 Contact with and (suspected) exposure to other viral communicable diseases: Secondary | ICD-10-CM

## 2021-12-30 MED ORDER — PROMETHAZINE-DM 6.25-15 MG/5ML PO SYRP: 5.0000 mL | ORAL_SOLUTION | Freq: Three times a day (TID) | ORAL | 0 refills | Status: DC | PRN

## 2021-12-30 MED ORDER — OSELTAMIVIR PHOSPHATE 75 MG PO CAPS: 75.0000 mg | ORAL_CAPSULE | Freq: Two times a day (BID) | ORAL | 0 refills | Status: DC

## 2021-12-30 MED ORDER — BENZONATATE 200 MG PO CAPS: 200.0000 mg | ORAL_CAPSULE | Freq: Three times a day (TID) | ORAL | 1 refills | Status: DC | PRN

## 2021-12-30 MED ORDER — PREDNISONE 10 MG (21) PO TBPK: ORAL_TABLET | ORAL | 0 refills | Status: DC

## 2021-12-30 NOTE — Progress Notes (Signed)
Virtual Visit Consent   Amy Gray, you are scheduled for a virtual visit with a [facility] provider today. Just as with appointments in the office, your consent must be obtained to participate. Your consent will be active for this visit and any virtual visit you may have with one of our providers in the next 365 days. If you have a MyChart account, a copy of this consent can be sent to you electronically.  As this is a virtual visit, video technology does not allow for your provider to perform a traditional examination. This may limit your provider's ability to fully assess your condition. If your provider identifies any concerns that need to be evaluated in person or the need to arrange testing (such as labs, EKG, etc.), we will make arrangements to do so. Although advances in technology are sophisticated, we cannot ensure that it will always work on either your end or our end. If the connection with a video visit is poor, the visit may have to be switched to a telephone visit. With either a video or telephone visit, we are not always able to ensure that we have a secure connection.  By engaging in this virtual visit, you consent to the provision of healthcare and authorize for your insurance to be billed (if applicable) for the services provided during this visit. Depending on your insurance coverage, you may receive a charge related to this service.  I need to obtain your verbal consent now. Are you willing to proceed with your visit today? Amy Gray has provided verbal consent on 12/30/2021 for a virtual visit (video or telephone). Jannifer Rodney, FNP  Date: 12/30/2021 2:53 PM  Virtual Visit via Video Note   I, Jannifer Rodney, connected with  Amy Gray  ([id number], [birth year]) on 12/30/21 at  9:10 AM EST by a video-enabled telemedicine application and verified that I am speaking with the correct person using two identifiers.  Location: Patient: Virtual Visit Location  Patient: Home Provider: Virtual Visit Location Provider: Home Office   I discussed the limitations of evaluation and management by telemedicine and the availability of in person appointments. The patient expressed understanding and agreed to proceed.    History of Present Illness: Amy Gray is a 61 y.o. who identifies as a female who was assigned female at birth, and is being seen today for cough that started today . Reports her grandson and daughter was diagnosed with Flu A this weekend.  HPI: Influenza This is a new problem. The current episode started today. The problem occurs intermittently. The problem has been gradually worsening. Associated symptoms include chills, congestion, coughing, fatigue, a fever, joint swelling and myalgias. Pertinent negatives include no headaches.    Problems:  Patient Active Problem List   Diagnosis Date Noted   Non-seasonal allergic rhinitis 03/30/2019   Vitamin B12 deficiency 03/30/2019   Insomnia due to other mental disorder 03/30/2019   Vitamin D deficiency 03/30/2019   Hot flashes due to menopause 03/30/2019   Normal pressure hydrocephalus (HCC) 10/16/2017   Arthralgia 07/15/2017   IIH (idiopathic intracranial hypertension) 11/24/2016   Dizziness 11/24/2016   Chronic migraine without aura without status migrainosus, not intractable 10/24/2015   Generalized anxiety disorder 10/03/2014   Restless leg syndrome, uncontrolled 10/03/2014   Airway hyperreactivity 03/01/2014   Mixed hyperlipidemia 03/01/2014    Allergies:  Allergies  Allergen Reactions   Pregabalin Shortness Of Breath   Chlorzoxazone Other (See Comments)    Decrease in BP    Diclofenac Other (See Comments)  Other reaction(s): Other Other reaction(s): Other Update Update Update Update Other reaction(s): Other Other reaction(s): Other Update Update Update Update    Metoclopramide Other (See Comments)    Restless legs   Sibutramine     Other reaction(s):  Other update   Topiramate Other (See Comments)   Venlafaxine Other (See Comments)    Restless legs   Amitriptyline    Duloxetine Hcl    Guaifenesin Er    Oxybutynin    Gabapentin     Other reaction(s): Confusion   Propranolol Anxiety   Medications:  Current Outpatient Medications:    benzonatate (TESSALON) 200 MG capsule, Take 1 capsule (200 mg total) by mouth 3 (three) times daily as needed., Disp: 30 capsule, Rfl: 1   oseltamivir (TAMIFLU) 75 MG capsule, Take 1 capsule (75 mg total) by mouth 2 (two) times daily., Disp: 10 capsule, Rfl: 0   predniSONE (STERAPRED UNI-PAK 21 TAB) 10 MG (21) TBPK tablet, Use as directed, Disp: 21 tablet, Rfl: 0   promethazine-dextromethorphan (PROMETHAZINE-DM) 6.25-15 MG/5ML syrup, Take 5 mLs by mouth 3 (three) times daily as needed for cough., Disp: 473 mL, Rfl: 0   acetaZOLAMIDE (DIAMOX) 250 MG tablet, Take 250 mg by mouth 2 (two) times daily. , Disp: , Rfl:    albuterol (PROVENTIL) (2.5 MG/3ML) 0.083% nebulizer solution, Take 3 mLs(1 VIAL) (2.5 mg total) by nebulization every 4 (four) hours as needed for wheezing or shortness of breath., Disp: 180 mL, Rfl: 0   albuterol (VENTOLIN HFA) 108 (90 Base) MCG/ACT inhaler, Inhale 2 puffs into the lungs every 6 (six) hours as needed for wheezing or shortness of breath., Disp: 8.5 g, Rfl: 11   baclofen (LIORESAL) 10 MG tablet, Take 10 mg by mouth 3 (three) times daily., Disp: , Rfl:    BOTOX 200 units SOLR, , Disp: , Rfl:    Budeson-Glycopyrrol-Formoterol (BREZTRI AEROSPHERE) 160-9-4.8 MCG/ACT AERO, Inhale 2 puffs into the lungs in the morning and at bedtime., Disp: 10.7 g, Rfl: 11   budesonide (PULMICORT) 0.25 MG/2ML nebulizer solution, Take 2 mLs (0.25 mg total) by nebulization 2 (two) times daily as needed (shortness of breath)., Disp: 60 mL, Rfl: 12   butalbital-acetaminophen-caffeine (FIORICET, ESGIC) 50-325-40 MG tablet, , Disp: , Rfl:    cetirizine (ZYRTEC) 10 MG tablet, Take 10 mg by mouth daily., Disp: ,  Rfl:    Cholecalciferol (VITAMIN D) 2000 UNITS CAPS, Take 5,000 Units by mouth daily. , Disp: , Rfl:    cyanocobalamin (,VITAMIN B-12,) 1000 MCG/ML injection, Inject 1,000 mcg as directed every 30 (thirty) days., Disp: 3 mL, Rfl: 5   estradiol (ESTRACE) 0.5 MG tablet, Take 1 tablet (0.5 mg total) by mouth daily., Disp: 90 tablet, Rfl: 3   HYDROcodone bit-homatropine (HYCODAN) 5-1.5 MG/5ML syrup, Take 5 mLs by mouth every 8 (eight) hours as needed for cough., Disp: 120 mL, Rfl: 0   meclizine (ANTIVERT) 12.5 MG tablet, Take 1 tablet (12.5 mg total) by mouth 3 (three) times daily as needed for dizziness., Disp: 30 tablet, Rfl: 0   montelukast (SINGULAIR) 10 MG tablet, Take 1 tablet (10 mg total) by mouth at bedtime., Disp: 90 tablet, Rfl: 0   omeprazole (PRILOSEC) 40 MG capsule, Take 1 capsule (40 mg total) by mouth 2 (two) times daily., Disp: 180 capsule, Rfl: 0   prochlorperazine (COMPAZINE) 10 MG tablet, Take 10 mg by mouth every 6 (six) hours as needed for nausea or vomiting., Disp: , Rfl:    promethazine (PHENERGAN) 12.5 MG tablet, Take 12.5 mg  by mouth every 8 (eight) hours as needed for nausea or vomiting., Disp: , Rfl:    rOPINIRole (REQUIP) 4 MG tablet, Take 1 tablet (4 mg total) by mouth at bedtime., Disp: 90 tablet, Rfl: 0   rosuvastatin (CRESTOR) 10 MG tablet, Take 1 tablet (10 mg total) by mouth daily., Disp: 90 tablet, Rfl: 3   sertraline (ZOLOFT) 100 MG tablet, Take 1 tablet (100 mg total) by mouth at bedtime., Disp: 90 tablet, Rfl: 3   traZODone (DESYREL) 150 MG tablet, TAKE 1/3 (ONE-THIRD) to 1 (ONE) tablet BY MOUTH nightly as needed for sleep., Disp: 90 tablet, Rfl: 3   Ubrogepant (UBRELVY PO), Take by mouth., Disp: , Rfl:    zonisamide (ZONEGRAN) 100 MG capsule, Take 100 mg by mouth at bedtime., Disp: , Rfl:   Current Facility-Administered Medications:    cyanocobalamin ((VITAMIN B-12)) injection 1,000 mcg, 1,000 mcg, Intramuscular, Q30 days, Mechele Claude, MD, 1,000 mcg at  01/04/19 1137  Observations/Objective: Patient is well-developed, well-nourished in no acute distress.  Resting comfortably at home.  Head is normocephalic, atraumatic.  No labored breathing.  Speech is clear and coherent with logical content.  Patient is alert and oriented at baseline.  Wheezing and deep cough  Assessment and Plan: 1. Exposure to influenza - oseltamivir (TAMIFLU) 75 MG capsule; Take 1 capsule (75 mg total) by mouth 2 (two) times daily.  Dispense: 10 capsule; Refill: 0 - predniSONE (STERAPRED UNI-PAK 21 TAB) 10 MG (21) TBPK tablet; Use as directed  Dispense: 21 tablet; Refill: 0 - benzonatate (TESSALON) 200 MG capsule; Take 1 capsule (200 mg total) by mouth 3 (three) times daily as needed.  Dispense: 30 capsule; Refill: 1 - promethazine-dextromethorphan (PROMETHAZINE-DM) 6.25-15 MG/5ML syrup; Take 5 mLs by mouth 3 (three) times daily as needed for cough.  Dispense: 473 mL; Refill: 0  2. Flu-like symptoms - oseltamivir (TAMIFLU) 75 MG capsule; Take 1 capsule (75 mg total) by mouth 2 (two) times daily.  Dispense: 10 capsule; Refill: 0 - predniSONE (STERAPRED UNI-PAK 21 TAB) 10 MG (21) TBPK tablet; Use as directed  Dispense: 21 tablet; Refill: 0 - benzonatate (TESSALON) 200 MG capsule; Take 1 capsule (200 mg total) by mouth 3 (three) times daily as needed.  Dispense: 30 capsule; Refill: 1 - promethazine-dextromethorphan (PROMETHAZINE-DM) 6.25-15 MG/5ML syrup; Take 5 mLs by mouth 3 (three) times daily as needed for cough.  Dispense: 473 mL; Refill: 0  Force fluids  Rest Tylenol as needed  Start Tamiflu today Follow up as needed  Follow Up Instructions: I discussed the assessment and treatment plan with the patient. The patient was provided an opportunity to ask questions and all were answered. The patient agreed with the plan and demonstrated an understanding of the instructions.  A copy of instructions were sent to the patient via MyChart unless otherwise noted below.      The patient was advised to call back or seek an in-person evaluation if the symptoms worsen or if the condition fails to improve as anticipated.  Time:  I spent 6 minutes with the patient via telehealth technology discussing the above problems/concerns.    Jannifer Rodney, FNP

## 2022-01-01 ENCOUNTER — Ambulatory Visit: Payer: Medicare Other | Admitting: Family Medicine

## 2022-01-06 ENCOUNTER — Telehealth: Payer: Medicare Other | Admitting: Family

## 2022-01-08 ENCOUNTER — Other Ambulatory Visit: Payer: Self-pay | Admitting: Family Medicine

## 2022-01-08 DIAGNOSIS — J471 Bronchiectasis with (acute) exacerbation: Secondary | ICD-10-CM | POA: Diagnosis not present

## 2022-01-08 DIAGNOSIS — Z20828 Contact with and (suspected) exposure to other viral communicable diseases: Secondary | ICD-10-CM | POA: Diagnosis not present

## 2022-01-08 DIAGNOSIS — R0602 Shortness of breath: Secondary | ICD-10-CM | POA: Diagnosis not present

## 2022-01-08 DIAGNOSIS — U071 COVID-19: Secondary | ICD-10-CM | POA: Diagnosis not present

## 2022-01-14 ENCOUNTER — Encounter: Payer: Self-pay | Admitting: Family Medicine

## 2022-01-14 ENCOUNTER — Ambulatory Visit (INDEPENDENT_AMBULATORY_CARE_PROVIDER_SITE_OTHER): Payer: Medicare Other

## 2022-01-14 ENCOUNTER — Ambulatory Visit (INDEPENDENT_AMBULATORY_CARE_PROVIDER_SITE_OTHER): Payer: Medicare Other | Admitting: Family Medicine

## 2022-01-14 VITALS — BP 122/78 | HR 87 | Temp 97.3°F | Ht 62.0 in | Wt 175.0 lb

## 2022-01-14 DIAGNOSIS — U071 COVID-19: Secondary | ICD-10-CM

## 2022-01-14 DIAGNOSIS — R0602 Shortness of breath: Secondary | ICD-10-CM

## 2022-01-14 DIAGNOSIS — J471 Bronchiectasis with (acute) exacerbation: Secondary | ICD-10-CM | POA: Diagnosis not present

## 2022-01-14 DIAGNOSIS — R059 Cough, unspecified: Secondary | ICD-10-CM | POA: Diagnosis not present

## 2022-01-14 MED ORDER — DOXYCYCLINE HYCLATE 100 MG PO TABS: 100.0000 mg | ORAL_TABLET | Freq: Two times a day (BID) | ORAL | 0 refills | Status: AC

## 2022-01-14 MED ORDER — HYDROCODONE BIT-HOMATROP MBR 5-1.5 MG/5ML PO SOLN: 5.0000 mL | Freq: Three times a day (TID) | ORAL | 0 refills | Status: DC | PRN

## 2022-01-14 NOTE — Progress Notes (Signed)
Subjective: QM:GQQPY, SOB PCP: Mechele Claude, MD PPJ:[id number] Minor is a 61 y.o. female presenting to clinic today for:  1.  Cough, shortness of breath Patient has been seen a couple of times over the last several weeks.  She was initially diagnosed with influenza back on the 11th and empirically treated with Tamiflu.  She was subsequently seen on 01/08/2022 by her pulmonologist and found to be COVID-positive.  Because she was outside of the window for treatment she was not treated with Paxlovid but instead treated with another round of steroids and empirically treated with oral antibiotics.  No imaging has been obtained up until this point.  However, she reports ongoing symptoms.  Worried about pneumonia.  Medical history significant for bronchiectasis, severe asthma.  She denies any hemoptysis.  She continues to have shortness of breath, cough.  This is not well relieved by promethazine with dextromethorphan or Tessalon Perles.  She took her last dose of doxycycline today but still has a few more days of prednisone left over.  She tried to contact her pulmonologist today for repeat evaluation given persistent symptoms but was told she could not be seen for at least 2 more weeks.  Overall she just feels weak and worn out.   ROS: Per HPI  Allergies  Allergen Reactions   Pregabalin Shortness Of Breath   Chlorzoxazone Other (See Comments)    Decrease in BP    Diclofenac Other (See Comments)    Other reaction(s): Other Other reaction(s): Other Update Update Update Update Other reaction(s): Other Other reaction(s): Other Update Update Update Update    Metoclopramide Other (See Comments)    Restless legs   Sibutramine     Other reaction(s): Other update   Topiramate Other (See Comments)   Venlafaxine Other (See Comments)    Restless legs   Amitriptyline    Duloxetine Hcl    Guaifenesin Er    Oxybutynin    Gabapentin     Other reaction(s): Confusion   Propranolol Anxiety    Past Medical History:  Diagnosis Date   Anxiety    Asthma    COPD (chronic obstructive pulmonary disease) (HCC)    Depression    Emphysema of lung (HCC)    GERD (gastroesophageal reflux disease)    Hyperlipidemia    Migraines    Osteoarthritis    Sleep apnea    Very mild,use for moderate airway collapse    Current Outpatient Medications:    acetaZOLAMIDE (DIAMOX) 250 MG tablet, Take 250 mg by mouth 2 (two) times daily. , Disp: , Rfl:    albuterol (PROVENTIL) (2.5 MG/3ML) 0.083% nebulizer solution, Take 3 mLs(1 VIAL) (2.5 mg total) by nebulization every 4 (four) hours as needed for wheezing or shortness of breath., Disp: 180 mL, Rfl: 0   albuterol (VENTOLIN HFA) 108 (90 Base) MCG/ACT inhaler, Inhale 2 puffs into the lungs every 6 (six) hours as needed for wheezing or shortness of breath., Disp: 8.5 g, Rfl: 11   baclofen (LIORESAL) 10 MG tablet, Take 10 mg by mouth 3 (three) times daily., Disp: , Rfl:    benzonatate (TESSALON) 200 MG capsule, Take 1 capsule (200 mg total) by mouth 3 (three) times daily as needed., Disp: 30 capsule, Rfl: 1   BOTOX 200 units SOLR, , Disp: , Rfl:    Budeson-Glycopyrrol-Formoterol (BREZTRI AEROSPHERE) 160-9-4.8 MCG/ACT AERO, Inhale 2 puffs into the lungs in the morning and at bedtime., Disp: 10.7 g, Rfl: 11   budesonide (PULMICORT) 0.25 MG/2ML nebulizer solution, Take 2  mLs (0.25 mg total) by nebulization 2 (two) times daily as needed (shortness of breath)., Disp: 60 mL, Rfl: 12   butalbital-acetaminophen-caffeine (FIORICET, ESGIC) 50-325-40 MG tablet, , Disp: , Rfl:    cetirizine (ZYRTEC) 10 MG tablet, Take 10 mg by mouth daily., Disp: , Rfl:    Cholecalciferol (VITAMIN D) 2000 UNITS CAPS, Take 5,000 Units by mouth daily. , Disp: , Rfl:    cyanocobalamin (,VITAMIN B-12,) 1000 MCG/ML injection, Inject 1,000 mcg as directed every 30 (thirty) days., Disp: 3 mL, Rfl: 5   estradiol (ESTRACE) 0.5 MG tablet, Take 1 tablet (0.5 mg total) by mouth daily., Disp: 90  tablet, Rfl: 3   HYDROcodone bit-homatropine (HYCODAN) 5-1.5 MG/5ML syrup, Take 5 mLs by mouth every 8 (eight) hours as needed for cough., Disp: 120 mL, Rfl: 0   meclizine (ANTIVERT) 12.5 MG tablet, Take 1 tablet (12.5 mg total) by mouth 3 (three) times daily as needed for dizziness., Disp: 30 tablet, Rfl: 0   montelukast (SINGULAIR) 10 MG tablet, Take 1 tablet (10 mg total) by mouth at bedtime., Disp: 90 tablet, Rfl: 0   omeprazole (PRILOSEC) 40 MG capsule, Take 1 capsule (40 mg total) by mouth 2 (two) times daily., Disp: 180 capsule, Rfl: 0   oseltamivir (TAMIFLU) 75 MG capsule, Take 1 capsule (75 mg total) by mouth 2 (two) times daily., Disp: 10 capsule, Rfl: 0   predniSONE (STERAPRED UNI-PAK 21 TAB) 10 MG (21) TBPK tablet, Use as directed, Disp: 21 tablet, Rfl: 0   prochlorperazine (COMPAZINE) 10 MG tablet, Take 10 mg by mouth every 6 (six) hours as needed for nausea or vomiting., Disp: , Rfl:    promethazine (PHENERGAN) 12.5 MG tablet, Take 12.5 mg by mouth every 8 (eight) hours as needed for nausea or vomiting., Disp: , Rfl:    promethazine-dextromethorphan (PROMETHAZINE-DM) 6.25-15 MG/5ML syrup, Take 5 mLs by mouth 3 (three) times daily as needed for cough., Disp: 473 mL, Rfl: 0   rOPINIRole (REQUIP) 4 MG tablet, Take 1 tablet (4 mg total) by mouth at bedtime., Disp: 90 tablet, Rfl: 0   rosuvastatin (CRESTOR) 10 MG tablet, Take 1 tablet (10 mg total) by mouth daily., Disp: 90 tablet, Rfl: 3   sertraline (ZOLOFT) 100 MG tablet, Take 1 tablet (100 mg total) by mouth at bedtime., Disp: 90 tablet, Rfl: 3   traZODone (DESYREL) 150 MG tablet, TAKE 1/3 (ONE-THIRD) to 1 (ONE) tablet BY MOUTH nightly as needed for sleep., Disp: 90 tablet, Rfl: 3   Ubrogepant (UBRELVY PO), Take by mouth., Disp: , Rfl:    zonisamide (ZONEGRAN) 100 MG capsule, Take 100 mg by mouth at bedtime., Disp: , Rfl:   Current Facility-Administered Medications:    cyanocobalamin ((VITAMIN B-12)) injection 1,000 mcg, 1,000 mcg,  Intramuscular, Q30 days, Mechele ClaudeStacks, Warren, MD, 1,000 mcg at 01/04/19 1137 Social History   Socioeconomic History   Marital status: Married    Spouse name: Not on file   Number of children: 2   Years of education: Not on file   Highest education level: 12th grade  Occupational History   Occupation: Disabled  Tobacco Use   Smoking status: Never   Smokeless tobacco: Never  Substance and Sexual Activity   Alcohol use: No   Drug use: No   Sexual activity: Not on file  Other Topics Concern   Not on file  Social History Narrative   Not on file   Social Determinants of Health   Financial Resource Strain: Low Risk  (07/31/2021)   Overall  Financial Resource Strain (CARDIA)    Difficulty of Paying Living Expenses: Not hard at all  Food Insecurity: No Food Insecurity (07/31/2021)   Hunger Vital Sign    Worried About Running Out of Food in the Last Year: Never true    Ran Out of Food in the Last Year: Never true  Transportation Needs: No Transportation Needs (07/31/2021)   PRAPARE - Administrator, Civil Service (Medical): No    Lack of Transportation (Non-Medical): No  Physical Activity: Inactive (07/31/2021)   Exercise Vital Sign    Days of Exercise per Week: 0 days    Minutes of Exercise per Session: 0 min  Stress: No Stress Concern Present (07/31/2021)   [employer] of Occupational Health - Occupational Stress Questionnaire    Feeling of Stress : Not at all  Social Connections: Socially Integrated (07/31/2021)   Social Connection and Isolation Panel [NHANES]    Frequency of Communication with Friends and Family: More than three times a week    Frequency of Social Gatherings with Friends and Family: Three times a week    Attends Religious Services: More than 4 times per year    Active Member of Clubs or Organizations: Yes    Attends Banker Meetings: More than 4 times per year    Marital Status: Married  Catering manager Violence: Not At Risk (07/31/2021)    Humiliation, Afraid, Rape, and Kick questionnaire    Fear of Current or Ex-Partner: No    Emotionally Abused: No    Physically Abused: No    Sexually Abused: No   Family History  Problem Relation Age of Onset   Arthritis Mother    Asthma Mother    Heart disease Mother    Arthritis Father    Heart disease Father    Heart attack Father    Hypertension Sister     Objective: Office vital signs reviewed. BP 122/78   Pulse 87   Temp (!) 97.3 F (36.3 C)   Ht 5\' 2"  (1.575 m)   Wt 175 lb (79.4 kg)   SpO2 97%   BMI 32.01 kg/m   Physical Examination:  General: Awake, alert, nontoxic female, No acute distress HEENT: sclera white. Cardio: regular rate and rhythm, S1S2 heard, no murmurs appreciated Pulm: clear to auscultation bilaterally, no wheezes, rhonchi or rales; normal work of breathing on room air.  Quite a bit of harsh coughing noted during the exam  DG Chest 2 View  Result Date: 01/14/2022 CLINICAL DATA:  Cough EXAM: CHEST - 2 VIEW COMPARISON:  Chest radiograph dated 11/11/2021 FINDINGS: Lines/tubes: Partially imaged ventriculoperitoneal shunt catheter courses over the right chest and abdomen. Chest: Lungs are clear without focal consolidation. Pleura: No pneumothorax or pleural effusion. Heart/mediastinum: The heart size and mediastinal contours are within normal limits. Bones: Partially imaged cervical spinal fixation hardware appears intact. Unchanged compression deformity of approximately T11. IMPRESSION: No active cardiopulmonary disease. Electronically Signed   By: 11/13/2021 M.D.   On: 01/14/2022 13:33    Assessment/ Plan: 61 y.o. female   Shortness of breath - Plan: DG Chest 2 View, doxycycline (VIBRA-TABS) 100 MG tablet, HYDROcodone bit-homatropine (HYCODAN) 5-1.5 MG/5ML syrup  COVID-19 virus detected - Plan: DG Chest 2 View, doxycycline (VIBRA-TABS) 100 MG tablet, HYDROcodone bit-homatropine (HYCODAN) 5-1.5 MG/5ML syrup  Bronchiectasis with (acute) exacerbation  (HCC) - Plan: doxycycline (VIBRA-TABS) 100 MG tablet, HYDROcodone bit-homatropine (HYCODAN) 5-1.5 MG/5ML syrup  Personal review of chest x-ray demonstrated viral changes.  Formal review by  radiology did not reveal any pneumonia or other intrapulmonary concerns.  I am going to extend her doxycycline out given known severe asthma and ongoing symptoms.  I have changed her promethazine with dextromethorphan to Hycodan.  We discussed red flag signs and symptoms warranting further evaluation.  Follow-up with PCP or pulmonology within the next 2 weeks if symptoms are ongoing.  Continue inhalers as directed by pulmonology  Orders Placed This Encounter  Procedures   DG Chest 2 View    Standing Status:   Future    Standing Expiration Date:   01/15/2023    Order Specific Question:   Reason for Exam (SYMPTOM  OR DIAGNOSIS REQUIRED)    Answer:   cough    Order Specific Question:   Preferred imaging location?    Answer:   Internal   No orders of the defined types were placed in this encounter.    Raliegh Ip, DO [facility] [phone]

## 2022-01-14 NOTE — Patient Instructions (Signed)
No pneumonia. I have extended out the doxycycline another week just to cover your lungs I have changed your promethazine with dextromethorphan cough syrup to Hycodan cough syrup.  The Hycodan does have codeine so caution.  No driving without medication. Continue all inhalers as directed.  Finish off prednisone as directed. Follow-up if cough continues not to resolve and/or you develop fevers or bloody or brown sputum.

## 2022-01-24 ENCOUNTER — Other Ambulatory Visit: Payer: Self-pay | Admitting: Family Medicine

## 2022-01-24 DIAGNOSIS — G2581 Restless legs syndrome: Secondary | ICD-10-CM

## 2022-01-24 DIAGNOSIS — M797 Fibromyalgia: Secondary | ICD-10-CM

## 2022-01-24 DIAGNOSIS — J45909 Unspecified asthma, uncomplicated: Secondary | ICD-10-CM

## 2022-01-28 DIAGNOSIS — G43709 Chronic migraine without aura, not intractable, without status migrainosus: Secondary | ICD-10-CM | POA: Diagnosis not present

## 2022-01-28 DIAGNOSIS — G43719 Chronic migraine without aura, intractable, without status migrainosus: Secondary | ICD-10-CM | POA: Diagnosis not present

## 2022-01-30 DIAGNOSIS — J398 Other specified diseases of upper respiratory tract: Secondary | ICD-10-CM | POA: Diagnosis not present

## 2022-01-30 DIAGNOSIS — J4550 Severe persistent asthma, uncomplicated: Secondary | ICD-10-CM | POA: Diagnosis not present

## 2022-01-30 DIAGNOSIS — R059 Cough, unspecified: Secondary | ICD-10-CM | POA: Diagnosis not present

## 2022-02-04 DIAGNOSIS — E559 Vitamin D deficiency, unspecified: Secondary | ICD-10-CM | POA: Diagnosis not present

## 2022-02-04 DIAGNOSIS — Z886 Allergy status to analgesic agent status: Secondary | ICD-10-CM | POA: Diagnosis not present

## 2022-02-04 DIAGNOSIS — E785 Hyperlipidemia, unspecified: Secondary | ICD-10-CM | POA: Diagnosis not present

## 2022-02-04 DIAGNOSIS — Z79890 Hormone replacement therapy (postmenopausal): Secondary | ICD-10-CM | POA: Diagnosis not present

## 2022-02-04 DIAGNOSIS — E876 Hypokalemia: Secondary | ICD-10-CM | POA: Diagnosis not present

## 2022-02-04 DIAGNOSIS — Z888 Allergy status to other drugs, medicaments and biological substances status: Secondary | ICD-10-CM | POA: Diagnosis not present

## 2022-02-04 DIAGNOSIS — J4550 Severe persistent asthma, uncomplicated: Secondary | ICD-10-CM | POA: Diagnosis not present

## 2022-02-04 DIAGNOSIS — G4730 Sleep apnea, unspecified: Secondary | ICD-10-CM | POA: Diagnosis not present

## 2022-02-04 DIAGNOSIS — F32A Depression, unspecified: Secondary | ICD-10-CM | POA: Diagnosis not present

## 2022-02-04 DIAGNOSIS — E7800 Pure hypercholesterolemia, unspecified: Secondary | ICD-10-CM | POA: Diagnosis not present

## 2022-02-04 DIAGNOSIS — R079 Chest pain, unspecified: Secondary | ICD-10-CM | POA: Diagnosis not present

## 2022-02-04 DIAGNOSIS — Z7951 Long term (current) use of inhaled steroids: Secondary | ICD-10-CM | POA: Diagnosis not present

## 2022-02-04 DIAGNOSIS — I2699 Other pulmonary embolism without acute cor pulmonale: Secondary | ICD-10-CM | POA: Diagnosis not present

## 2022-02-04 DIAGNOSIS — G43909 Migraine, unspecified, not intractable, without status migrainosus: Secondary | ICD-10-CM | POA: Diagnosis not present

## 2022-02-04 DIAGNOSIS — Z8616 Personal history of COVID-19: Secondary | ICD-10-CM | POA: Diagnosis not present

## 2022-02-04 DIAGNOSIS — Z982 Presence of cerebrospinal fluid drainage device: Secondary | ICD-10-CM | POA: Diagnosis not present

## 2022-02-04 DIAGNOSIS — Z79899 Other long term (current) drug therapy: Secondary | ICD-10-CM | POA: Diagnosis not present

## 2022-02-04 DIAGNOSIS — R0602 Shortness of breath: Secondary | ICD-10-CM | POA: Diagnosis not present

## 2022-02-04 DIAGNOSIS — J479 Bronchiectasis, uncomplicated: Secondary | ICD-10-CM | POA: Diagnosis not present

## 2022-02-04 DIAGNOSIS — E538 Deficiency of other specified B group vitamins: Secondary | ICD-10-CM | POA: Diagnosis not present

## 2022-02-04 DIAGNOSIS — K219 Gastro-esophageal reflux disease without esophagitis: Secondary | ICD-10-CM | POA: Diagnosis not present

## 2022-02-04 DIAGNOSIS — G4733 Obstructive sleep apnea (adult) (pediatric): Secondary | ICD-10-CM | POA: Diagnosis not present

## 2022-02-04 DIAGNOSIS — M7989 Other specified soft tissue disorders: Secondary | ICD-10-CM | POA: Diagnosis not present

## 2022-02-04 DIAGNOSIS — R918 Other nonspecific abnormal finding of lung field: Secondary | ICD-10-CM | POA: Diagnosis not present

## 2022-02-04 DIAGNOSIS — R0789 Other chest pain: Secondary | ICD-10-CM | POA: Diagnosis not present

## 2022-02-04 DIAGNOSIS — J398 Other specified diseases of upper respiratory tract: Secondary | ICD-10-CM | POA: Diagnosis not present

## 2022-02-04 DIAGNOSIS — J4520 Mild intermittent asthma, uncomplicated: Secondary | ICD-10-CM | POA: Diagnosis not present

## 2022-02-04 DIAGNOSIS — R059 Cough, unspecified: Secondary | ICD-10-CM | POA: Diagnosis not present

## 2022-02-04 DIAGNOSIS — M1990 Unspecified osteoarthritis, unspecified site: Secondary | ICD-10-CM | POA: Diagnosis not present

## 2022-02-04 DIAGNOSIS — Z981 Arthrodesis status: Secondary | ICD-10-CM | POA: Diagnosis not present

## 2022-02-04 DIAGNOSIS — G43009 Migraine without aura, not intractable, without status migrainosus: Secondary | ICD-10-CM | POA: Diagnosis not present

## 2022-02-04 DIAGNOSIS — F419 Anxiety disorder, unspecified: Secondary | ICD-10-CM | POA: Diagnosis not present

## 2022-02-04 DIAGNOSIS — J9811 Atelectasis: Secondary | ICD-10-CM | POA: Diagnosis not present

## 2022-02-04 DIAGNOSIS — G932 Benign intracranial hypertension: Secondary | ICD-10-CM | POA: Diagnosis not present

## 2022-02-07 ENCOUNTER — Encounter: Payer: Self-pay | Admitting: *Deleted

## 2022-02-07 ENCOUNTER — Telehealth: Payer: Self-pay | Admitting: *Deleted

## 2022-02-07 ENCOUNTER — Encounter: Payer: Self-pay | Admitting: Family Medicine

## 2022-02-07 NOTE — Patient Outreach (Signed)
  Care Coordination [facility] Note Transition Care Management Follow-up Telephone Call Date of discharge and from where: [facility] on 02/06/22 How have you been since you were released from the hospital? "Well yesterday, I was pretty good. Today I'm more lightheaded and more short of breath. But they told me that was to be expected." Denies chest pain. Did not sound to be in any acute distress while on the phone. Able to answer questions normally without pausing to breathe. Any questions or concerns? No  Items Reviewed: Did the pt receive and understand the discharge instructions provided? Yes  Medications obtained and verified? Yes Eliquis dose at 10 mg twice daily for a total of (seven) 7 days. Then you will start 5 mg twice daily until you are instructed to stop. Likely 3 months. D/c estrogen Other? Yes Reviewed bleeding risk with blood thinner and s/s of bleeding. Seek medical advice/attention for any falls, especially if she hits her head Any new allergies since your discharge? No  Dietary orders reviewed? Yes Do you have support at home? Yes   Home Care and Equipment/Supplies: Were home health services ordered? no If so, what is the name of the agency?   Has the agency set up a time to come to the patient's home? not applicable Were any new equipment or medical supplies ordered?  No What is the name of the medical supply agency?  Were you able to get the supplies/equipment? not applicable Do you have any questions related to the use of the equipment or supplies? No  Functional Questionnaire: (I = Independent and D = Dependent) ADLs: I  Bathing/Dressing- I  Meal Prep- I  Eating- I  Maintaining continence- I  Transferring/Ambulation- I  Managing Meds- I  Follow up appointments reviewed:  PCP Hospital f/u appt confirmed? Yes  Scheduled to see Marjorie Smolder, FNP on 02/10/22 at [facility] f/u appt confirmed? Yes  Scheduled to see Hulan Amato, FNP (Hematology) on 03/06/22  8:45 Are transportation arrangements needed? No  If their condition worsens, is the pt aware to call PCP or go to the Emergency Dept.? Yes Was the patient provided with contact information for the PCP's office or ED? Yes Was to pt encouraged to call back with questions or concerns? Yes  SDOH assessments and interventions completed:   Yes SDOH Interventions Today    Flowsheet Row Most Recent Value  SDOH Interventions   Transportation Interventions Intervention Not Indicated  Financial Strain Interventions Intervention Not Indicated       Care Coordination Interventions:  Outlined above    Encounter Outcome:  Pt. Visit Completed    Chong Sicilian, BSN, RN-BC RN Care Coordinator [place]: [phone] Main #: [phone]

## 2022-02-10 ENCOUNTER — Encounter: Payer: Self-pay | Admitting: Family Medicine

## 2022-02-10 ENCOUNTER — Ambulatory Visit (INDEPENDENT_AMBULATORY_CARE_PROVIDER_SITE_OTHER): Payer: Medicare Other | Admitting: Family Medicine

## 2022-02-10 ENCOUNTER — Ambulatory Visit: Payer: Medicare Other | Admitting: Family Medicine

## 2022-02-10 VITALS — BP 106/68 | HR 78 | Temp 96.2°F | Resp 20 | Ht 62.0 in | Wt 175.0 lb

## 2022-02-10 DIAGNOSIS — Z09 Encounter for follow-up examination after completed treatment for conditions other than malignant neoplasm: Secondary | ICD-10-CM | POA: Diagnosis not present

## 2022-02-10 DIAGNOSIS — I2699 Other pulmonary embolism without acute cor pulmonale: Secondary | ICD-10-CM

## 2022-02-10 DIAGNOSIS — E876 Hypokalemia: Secondary | ICD-10-CM | POA: Diagnosis not present

## 2022-02-10 LAB — CBC WITH DIFFERENTIAL/PLATELET
Basophils Absolute: 0.1 10*3/uL (ref 0.0–0.2)
Basos: 1 %
EOS (ABSOLUTE): 0.2 10*3/uL (ref 0.0–0.4)
Eos: 3 %
Hematocrit: 40.3 % (ref 34.0–46.6)
Hemoglobin: 13.4 g/dL (ref 11.1–15.9)
Immature Grans (Abs): 0 10*3/uL (ref 0.0–0.1)
Immature Granulocytes: 1 %
Lymphocytes Absolute: 2.2 10*3/uL (ref 0.7–3.1)
Lymphs: 39 %
MCH: 30.5 pg (ref 26.6–33.0)
MCHC: 33.3 g/dL (ref 31.5–35.7)
MCV: 92 fL (ref 79–97)
Monocytes Absolute: 0.6 10*3/uL (ref 0.1–0.9)
Monocytes: 10 %
Neutrophils Absolute: 2.6 10*3/uL (ref 1.4–7.0)
Neutrophils: 46 %
Platelets: 295 10*3/uL (ref 150–450)
RBC: 4.4 x10E6/uL (ref 3.77–5.28)
RDW: 13.2 % (ref 11.7–15.4)
WBC: 5.7 10*3/uL (ref 3.4–10.8)

## 2022-02-10 LAB — BMP8+EGFR
BUN/Creatinine Ratio: 18 (ref 12–28)
BUN: 19 mg/dL (ref 8–27)
CO2: 19 mmol/L — ABNORMAL LOW (ref 20–29)
Calcium: 9.3 mg/dL (ref 8.7–10.3)
Chloride: 109 mmol/L — ABNORMAL HIGH (ref 96–106)
Creatinine, Ser: 1.04 mg/dL — ABNORMAL HIGH (ref 0.57–1.00)
Glucose: 88 mg/dL (ref 70–99)
Potassium: 4 mmol/L (ref 3.5–5.2)
Sodium: 142 mmol/L (ref 134–144)
eGFR: 61 mL/min/{1.73_m2} (ref >59)

## 2022-02-10 NOTE — Progress Notes (Signed)
Established Patient Office Visit  Subjective   Patient ID: Amy Gray, female    DOB: [birth year]  Age: 62 y.o. MRN: [id number]  Chief Complaint  Patient presents with   Hospitalization Follow-up    HPI  Today's visit was for Transitional Care Management.  The patient was discharged from [facility] on 02/06/22 with a primary diagnosis of acute Pe.   Contact with the patient and/or caregiver, by a clinical staff member, was made on 02/07/22 and was documented as a telephone encounter within the EMR.  Through chart review and discussion with the patient I have determined that management of their condition is of moderate complexity.   Amy Gray presented to the ER at [facility] on 02/04/21 for substernal chest pain x 10 days and exertional dyspnea. She had reassuring lab work up expect for mild hypokalemia. An acute PE of the right upper lobe segmental arteries was found on CT angio. She also had a normal EKG, echo, and bilateral DVT ultrasounds. Cardio was consulted on the hospital. She was started on IV heparin and transitioned to oral eliquis on discharge. She was given a starter pack and a prescription for eliquis 5 mg BID was sent in for when she completes the starter pack.  She was instructed to stay on eliquis for 3 months. She was given potassium supplement in the hospital, but was not discharged with a supplement. PE was though to have occurred due to recent Covid 19 infection and oral estrogen use. She was instructed to discontinue estrogen. She was referred to hematology for further work up as her sister also has a blood clot. She has an appt for consult with hematology on 03/06/22.   She reports that she is feeling better. She continues to have some mild substernal chest pain with deep breathing and some dyspnea with exertion. Overall this has been improving slowly.    Past Medical History:  Diagnosis Date   Anxiety    Asthma    COPD (chronic obstructive  pulmonary disease) (HCC)    Depression    Emphysema of lung (HCC)    GERD (gastroesophageal reflux disease)    Hyperlipidemia    Migraines    Osteoarthritis    Sleep apnea    Very mild,use for moderate airway collapse      ROS As per HPI.    Objective:     BP 106/68   Pulse 78   Temp (!) 96.2 F (35.7 C) (Oral)   Resp 20   Ht 5\' 2"  (1.575 m)   Wt 175 lb (79.4 kg)   SpO2 95%   BMI 32.01 kg/m    Physical Exam Vitals and nursing note reviewed.  Constitutional:      General: She is not in acute distress.    Appearance: She is not ill-appearing, toxic-appearing or diaphoretic.  HENT:     Head: Normocephalic and atraumatic.  Neck:     Vascular: No JVD.  Cardiovascular:     Rate and Rhythm: Normal rate and regular rhythm.     Heart sounds: Normal heart sounds. No murmur heard. Pulmonary:     Effort: Pulmonary effort is normal. No respiratory distress.     Breath sounds: Normal breath sounds. No wheezing, rhonchi or rales.  Musculoskeletal:     Cervical back: Neck supple. No rigidity.     Right lower leg: No edema.     Left lower leg: No edema.  Skin:    General: Skin is warm.  Neurological:  General: No focal deficit present.     Mental Status: She is alert and oriented to person, place, and time.  Psychiatric:        Mood and Affect: Mood normal.        Thought Content: Thought content normal.        Judgment: Judgment normal.      No results found for any visits on 02/10/22.    The 10-year ASCVD risk score (Arnett DK, et al., 2019) is: 1.8%    Assessment & Plan:   Amy Gray was seen today for hospitalization follow-up.  Diagnoses and all orders for this visit:  Acute pulmonary embolism, unspecified pulmonary embolism type, unspecified whether acute cor pulmonale present (HCC) Improving symptoms. Complete eliquis starter pack, then continue 5 mg BID x 3 months. Keep hematology appt on 03/06/22. Discontinue estrogen tablet as instructed. Benign  exam today. Labs pending.  -     CBC with Differential/Platelet -     BMP8+EGFR  Hypokalemia Supplement in the hospital, not currently on supplement. Labs pending. [facility] discharge follow-up Reviewed hospital records, imaging, labs.   Return in about 3 months (around 05/12/2022) for chronic follow up with PCP.   The patient indicates understanding of these issues and agrees with the plan.  Gwenlyn Perking, FNP

## 2022-02-10 NOTE — Patient Instructions (Signed)
Pulmonary Embolism  A pulmonary embolism (PE) is a sudden blockage or decrease of blood flow in one or both lungs that happens when a clot travels into the arteries of the lung (pulmonary arteries). Most blockages come from a blood clot that forms in the vein of a leg or arm (deep vein thrombosis, DVT) and travels to the lungs. A clot is blood that has thickened into a gel or solid. PE is a dangerous and life-threatening condition that needs to be treated right away. What are the causes? This condition is usually caused by a blood clot that forms in a vein and moves to the lungs. In rare cases, it may be caused by air, fat, part of a tumor, or other tissue that moves through the veins and into the lungs. What increases the risk? The following factors may make you more likely to develop this condition: Experiencing a traumatic injury, such as breaking a hip or leg. Having: A spinal cord injury. Major surgery, especially hip or knee replacement, or surgery on parts of the nervous system or on the abdomen. A stroke. A blood-clotting disease. Long-term (chronic) lung or heart disease. Cancer, especially if you are being treated with chemotherapy. A central venous catheter. Taking medicines that contain estrogen. These include birth control pills and hormone replacement therapy. Being: Pregnant. In the period of time after your baby is delivered (postpartum). Older than age 60. Overweight. A smoker, especially if you have other risks. Not very active (sedentary), not being able to move at all, or spending long periods sitting, such as travel over 6 hours. You are also at a greater risk if you have a leg in a cast or splint. What are the signs or symptoms? Symptoms of this condition usually start suddenly and include: Shortness of breath during activity or at rest. Coughing, coughing up blood, or coughing up bloody mucus. Chest pain, back pain, or shoulder blade pain that gets worse with deep  breaths. Rapid or irregular heartbeat. Feeling light-headed or dizzy, or fainting. Feeling anxious. Pain and swelling in a leg. This is a symptom of DVT, which can lead to PE. How is this diagnosed? This condition may be diagnosed based on your medical history, a physical exam, and tests. Tests may include: Blood tests. An ECG (electrocardiogram) of the heart. A CT pulmonary angiogram. This test checks blood flow in and around your lungs. A ventilation-perfusion scan, also called a lung VQ scan. This test measures air flow and blood flow to the lungs. An ultrasound to check for a DVT. How is this treated? Treatment for this condition depends on many factors, such as the cause of your PE, your risk for bleeding or developing more clots, and other medical conditions you may have. Treatment aims to stop blood clots from forming or growing larger. In some cases, treatment may be aimed at breaking apart or removing the blood clot. Treatment may include: Medicines, such as: Blood thinning medicines, also called anticoagulants, to stop clots from forming and growing. Medicines that break apart clots (fibrinolytics). Procedures, such as: Using a flexible tube to remove a blood clot (embolectomy) or to deliver medicine to destroy it (catheter-directed thrombolysis). Surgery to remove the clot (surgical embolectomy). This is rare. You may need a combination of immediate, long-term, and extended treatments. Your treatment may continue for several months (maintenance therapy) or longer depending on your medical conditions. You and your health care provider will work together to choose the treatment program that is best for you.   Follow these instructions at home: Medicines Take over-the-counter and prescription medicines only as told by your health care provider. If you are taking blood thinners: Talk with your health care provider before you take any medicines that contain aspirin or NSAIDs, such as  ibuprofen. These medicines increase your risk for dangerous bleeding. Take your medicine exactly as told, at the same time every day. Avoid activities that could cause injury or bruising, and follow instructions about how to prevent falls. Wear a medical alert bracelet or carry a card that lists what medicines you take. Understand what foods and drugs interact with any medicines that you are taking. General instructions Ask your health care provider when you may return to your normal activities. Avoid sitting or lying for a long time without moving. Maintain a healthy weight. Ask your health care provider what weight is healthy for you. Do not use any products that contain nicotine or tobacco. These products include cigarettes, chewing tobacco, and vaping devices, such as e-cigarettes. If you need help quitting, ask your health care provider. Talk with your health care provider about any travel plans. It is important to make sure that you are still able to take your medicine while traveling. Keep all follow-up visits. This is important. Where to find more information American Lung Association: www.lung.org Centers for Disease Control and Prevention: www.cdc.gov Contact a health care provider if: You missed a dose of your blood thinner medicine. You have a fever. Get help right away if: You have: New or increased pain, swelling, warmth, or redness in an arm or leg. Shortness of breath that gets worse during activity or at rest. Worsening chest pain. A rapid or irregular heartbeat. A severe headache. Vision changes. A serious fall or accident, or you hit your head. Blood in your vomit, stool, or urine. A cut that will not stop bleeding. You cough up blood. You feel light-headed or dizzy, and that feeling does not go away. You cannot move your arms or legs. You are confused or have memory loss. These symptoms may represent a serious problem that is an emergency. Do not wait to see if the  symptoms will go away. Get medical help right away. Call your local emergency services (911 in the [place] Do not drive yourself to the hospital. Summary A pulmonary embolism (PE) is a serious and potentially life-threatening condition. It happens when a blood clot from one part of the body travels to the arteries of the lung, causing a sudden blockage or decrease of blood flow to the lungs. This may result in shortness of breath, chest pain, dizziness, and fainting. Treatments for this condition usually include medicines to thin your blood (anticoagulants) or medicines to break apart blood clots. If you are given blood thinners, take your medicine exactly as told by your health care provider, at the same time every day. This is important. Understand what foods and drugs interact with any medicines that you are taking. If you have signs of PE or DVT, call your local emergency services (911 in the [place] This information is not intended to replace advice given to you by your health care provider. Make sure you discuss any questions you have with your health care provider. Document Revised: 12/09/2019 Document Reviewed: 12/09/2019 Elsevier Patient Education  2023 Elsevier Inc.  

## 2022-02-11 DIAGNOSIS — I2699 Other pulmonary embolism without acute cor pulmonale: Secondary | ICD-10-CM | POA: Diagnosis not present

## 2022-02-11 DIAGNOSIS — J4550 Severe persistent asthma, uncomplicated: Secondary | ICD-10-CM | POA: Diagnosis not present

## 2022-02-11 DIAGNOSIS — G4733 Obstructive sleep apnea (adult) (pediatric): Secondary | ICD-10-CM | POA: Diagnosis not present

## 2022-02-18 DIAGNOSIS — R0602 Shortness of breath: Secondary | ICD-10-CM | POA: Diagnosis not present

## 2022-02-18 DIAGNOSIS — R072 Precordial pain: Secondary | ICD-10-CM | POA: Diagnosis not present

## 2022-02-18 DIAGNOSIS — G4730 Sleep apnea, unspecified: Secondary | ICD-10-CM | POA: Diagnosis not present

## 2022-02-18 DIAGNOSIS — Z7901 Long term (current) use of anticoagulants: Secondary | ICD-10-CM | POA: Diagnosis not present

## 2022-02-18 DIAGNOSIS — R059 Cough, unspecified: Secondary | ICD-10-CM | POA: Diagnosis not present

## 2022-02-18 DIAGNOSIS — M1990 Unspecified osteoarthritis, unspecified site: Secondary | ICD-10-CM | POA: Diagnosis not present

## 2022-02-18 DIAGNOSIS — Z79899 Other long term (current) drug therapy: Secondary | ICD-10-CM | POA: Diagnosis not present

## 2022-02-18 DIAGNOSIS — J9811 Atelectasis: Secondary | ICD-10-CM | POA: Diagnosis not present

## 2022-02-18 DIAGNOSIS — R0789 Other chest pain: Secondary | ICD-10-CM | POA: Diagnosis not present

## 2022-02-18 DIAGNOSIS — R079 Chest pain, unspecified: Secondary | ICD-10-CM | POA: Diagnosis not present

## 2022-02-18 DIAGNOSIS — Z886 Allergy status to analgesic agent status: Secondary | ICD-10-CM | POA: Diagnosis not present

## 2022-02-18 DIAGNOSIS — Z888 Allergy status to other drugs, medicaments and biological substances status: Secondary | ICD-10-CM | POA: Diagnosis not present

## 2022-02-18 DIAGNOSIS — Z86711 Personal history of pulmonary embolism: Secondary | ICD-10-CM | POA: Diagnosis not present

## 2022-02-18 DIAGNOSIS — I4519 Other right bundle-branch block: Secondary | ICD-10-CM | POA: Diagnosis not present

## 2022-02-18 DIAGNOSIS — E7800 Pure hypercholesterolemia, unspecified: Secondary | ICD-10-CM | POA: Diagnosis not present

## 2022-02-18 DIAGNOSIS — R918 Other nonspecific abnormal finding of lung field: Secondary | ICD-10-CM | POA: Diagnosis not present

## 2022-02-18 DIAGNOSIS — Z8616 Personal history of COVID-19: Secondary | ICD-10-CM | POA: Diagnosis not present

## 2022-02-18 DIAGNOSIS — K219 Gastro-esophageal reflux disease without esophagitis: Secondary | ICD-10-CM | POA: Diagnosis not present

## 2022-02-25 DIAGNOSIS — R490 Dysphonia: Secondary | ICD-10-CM | POA: Diagnosis not present

## 2022-02-27 DIAGNOSIS — J4550 Severe persistent asthma, uncomplicated: Secondary | ICD-10-CM | POA: Diagnosis not present

## 2022-02-27 DIAGNOSIS — R079 Chest pain, unspecified: Secondary | ICD-10-CM | POA: Diagnosis not present

## 2022-02-27 DIAGNOSIS — I2699 Other pulmonary embolism without acute cor pulmonale: Secondary | ICD-10-CM | POA: Diagnosis not present

## 2022-02-27 DIAGNOSIS — G4733 Obstructive sleep apnea (adult) (pediatric): Secondary | ICD-10-CM | POA: Diagnosis not present

## 2022-03-06 DIAGNOSIS — I2699 Other pulmonary embolism without acute cor pulmonale: Secondary | ICD-10-CM | POA: Diagnosis not present

## 2022-03-19 DIAGNOSIS — K080 Exfoliation of teeth due to systemic causes: Secondary | ICD-10-CM | POA: Diagnosis not present

## 2022-03-26 ENCOUNTER — Encounter: Payer: Self-pay | Admitting: Family Medicine

## 2022-03-26 ENCOUNTER — Ambulatory Visit (INDEPENDENT_AMBULATORY_CARE_PROVIDER_SITE_OTHER): Payer: Medicare Other

## 2022-03-26 ENCOUNTER — Ambulatory Visit (INDEPENDENT_AMBULATORY_CARE_PROVIDER_SITE_OTHER): Payer: Medicare Other | Admitting: Family Medicine

## 2022-03-26 VITALS — BP 107/72 | HR 105 | Temp 96.5°F | Ht 62.0 in | Wt 174.4 lb

## 2022-03-26 DIAGNOSIS — R1031 Right lower quadrant pain: Secondary | ICD-10-CM | POA: Diagnosis not present

## 2022-03-26 DIAGNOSIS — K5901 Slow transit constipation: Secondary | ICD-10-CM

## 2022-03-26 DIAGNOSIS — R109 Unspecified abdominal pain: Secondary | ICD-10-CM | POA: Diagnosis not present

## 2022-03-26 DIAGNOSIS — R350 Frequency of micturition: Secondary | ICD-10-CM

## 2022-03-26 LAB — URINALYSIS, ROUTINE W REFLEX MICROSCOPIC
Bilirubin, UA: NEGATIVE
Glucose, UA: NEGATIVE
Ketones, UA: NEGATIVE
Leukocytes,UA: NEGATIVE
Nitrite, UA: NEGATIVE
Protein,UA: NEGATIVE
RBC, UA: NEGATIVE
Specific Gravity, UA: 1.02 (ref 1.005–1.030)
Urobilinogen, Ur: 0.2 mg/dL (ref 0.2–1.0)
pH, UA: 5.5 (ref 5.0–7.5)

## 2022-03-26 MED ORDER — POLYETHYLENE GLYCOL 3350 17 GM/SCOOP PO POWD: 17.0000 g | Freq: Every day | ORAL | 1 refills | Status: DC

## 2022-03-26 NOTE — Progress Notes (Signed)
Subjective:  Patient ID: Amy Gray, female    DOB: [birth year], 62 y.o.   MRN: [id number]  Patient Care Team: Claretta Fraise, MD as PCP - General (Family Medicine) Dyanne Carrel, MD (Internal Medicine)   Chief Complaint:  Urinary Frequency (X 1 week/) and Abdominal Pain (Lower right abdominal pain for a few days )   HPI: Amy Gray is a 62 y.o. female presenting on 03/26/2022 for Urinary Frequency (X 1 week/) and Abdominal Pain (Lower right abdominal pain for a few days )   Pt presents today for evaluation of urinary frequency and right lower abdominal pain for the last week.   Urinary Frequency  This is a new problem. Episode onset: 1 week ago. The problem occurs intermittently. The problem has been waxing and waning. The patient is experiencing no pain. There has been no fever. She is Not sexually active. There is No history of pyelonephritis. Associated symptoms include frequency. Pertinent negatives include no chills, discharge, flank pain, hematuria, hesitancy, nausea, possible pregnancy, sweats, urgency or vomiting. She has tried nothing for the symptoms.  Abdominal Pain This is a new problem. Episode onset: 1 week ago. The problem occurs intermittently. The problem has been waxing and waning. The pain is located in the RLQ and periumbilical region. The pain is mild. The quality of the pain is colicky, cramping and dull. The abdominal pain does not radiate. Associated symptoms include frequency. Pertinent negatives include no anorexia, arthralgias, belching, constipation, diarrhea, dysuria, fever, flatus, headaches, hematochezia, hematuria, melena, myalgias, nausea, vomiting or weight loss. Nothing aggravates the pain. The pain is relieved by Nothing. She has tried nothing for the symptoms.    Relevant past medical, surgical, family, and social history reviewed and updated as indicated.  Allergies and medications reviewed and updated. Data reviewed: Chart in  Epic.   Past Medical History:  Diagnosis Date   Anxiety    Asthma    COPD (chronic obstructive pulmonary disease) (HCC)    Depression    Emphysema of lung (HCC)    GERD (gastroesophageal reflux disease)    Hyperlipidemia    Migraines    Osteoarthritis    Sleep apnea    Very mild,use for moderate airway collapse    Past Surgical History:  Procedure Laterality Date   ABDOMINAL HYSTERECTOMY     BRAIN SURGERY  05/2016   CERVICAL FUSION  01/20/2013   C4 - C7   RHINOPLASTY     SPINE SURGERY  2015   TUBAL LIGATION  1986    Social History   Socioeconomic History   Marital status: Married    Spouse name: Not on file   Number of children: 2   Years of education: Not on file   Highest education level: 12th grade  Occupational History   Occupation: Disabled  Tobacco Use   Smoking status: Never   Smokeless tobacco: Never  Substance and Sexual Activity   Alcohol use: No   Drug use: No   Sexual activity: Not on file  Other Topics Concern   Not on file  Social History Narrative   Not on file   Social Determinants of Health   Financial Resource Strain: Low Risk  (02/07/2022)   Overall Financial Resource Strain (CARDIA)    Difficulty of Paying Living Expenses: Not hard at all  Food Insecurity: No Food Insecurity (07/31/2021)   Hunger Vital Sign    Worried About Running Out of Food in the Last Year: Never true    Ran Out  of Food in the Last Year: Never true  Transportation Needs: No Transportation Needs (02/07/2022)   PRAPARE - Hydrologist (Medical): No    Lack of Transportation (Non-Medical): No  Physical Activity: Inactive (07/31/2021)   Exercise Vital Sign    Days of Exercise per Week: 0 days    Minutes of Exercise per Session: 0 min  Stress: No Stress Concern Present (07/31/2021)   [place]    Feeling of Stress : Not at all  Social Connections: [place]  (07/31/2021)   Social Connection and Isolation Panel [NHANES]    Frequency of Communication with Friends and Family: More than three times a week    Frequency of Social Gatherings with Friends and Family: Three times a week    Attends Religious Services: More than 4 times per year    Active Member of Clubs or Organizations: Yes    Attends Archivist Meetings: More than 4 times per year    Marital Status: Married  Human resources officer Violence: Not At Risk (07/31/2021)   Humiliation, Afraid, Rape, and Kick questionnaire    Fear of Current or Ex-Partner: No    Emotionally Abused: No    Physically Abused: No    Sexually Abused: No    Outpatient Encounter Medications as of 03/26/2022  Medication Sig   acetaZOLAMIDE (DIAMOX) 250 MG tablet Take 250 mg by mouth daily.   albuterol (PROVENTIL) (2.5 MG/3ML) 0.083% nebulizer solution Take 3 mLs(1 VIAL) (2.5 mg total) by nebulization every 4 (four) hours as needed for wheezing or shortness of breath.   albuterol (VENTOLIN HFA) 108 (90 Base) MCG/ACT inhaler Inhale 2 puffs into the lungs every 6 (six) hours as needed for wheezing or shortness of breath.   apixaban (ELIQUIS) 5 MG TABS tablet Take 5 mg by mouth 2 (two) times daily.   baclofen (LIORESAL) 10 MG tablet Take 10 mg by mouth 3 (three) times daily.   BOTOX 200 units SOLR    Budeson-Glycopyrrol-Formoterol (BREZTRI AEROSPHERE) 160-9-4.8 MCG/ACT AERO Inhale 2 puffs into the lungs in the morning and at bedtime.   budesonide (PULMICORT) 0.25 MG/2ML nebulizer solution Take 2 mLs (0.25 mg total) by nebulization 2 (two) times daily as needed (shortness of breath).   butalbital-acetaminophen-caffeine (FIORICET, ESGIC) 50-325-40 MG tablet    cetirizine (ZYRTEC) 10 MG tablet Take 10 mg by mouth daily.   Cholecalciferol (VITAMIN D) 2000 UNITS CAPS Take 5,000 Units by mouth daily.    cyanocobalamin (,VITAMIN B-12,) 1000 MCG/ML injection Inject 1,000 mcg as directed every 30 (thirty) days.   montelukast  (SINGULAIR) 10 MG tablet Take 1 tablet (10 mg total) by mouth at bedtime.   omeprazole (PRILOSEC) 40 MG capsule Take 1 capsule (40 mg total) by mouth 2 (two) times daily.   polyethylene glycol powder (GLYCOLAX/MIRALAX) 17 GM/SCOOP powder Take 17 g by mouth daily.   prochlorperazine (COMPAZINE) 10 MG tablet Take 10 mg by mouth every 6 (six) hours as needed for nausea or vomiting.   promethazine (PHENERGAN) 12.5 MG tablet Take 12.5 mg by mouth every 8 (eight) hours as needed for nausea or vomiting.   rOPINIRole (REQUIP) 4 MG tablet Take 1 tablet (4 mg total) by mouth at bedtime.   rosuvastatin (CRESTOR) 10 MG tablet Take 1 tablet (10 mg total) by mouth daily.   sertraline (ZOLOFT) 100 MG tablet Take 1 tablet (100 mg total) by mouth at bedtime.   traZODone (DESYREL) 150 MG tablet  TAKE 1/3 (ONE-THIRD) to 1 (ONE) tablet BY MOUTH nightly as needed for sleep.   Ubrogepant (UBRELVY PO) Take by mouth.   zonisamide (ZONEGRAN) 100 MG capsule Take 100 mg by mouth at bedtime.   [DISCONTINUED] ELIQUIS DVT/PE STARTER PACK Take 1 tablet by mouth as directed.   estradiol (ESTRACE) 0.5 MG tablet Take 1 tablet (0.5 mg total) by mouth daily. (Patient not taking: Reported on 02/10/2022)   meclizine (ANTIVERT) 12.5 MG tablet Take 1 tablet (12.5 mg total) by mouth 3 (three) times daily as needed for dizziness. (Patient not taking: Reported on 02/10/2022)   [DISCONTINUED] benzonatate (TESSALON) 200 MG capsule Take 1 capsule (200 mg total) by mouth 3 (three) times daily as needed.   [DISCONTINUED] HYDROcodone bit-homatropine (HYCODAN) 5-1.5 MG/5ML syrup Take 5 mLs by mouth every 8 (eight) hours as needed for cough.   Facility-Administered Encounter Medications as of 03/26/2022  Medication   cyanocobalamin ((VITAMIN B-12)) injection 1,000 mcg    Allergies  Allergen Reactions   Pregabalin Shortness Of Breath   Chlorzoxazone Other (See Comments)    Decrease in BP    Diclofenac Other (See Comments)    Other reaction(s):  Other Other reaction(s): Other Update Update Update Update Other reaction(s): Other Other reaction(s): Other Update Update Update Update    Metoclopramide Other (See Comments)    Restless legs   Sibutramine     Other reaction(s): Other update   Topiramate Other (See Comments)   Venlafaxine Other (See Comments)    Restless legs   Amitriptyline    Duloxetine Hcl    Guaifenesin Er    Oxybutynin    Gabapentin     Other reaction(s): Confusion   Propranolol Anxiety    Review of Systems  Constitutional:  Negative for activity change, appetite change, chills, diaphoresis, fatigue, fever, unexpected weight change and weight loss.  Respiratory:  Negative for cough and shortness of breath.   Cardiovascular:  Negative for chest pain, palpitations and leg swelling.  Gastrointestinal:  Positive for abdominal pain. Negative for abdominal distention, anal bleeding, anorexia, blood in stool, constipation, diarrhea, flatus, hematochezia, melena, nausea, rectal pain and vomiting.  Genitourinary:  Positive for frequency. Negative for decreased urine volume, difficulty urinating, dysuria, enuresis, flank pain, genital sores, hematuria, hesitancy, pelvic pain, urgency, vaginal bleeding and vaginal discharge.  Musculoskeletal:  Negative for arthralgias and myalgias.  Neurological:  Negative for weakness and headaches.  Psychiatric/Behavioral:  Negative for confusion.   All other systems reviewed and are negative.       Objective:  BP 107/72   Pulse (!) 105   Temp (!) 96.5 F (35.8 C) (Temporal)   Ht '5\' 2"'$  (1.575 m)   Wt 174 lb 6.4 oz (79.1 kg)   SpO2 97%   BMI 31.90 kg/m    Wt Readings from Last 3 Encounters:  03/26/22 174 lb 6.4 oz (79.1 kg)  02/10/22 175 lb (79.4 kg)  01/14/22 175 lb (79.4 kg)    Physical Exam Vitals and nursing note reviewed.  Constitutional:      General: She is not in acute distress.    Appearance: Normal appearance. She is well-developed and  well-groomed. She is obese. She is not ill-appearing, toxic-appearing or diaphoretic.  HENT:     Head: Normocephalic and atraumatic.     Jaw: There is normal jaw occlusion.     Right Ear: Hearing normal.     Left Ear: Hearing normal.     Nose: Nose normal.     Mouth/Throat:  Lips: Pink.     Mouth: Mucous membranes are moist.     Pharynx: Oropharynx is clear. Uvula midline.  Eyes:     General: Lids are normal.     Extraocular Movements: Extraocular movements intact.     Conjunctiva/sclera: Conjunctivae normal.     Pupils: Pupils are equal, round, and reactive to light.  Neck:     Thyroid: No thyroid mass, thyromegaly or thyroid tenderness.     Vascular: No carotid bruit or JVD.     Trachea: Trachea and phonation normal.  Cardiovascular:     Rate and Rhythm: Normal rate and regular rhythm.     Chest Wall: PMI is not displaced.     Pulses: Normal pulses.     Heart sounds: Normal heart sounds. No murmur heard.    No friction rub. No gallop.  Pulmonary:     Effort: Pulmonary effort is normal. No respiratory distress.     Breath sounds: Normal breath sounds. No wheezing.  Abdominal:     General: Bowel sounds are normal. There is no distension or abdominal bruit.     Palpations: Abdomen is soft. There is no hepatomegaly or splenomegaly.     Tenderness: There is no abdominal tenderness. There is no right CVA tenderness or left CVA tenderness.     Hernia: No hernia is present.  Musculoskeletal:        General: Normal range of motion.     Cervical back: Normal range of motion and neck supple.     Right lower leg: No edema.     Left lower leg: No edema.  Lymphadenopathy:     Cervical: No cervical adenopathy.  Skin:    General: Skin is warm and dry.     Capillary Refill: Capillary refill takes less than 2 seconds.     Coloration: Skin is not cyanotic, jaundiced or pale.     Findings: No rash.  Neurological:     General: No focal deficit present.     Mental Status: She is alert  and oriented to person, place, and time.     Sensory: Sensation is intact.     Motor: Motor function is intact.     Coordination: Coordination is intact.     Gait: Gait is intact.     Deep Tendon Reflexes: Reflexes are normal and symmetric.  Psychiatric:        Attention and Perception: Attention and perception normal.        Mood and Affect: Mood and affect normal.        Speech: Speech normal.        Behavior: Behavior normal. Behavior is cooperative.        Thought Content: Thought content normal.        Cognition and Memory: Cognition and memory normal.        Judgment: Judgment normal.     Results for orders placed or performed in visit on 03/26/22  Urinalysis, Routine w reflex microscopic  Result Value Ref Range   Specific Gravity, UA 1.020 1.005 - 1.030   pH, UA 5.5 5.0 - 7.5   Color, UA Yellow Yellow   Appearance Ur Clear Clear   Leukocytes,UA Negative Negative   Protein,UA Negative Negative/Trace   Glucose, UA Negative Negative   Ketones, UA Negative Negative   RBC, UA Negative Negative   Bilirubin, UA Negative Negative   Urobilinogen, Ur 0.2 0.2 - 1.0 mg/dL   Nitrite, UA Negative Negative     X-Ray: KUB: significant  stool burden. No acute findings. Preliminary x-ray reading by Monia Pouch, FNP-C, [facility].   Pertinent labs & imaging results that were available during my care of the patient were reviewed by me and considered in my medical decision making.  Assessment & Plan:  Asma was seen today for urinary frequency and abdominal pain.  Diagnoses and all orders for this visit:  Urinary frequency Urinalysis unremarkable. No indication of renal stone on KUB. -     Urinalysis, Routine w reflex microscopic  Right lower quadrant abdominal pain Urinalysis unremarkable. No indications or renal stone on KUB. KUB did reveal significant stool burden.  -     DG Abd 1 View -     BMP8+EGFR -     CBC with Differential/Platelet  Slow transit constipation Miralax  clean out discussed in detail. Pt aware of red flags. Report new, worsening, or persistent symptoms.  -     polyethylene glycol powder (GLYCOLAX/MIRALAX) 17 GM/SCOOP powder; Take 17 g by mouth daily.     Continue all other maintenance medications.  Follow up plan: Return if symptoms worsen or fail to improve.   Continue healthy lifestyle choices, including diet (rich in fruits, vegetables, and lean proteins, and low in salt and simple carbohydrates) and exercise (at least 30 minutes of moderate physical activity daily).  Educational handout given for constipation   The above assessment and management plan was discussed with the patient. The patient verbalized understanding of and has agreed to the management plan. Patient is aware to call the clinic if they develop any new symptoms or if symptoms persist or worsen. Patient is aware when to return to the clinic for a follow-up visit. Patient educated on when it is appropriate to go to the emergency department.   Monia Pouch, FNP-C [facility] [phone]

## 2022-03-26 NOTE — Patient Instructions (Signed)
Thank you for coming in to clinic today.  1. Your symptoms are consistent with Constipation, likely cause of your General Abdominal Pain / Cramping. 2. Start with Miralax, prescription was sent to pharmacy. First dose 68g (4 capfuls) in 32oz water over 1 to 2 hours for clean out. Next day start 17g or 1 capful daily, may adjust dose up or down by half a capful every few days. Recommend to take this medicine daily for next 1-2 weeks, you may need to use it longer if needed. - Goal is to have soft regular bowel movement 1-3x daily, if too runny or diarrhea, then reduce dose of the medicine to every other day.  Improve water intake, hydration will help Also recommend increased vegetables, fruits, fiber intake Can try daily Metamucil or Fiber supplement at pharmacy over the counter  Follow-up if symptoms are not improving with bowel movements, or if pain worsens, develop fevers, nausea, vomiting.  Please schedule a follow-up appointment with Michelle Jahrell Hamor, FNP, in 1 month to follow-up Constipation  If you have any other questions or concerns, please feel free to call the clinic to contact me. You may also schedule an earlier appointment if necessary.  However, if your symptoms get significantly worse, please go to the Emergency Department to seek immediate medical attention.  

## 2022-03-27 LAB — CBC WITH DIFFERENTIAL/PLATELET
Basophils Absolute: 0 10*3/uL (ref 0.0–0.2)
Basos: 1 %
EOS (ABSOLUTE): 0.2 10*3/uL (ref 0.0–0.4)
Eos: 3 %
Hematocrit: 41.1 % (ref 34.0–46.6)
Hemoglobin: 13.4 g/dL (ref 11.1–15.9)
Immature Grans (Abs): 0 10*3/uL (ref 0.0–0.1)
Immature Granulocytes: 1 %
Lymphocytes Absolute: 2.1 10*3/uL (ref 0.7–3.1)
Lymphs: 33 %
MCH: 29.4 pg (ref 26.6–33.0)
MCHC: 32.6 g/dL (ref 31.5–35.7)
MCV: 90 fL (ref 79–97)
Monocytes Absolute: 0.6 10*3/uL (ref 0.1–0.9)
Monocytes: 9 %
Neutrophils Absolute: 3.5 10*3/uL (ref 1.4–7.0)
Neutrophils: 53 %
Platelets: 249 10*3/uL (ref 150–450)
RBC: 4.56 x10E6/uL (ref 3.77–5.28)
RDW: 13.2 % (ref 11.7–15.4)
WBC: 6.5 10*3/uL (ref 3.4–10.8)

## 2022-03-27 LAB — BMP8+EGFR
BUN/Creatinine Ratio: 13 (ref 12–28)
BUN: 15 mg/dL (ref 8–27)
CO2: 18 mmol/L — ABNORMAL LOW (ref 20–29)
Calcium: 9 mg/dL (ref 8.7–10.3)
Chloride: 108 mmol/L — ABNORMAL HIGH (ref 96–106)
Creatinine, Ser: 1.12 mg/dL — ABNORMAL HIGH (ref 0.57–1.00)
Glucose: 88 mg/dL (ref 70–99)
Potassium: 3.8 mmol/L (ref 3.5–5.2)
Sodium: 141 mmol/L (ref 134–144)
eGFR: 56 mL/min/{1.73_m2} — ABNORMAL LOW (ref >59)

## 2022-03-31 ENCOUNTER — Telehealth: Payer: Self-pay | Admitting: Family Medicine

## 2022-03-31 NOTE — Telephone Encounter (Signed)
Pt saw [facility] last week and UA neg and KUB negative for stones but did show significant stool burden. No urine culture was ordered due to normal UA. Please advise.

## 2022-03-31 NOTE — Telephone Encounter (Signed)
Patient is still having issues going to the bathroom often, she would like to know if there is anything else that can be done. Please call back and advise.

## 2022-03-31 NOTE — Telephone Encounter (Signed)
I have a couple of Same day openings for Wednesday. Please put her in one of those.

## 2022-04-01 NOTE — Telephone Encounter (Signed)
APPOINTMENT GIVEN FOR [facility]

## 2022-04-02 ENCOUNTER — Ambulatory Visit (INDEPENDENT_AMBULATORY_CARE_PROVIDER_SITE_OTHER): Payer: Medicare Other | Admitting: Family Medicine

## 2022-04-02 ENCOUNTER — Telehealth: Payer: Self-pay | Admitting: Family Medicine

## 2022-04-02 ENCOUNTER — Encounter: Payer: Self-pay | Admitting: Family Medicine

## 2022-04-02 VITALS — BP 117/69 | HR 89 | Temp 97.4°F | Ht 62.0 in | Wt 173.0 lb

## 2022-04-02 DIAGNOSIS — G932 Benign intracranial hypertension: Secondary | ICD-10-CM | POA: Diagnosis not present

## 2022-04-02 DIAGNOSIS — R1030 Lower abdominal pain, unspecified: Secondary | ICD-10-CM

## 2022-04-02 DIAGNOSIS — R102 Pelvic and perineal pain: Secondary | ICD-10-CM | POA: Diagnosis not present

## 2022-04-02 DIAGNOSIS — G43719 Chronic migraine without aura, intractable, without status migrainosus: Secondary | ICD-10-CM | POA: Diagnosis not present

## 2022-04-02 DIAGNOSIS — G43709 Chronic migraine without aura, not intractable, without status migrainosus: Secondary | ICD-10-CM | POA: Diagnosis not present

## 2022-04-02 LAB — URINALYSIS
Bilirubin, UA: NEGATIVE
Glucose, UA: NEGATIVE
Ketones, UA: NEGATIVE
Leukocytes,UA: NEGATIVE
Nitrite, UA: NEGATIVE
Protein,UA: NEGATIVE
RBC, UA: NEGATIVE
Specific Gravity, UA: 1.025 (ref 1.005–1.030)
Urobilinogen, Ur: 0.2 mg/dL (ref 0.2–1.0)
pH, UA: 5.5 (ref 5.0–7.5)

## 2022-04-02 NOTE — Telephone Encounter (Signed)
Pt called stating that she needs her appt at [facility] for CT cancelled because her insurance is not in network with [employer]. Needs CT appt scheduled with [facility] facility.  Please call patient back with update.

## 2022-04-02 NOTE — Progress Notes (Signed)
Subjective:  Patient ID: Amy Gray, female    DOB: 09-25-1960  Age: 62 y.o. MRN: [id number]  CC: Abdominal Pain (RIGHT LOWER OFF AND ON X 1 WEEK)   HPI [name] presents for recurring chronic abdominal pain. Three in the last week. Affecting suprapubic to RLQ area. Moderately severe. No vomiting or diarrhea.      04/02/2022    2:22 PM 02/10/2022    8:22 AM 01/14/2022    1:49 PM  Depression screen PHQ 2/9  Decreased Interest 0 0 0  Down, Depressed, Hopeless 0 0 0  PHQ - 2 Score 0 0 0  Altered sleeping  0   Tired, decreased energy  0   Change in appetite  0   Feeling bad or failure about yourself   0   Trouble concentrating  0   Moving slowly or fidgety/restless  0   Suicidal thoughts  0   PHQ-9 Score  0     History Shalia has a past medical history of Anxiety, Asthma, COPD (chronic obstructive pulmonary disease) ([place]), Depression, Emphysema of lung ([place]), GERD (gastroesophageal reflux disease), Hyperlipidemia, Migraines, Osteoarthritis, and Sleep apnea.   She has a past surgical history that includes Cervical fusion (01/20/2013); Rhinoplasty; Abdominal hysterectomy; Brain surgery (05/2016); Spine surgery (2015); and Tubal ligation (1986).   Her family history includes Arthritis in her father and mother; Asthma in her mother; Heart attack in her father; Heart disease in her father and mother; Hypertension in her sister.She reports that she has never smoked. She has never used smokeless tobacco. She reports that she does not drink alcohol and does not use drugs.    ROS Review of Systems  Constitutional: Negative.  Negative for appetite change.  HENT: Negative.    Eyes:  Negative for visual disturbance.  Respiratory:  Negative for shortness of breath.   Cardiovascular:  Negative for chest pain.  Gastrointestinal:  Positive for abdominal distention and abdominal pain.  Musculoskeletal:  Negative for arthralgias.    Objective:  BP 117/69   Pulse 89   Temp  (!) 97.4 F (36.3 C)   Ht 5\' 2"  (1.575 m)   Wt 173 lb (78.5 kg)   SpO2 97%   BMI 31.64 kg/m   BP Readings from Last 3 Encounters:  04/02/22 117/69  03/26/22 107/72  02/10/22 106/68    Wt Readings from Last 3 Encounters:  04/02/22 173 lb (78.5 kg)  03/26/22 174 lb 6.4 oz (79.1 kg)  02/10/22 175 lb (79.4 kg)     Physical Exam Constitutional:      General: She is not in acute distress.    Appearance: She is well-developed.  Cardiovascular:     Rate and Rhythm: Normal rate and regular rhythm.  Pulmonary:     Breath sounds: Normal breath sounds.  Abdominal:     Tenderness: There is abdominal tenderness in the right lower quadrant and suprapubic area. There is no rebound. Negative signs include Murphy's sign, McBurney's sign and psoas sign.  Musculoskeletal:        General: Normal range of motion.  Skin:    General: Skin is warm and dry.  Neurological:     Mental Status: She is alert and oriented to person, place, and time.       Assessment & Plan:   Aileth was seen today for abdominal pain.  Diagnoses and all orders for this visit:  Suprapubic abdominal pain -     Urinalysis -     Urine Culture -  DG Abd 2 Views; Future  Lower abdominal pain -     CT ABDOMEN PELVIS W CONTRAST; Future       I am having Shirlee Latch maintain her Vitamin D, acetaZOLAMIDE, Botox, butalbital-acetaminophen-caffeine, Ubrogepant (UBRELVY PO), cetirizine, promethazine, prochlorperazine, meclizine, cyanocobalamin, Breztri Aerosphere, rosuvastatin, zonisamide, baclofen, traZODone, sertraline, estradiol, budesonide, albuterol, omeprazole, albuterol, rOPINIRole, montelukast, apixaban, and polyethylene glycol powder. We will continue to administer cyanocobalamin.  Allergies as of 04/02/2022       Reactions   Pregabalin Shortness Of Breath   Chlorzoxazone Other (See Comments)   Decrease in BP    Diclofenac Other (See Comments)   Other reaction(s): Other Other reaction(s):  Other Update Update Update Update Other reaction(s): Other Other reaction(s): Other Update Update Update Update   Metoclopramide Other (See Comments)   Restless legs   Sibutramine    Other reaction(s): Other update   Topiramate Other (See Comments)   Venlafaxine Other (See Comments)   Restless legs   Amitriptyline    Duloxetine Hcl    Guaifenesin Er    Oxybutynin    Gabapentin    Other reaction(s): Confusion   Propranolol Anxiety        Medication List        Accurate as of April 02, 2022 11:59 PM. If you have any questions, ask your nurse or doctor.          acetaZOLAMIDE 250 MG tablet Commonly known as: DIAMOX Take 250 mg by mouth daily.   albuterol 108 (90 Base) MCG/ACT inhaler Commonly known as: VENTOLIN HFA Inhale 2 puffs into the lungs every 6 (six) hours as needed for wheezing or shortness of breath.   albuterol (2.5 MG/3ML) 0.083% nebulizer solution Commonly known as: PROVENTIL Take 3 mLs(1 VIAL) (2.5 mg total) by nebulization every 4 (four) hours as needed for wheezing or shortness of breath.   apixaban 5 MG Tabs tablet Commonly known as: ELIQUIS Take 5 mg by mouth 2 (two) times daily.   baclofen 10 MG tablet Commonly known as: LIORESAL Take 10 mg by mouth 3 (three) times daily.   Botox 200 units injection Generic drug: botulinum toxin Type A   Breztri Aerosphere 160-9-4.8 MCG/ACT Aero Generic drug: Budeson-Glycopyrrol-Formoterol Inhale 2 puffs into the lungs in the morning and at bedtime.   budesonide 0.25 MG/2ML nebulizer solution Commonly known as: PULMICORT Take 2 mLs (0.25 mg total) by nebulization 2 (two) times daily as needed (shortness of breath).   butalbital-acetaminophen-caffeine 50-325-40 MG tablet Commonly known as: FIORICET   cetirizine 10 MG tablet Commonly known as: ZYRTEC Take 10 mg by mouth daily.   cyanocobalamin 1000 MCG/ML injection Commonly known as: VITAMIN B12 Inject 1,000 mcg as directed every 30  (thirty) days.   estradiol 0.5 MG tablet Commonly known as: ESTRACE Take 1 tablet (0.5 mg total) by mouth daily.   meclizine 12.5 MG tablet Commonly known as: ANTIVERT Take 1 tablet (12.5 mg total) by mouth 3 (three) times daily as needed for dizziness.   montelukast 10 MG tablet Commonly known as: SINGULAIR Take 1 tablet (10 mg total) by mouth at bedtime.   omeprazole 40 MG capsule Commonly known as: PRILOSEC Take 1 capsule (40 mg total) by mouth 2 (two) times daily.   polyethylene glycol powder 17 GM/SCOOP powder Commonly known as: GLYCOLAX/MIRALAX Take 17 g by mouth daily.   prochlorperazine 10 MG tablet Commonly known as: COMPAZINE Take 10 mg by mouth every 6 (six) hours as needed for nausea or vomiting.   promethazine 12.5  MG tablet Commonly known as: PHENERGAN Take 12.5 mg by mouth every 8 (eight) hours as needed for nausea or vomiting.   rOPINIRole 4 MG tablet Commonly known as: REQUIP Take 1 tablet (4 mg total) by mouth at bedtime.   rosuvastatin 10 MG tablet Commonly known as: CRESTOR Take 1 tablet (10 mg total) by mouth daily.   sertraline 100 MG tablet Commonly known as: ZOLOFT Take 1 tablet (100 mg total) by mouth at bedtime.   traZODone 150 MG tablet Commonly known as: DESYREL TAKE 1/3 (ONE-THIRD) to 1 (ONE) tablet BY MOUTH nightly as needed for sleep.   UBRELVY PO Take by mouth.   Vitamin D 50 MCG (2000 UT) Caps Take 5,000 Units by mouth daily.   zonisamide 100 MG capsule Commonly known as: ZONEGRAN Take 100 mg by mouth at bedtime.         Follow-up: Return if symptoms worsen or fail to improve.  Claretta Fraise, M.D.

## 2022-04-03 ENCOUNTER — Ambulatory Visit (INDEPENDENT_AMBULATORY_CARE_PROVIDER_SITE_OTHER): Payer: Medicare Other

## 2022-04-03 ENCOUNTER — Ambulatory Visit (HOSPITAL_COMMUNITY): Admission: RE | Admit: 2022-04-03 | Payer: Medicare Other | Source: Ambulatory Visit

## 2022-04-03 DIAGNOSIS — R102 Pelvic and perineal pain: Secondary | ICD-10-CM | POA: Diagnosis not present

## 2022-04-03 DIAGNOSIS — R109 Unspecified abdominal pain: Secondary | ICD-10-CM | POA: Diagnosis not present

## 2022-04-04 LAB — URINE CULTURE

## 2022-04-06 ENCOUNTER — Encounter: Payer: Self-pay | Admitting: Family Medicine

## 2022-04-07 ENCOUNTER — Ambulatory Visit (INDEPENDENT_AMBULATORY_CARE_PROVIDER_SITE_OTHER): Payer: Medicare Other

## 2022-04-07 DIAGNOSIS — R1030 Lower abdominal pain, unspecified: Secondary | ICD-10-CM

## 2022-04-07 DIAGNOSIS — R1031 Right lower quadrant pain: Secondary | ICD-10-CM

## 2022-04-07 MED ORDER — IOHEXOL 300 MG/ML  SOLN
100.0000 mL | Freq: Once | INTRAMUSCULAR | Status: AC | PRN
  Administered 2022-04-07: 100 mL via INTRAVENOUS

## 2022-04-30 DIAGNOSIS — J8283 Eosinophilic asthma: Secondary | ICD-10-CM | POA: Diagnosis not present

## 2022-04-30 DIAGNOSIS — J4550 Severe persistent asthma, uncomplicated: Secondary | ICD-10-CM | POA: Diagnosis not present

## 2022-04-30 DIAGNOSIS — Z86711 Personal history of pulmonary embolism: Secondary | ICD-10-CM | POA: Diagnosis not present

## 2022-04-30 DIAGNOSIS — J479 Bronchiectasis, uncomplicated: Secondary | ICD-10-CM | POA: Diagnosis not present

## 2022-04-30 DIAGNOSIS — J398 Other specified diseases of upper respiratory tract: Secondary | ICD-10-CM | POA: Diagnosis not present

## 2022-05-08 DIAGNOSIS — G43719 Chronic migraine without aura, intractable, without status migrainosus: Secondary | ICD-10-CM | POA: Diagnosis not present

## 2022-05-13 ENCOUNTER — Encounter: Payer: Self-pay | Admitting: Family Medicine

## 2022-05-13 ENCOUNTER — Ambulatory Visit (INDEPENDENT_AMBULATORY_CARE_PROVIDER_SITE_OTHER): Payer: Medicare Other | Admitting: Family Medicine

## 2022-05-13 VITALS — BP 93/67 | HR 84 | Temp 98.0°F | Ht 62.0 in | Wt 171.8 lb

## 2022-05-13 DIAGNOSIS — G2581 Restless legs syndrome: Secondary | ICD-10-CM | POA: Diagnosis not present

## 2022-05-13 DIAGNOSIS — I700 Atherosclerosis of aorta: Secondary | ICD-10-CM | POA: Diagnosis not present

## 2022-05-13 DIAGNOSIS — M797 Fibromyalgia: Secondary | ICD-10-CM

## 2022-05-13 DIAGNOSIS — E538 Deficiency of other specified B group vitamins: Secondary | ICD-10-CM

## 2022-05-13 DIAGNOSIS — Z1231 Encounter for screening mammogram for malignant neoplasm of breast: Secondary | ICD-10-CM

## 2022-05-13 DIAGNOSIS — K2100 Gastro-esophageal reflux disease with esophagitis, without bleeding: Secondary | ICD-10-CM | POA: Diagnosis not present

## 2022-05-13 DIAGNOSIS — E782 Mixed hyperlipidemia: Secondary | ICD-10-CM

## 2022-05-13 LAB — CBC WITH DIFFERENTIAL/PLATELET
Basophils Absolute: 0 10*3/uL (ref 0.0–0.2)
EOS (ABSOLUTE): 0.1 10*3/uL (ref 0.0–0.4)
Hemoglobin: 12.9 g/dL (ref 11.1–15.9)
Lymphocytes Absolute: 1.7 10*3/uL (ref 0.7–3.1)
MCH: 29.5 pg (ref 26.6–33.0)
Monocytes: 10 %

## 2022-05-13 LAB — CMP14+EGFR

## 2022-05-13 MED ORDER — CYANOCOBALAMIN 1000 MCG/ML IJ SOLN: 1000.0000 ug | INTRAMUSCULAR | 3 refills | Status: DC

## 2022-05-13 MED ORDER — BREZTRI AEROSPHERE 160-9-4.8 MCG/ACT IN AERO: 2.0000 | INHALATION_SPRAY | Freq: Two times a day (BID) | RESPIRATORY_TRACT | 11 refills | Status: DC

## 2022-05-13 MED ORDER — ROPINIROLE HCL 4 MG PO TABS
ORAL_TABLET | ORAL | 3 refills | Status: DC
Start: 2022-05-13 — End: 2023-03-17

## 2022-05-13 MED ORDER — ROSUVASTATIN CALCIUM 10 MG PO TABS: 10.0000 mg | ORAL_TABLET | Freq: Every day | ORAL | 3 refills | Status: DC

## 2022-05-13 MED ORDER — OMEPRAZOLE 40 MG PO CPDR
40.0000 mg | DELAYED_RELEASE_CAPSULE | Freq: Two times a day (BID) | ORAL | 3 refills | Status: DC
Start: 2022-05-13 — End: 2023-03-17

## 2022-05-13 NOTE — Progress Notes (Signed)
Subjective:  Patient ID: Amy Gray, female    DOB: 08/06/1960  Age: 62 y.o. MRN: [id number]  CC: Medical Management of Chronic Issues   HPI Amy Gray presents for migraines flaring. Can't take the hormones anymore due to PE. HA worse at night. Neuro doing botox. Considering referral to [facility]. Wants to wait. Botox helps. Also neuro suggested increasing zonegran.   Off eliquis 2 weeks now. Due for D Dimer.      05/13/2022    8:56 AM 04/02/2022    2:22 PM 02/10/2022    8:22 AM  Depression screen PHQ 2/9  Decreased Interest 0 0 0  Down, Depressed, Hopeless 0 0 0  PHQ - 2 Score 0 0 0  Altered sleeping   0  Tired, decreased energy   0  Change in appetite   0  Feeling bad or failure about yourself    0  Trouble concentrating   0  Moving slowly or fidgety/restless   0  Suicidal thoughts   0  PHQ-9 Score   0    History Amy Gray has a past medical history of Anxiety, Asthma, COPD (chronic obstructive pulmonary disease), Depression, Emphysema of lung, GERD (gastroesophageal reflux disease), Hyperlipidemia, Migraines, Osteoarthritis, and Sleep apnea.   She has a past surgical history that includes Cervical fusion (01/20/2013); Rhinoplasty; Abdominal hysterectomy; Brain surgery (05/2016); Spine surgery (2015); and Tubal ligation (1986).   Her family history includes Arthritis in her father and mother; Asthma in her mother; Heart attack in her father; Heart disease in her father and mother; Hypertension in her sister.She reports that she has never smoked. She has never used smokeless tobacco. She reports that she does not drink alcohol and does not use drugs.    ROS Review of Systems  Constitutional: Negative.   HENT:  Negative for congestion.   Eyes:  Negative for visual disturbance.  Respiratory:  Negative for shortness of breath.   Cardiovascular:  Negative for chest pain.  Gastrointestinal:  Negative for abdominal pain, constipation, diarrhea, nausea and vomiting.   Genitourinary:  Negative for difficulty urinating.  Musculoskeletal:  Negative for arthralgias and myalgias.  Neurological:  Positive for headaches.  Psychiatric/Behavioral:  Negative for sleep disturbance.     Objective:  BP 93/67   Pulse 84   Temp 98 F (36.7 C)   Ht  (1.575 m)   Wt 171 lb 12.8 oz (77.9 kg)   SpO2 93%   BMI 31.42 kg/m   BP Readings from Last 3 Encounters:  05/13/22 93/67  04/02/22 117/69  03/26/22 107/72    Wt Readings from Last 3 Encounters:  05/13/22 171 lb 12.8 oz (77.9 kg)  04/02/22 173 lb (78.5 kg)  03/26/22 174 lb 6.4 oz (79.1 kg)     Physical Exam Constitutional:      General: She is not in acute distress.    Appearance: She is well-developed.  Cardiovascular:     Rate and Rhythm: Normal rate and regular rhythm.  Pulmonary:     Breath sounds: Normal breath sounds.  Musculoskeletal:        General: Normal range of motion.  Skin:    General: Skin is warm and dry.  Neurological:     Mental Status: She is alert and oriented to person, place, and time.       Assessment & Plan:   Amy Gray was seen today for medical management of chronic issues.  Diagnoses and all orders for this visit:  Abdominal aortic atherosclerosis -  CBC with Differential/Platelet -     CMP14+EGFR  Gastroesophageal reflux disease with esophagitis without hemorrhage -     omeprazole (PRILOSEC) 40 MG capsule; Take 1 capsule (40 mg total) by mouth 2 (two) times daily. -     CBC with Differential/Platelet -     CMP14+EGFR  Fibromyalgia syndrome -     rOPINIRole (REQUIP) 4 MG tablet; Take 1 tablet (4 mg total) by mouth at bedtime. -     CBC with Differential/Platelet -     CMP14+EGFR  Restless leg syndrome, uncontrolled -     rOPINIRole (REQUIP) 4 MG tablet; Take 1 tablet (4 mg total) by mouth at bedtime. -     CBC with Differential/Platelet -     CMP14+EGFR  Mixed hyperlipidemia -     rosuvastatin (CRESTOR) 10 MG tablet; Take 1 tablet (10 mg  total) by mouth daily. -     CBC with Differential/Platelet -     CMP14+EGFR -     Lipid panel  Vitamin B12 deficiency -     cyanocobalamin (VITAMIN B12) 1000 MCG/ML injection; Inject 1 mL (1,000 mcg total) into the skin every 30 (thirty) days. -     CBC with Differential/Platelet -     CMP14+EGFR  Encounter for screening mammogram for malignant neoplasm of breast -     MM 3D SCREENING MAMMOGRAM BILATERAL BREAST; Future  Other orders -     Budeson-Glycopyrrol-Formoterol (BREZTRI AEROSPHERE) 160-9-4.8 MCG/ACT AERO; Inhale 2 puffs into the lungs in the morning and at bedtime.       I have discontinued Amy Gray's butalbital-acetaminophen-caffeine, cetirizine, promethazine, meclizine, estradiol, and apixaban. I have also changed her cyanocobalamin. Additionally, I am having her maintain her Vitamin D, acetaZOLAMIDE, Botox, Ubrogepant (UBRELVY PO), prochlorperazine, zonisamide, baclofen, traZODone, sertraline, budesonide, albuterol, montelukast, polyethylene glycol powder, Breztri Aerosphere, omeprazole, rOPINIRole, and rosuvastatin. We will stop administering cyanocobalamin.  Allergies as of 05/13/2022       Reactions   Pregabalin Shortness Of Breath   Chlorzoxazone Other (See Comments)   Decrease in BP    Diclofenac Other (See Comments)   Other reaction(s): Other Other reaction(s): Other Update Update Update Update Other reaction(s): Other Other reaction(s): Other Update Update Update Update   Metoclopramide Other (See Comments)   Restless legs   Sibutramine    Other reaction(s): Other update   Topiramate Other (See Comments)   Venlafaxine Other (See Comments)   Restless legs   Amitriptyline    Duloxetine Hcl    Guaifenesin Er    Oxybutynin    Gabapentin    Other reaction(s): Confusion   Propranolol Anxiety        Medication List        Accurate as of May 13, 2022  5:59 PM. If you have any questions, ask your nurse or doctor.          STOP  taking these medications    apixaban 5 MG Tabs tablet Commonly known as: ELIQUIS Stopped by: Mechele Claude, MD   butalbital-acetaminophen-caffeine [phone] MG tablet Commonly known as: FIORICET Stopped by: Mechele Claude, MD   cetirizine 10 MG tablet Commonly known as: ZYRTEC Stopped by: Mechele Claude, MD   estradiol 0.5 MG tablet Commonly known as: ESTRACE Stopped by: Mechele Claude, MD   meclizine 12.5 MG tablet Commonly known as: ANTIVERT Stopped by: Mechele Claude, MD   promethazine 12.5 MG tablet Commonly known as: PHENERGAN Stopped by: Mechele Claude, MD       TAKE these medications  acetaZOLAMIDE 250 MG tablet Commonly known as: DIAMOX Take 250 mg by mouth daily.   albuterol 108 (90 Base) MCG/ACT inhaler Commonly known as: VENTOLIN HFA Inhale 2 puffs into the lungs every 6 (six) hours as needed for wheezing or shortness of breath. What changed: Another medication with the same name was removed. Continue taking this medication, and follow the directions you see here. Changed by: Mechele Claude, MD   baclofen 10 MG tablet Commonly known as: LIORESAL Take 10 mg by mouth 3 (three) times daily.   Botox 200 units injection Generic drug: botulinum toxin Type A   Breztri Aerosphere 160-9-4.8 MCG/ACT Aero Generic drug: Budeson-Glycopyrrol-Formoterol Inhale 2 puffs into the lungs in the morning and at bedtime.   budesonide 0.25 MG/2ML nebulizer solution Commonly known as: PULMICORT Take 2 mLs (0.25 mg total) by nebulization 2 (two) times daily as needed (shortness of breath).   cyanocobalamin 1000 MCG/ML injection Commonly known as: VITAMIN B12 Inject 1 mL (1,000 mcg total) into the skin every 30 (thirty) days. What changed: See the new instructions. Changed by: Mechele Claude, MD   montelukast 10 MG tablet Commonly known as: SINGULAIR Take 1 tablet (10 mg total) by mouth at bedtime.   omeprazole 40 MG capsule Commonly known as: PRILOSEC Take 1 capsule  (40 mg total) by mouth 2 (two) times daily.   polyethylene glycol powder 17 GM/SCOOP powder Commonly known as: GLYCOLAX/MIRALAX Take 17 g by mouth daily.   prochlorperazine 10 MG tablet Commonly known as: COMPAZINE Take 10 mg by mouth every 6 (six) hours as needed for nausea or vomiting.   rOPINIRole 4 MG tablet Commonly known as: REQUIP Take 1 tablet (4 mg total) by mouth at bedtime.   rosuvastatin 10 MG tablet Commonly known as: CRESTOR Take 1 tablet (10 mg total) by mouth daily.   sertraline 100 MG tablet Commonly known as: ZOLOFT Take 1 tablet (100 mg total) by mouth at bedtime.   traZODone 150 MG tablet Commonly known as: DESYREL TAKE 1/3 (ONE-THIRD) to 1 (ONE) tablet BY MOUTH nightly as needed for sleep.   UBRELVY PO Take by mouth.   Vitamin D 50 MCG (2000 UT) Caps Take 5,000 Units by mouth daily.   zonisamide 100 MG capsule Commonly known as: ZONEGRAN Take 100 mg by mouth at bedtime.         Follow-up: Return in about 3 months (around 08/12/2022).  Mechele Claude, M.D.

## 2022-05-14 LAB — LIPID PANEL
Chol/HDL Ratio: 4.6 ratio — ABNORMAL HIGH (ref 0.0–4.4)
Cholesterol, Total: 259 mg/dL — ABNORMAL HIGH (ref 100–199)
HDL: 56 mg/dL (ref >39)
LDL Chol Calc (NIH): 175 mg/dL — ABNORMAL HIGH (ref 0–99)
Triglycerides: 154 mg/dL — ABNORMAL HIGH (ref 0–149)
VLDL Cholesterol Cal: 28 mg/dL (ref 5–40)

## 2022-05-14 LAB — CBC WITH DIFFERENTIAL/PLATELET
Basos: 1 %
Eos: 2 %
Hematocrit: 39.3 % (ref 34.0–46.6)
Immature Grans (Abs): 0 10*3/uL (ref 0.0–0.1)
Immature Granulocytes: 0 %
Lymphs: 31 %
MCHC: 32.8 g/dL (ref 31.5–35.7)
MCV: 90 fL (ref 79–97)
Monocytes Absolute: 0.6 10*3/uL (ref 0.1–0.9)
Neutrophils Absolute: 3.1 10*3/uL (ref 1.4–7.0)
Neutrophils: 56 %
Platelets: 266 10*3/uL (ref 150–450)
RBC: 4.38 x10E6/uL (ref 3.77–5.28)
RDW: 13.4 % (ref 11.7–15.4)
WBC: 5.6 10*3/uL (ref 3.4–10.8)

## 2022-05-14 LAB — CMP14+EGFR
AST: 21 IU/L (ref 0–40)
Albumin/Globulin Ratio: 1.8 (ref 1.2–2.2)
BUN/Creatinine Ratio: 20 (ref 12–28)
BUN: 17 mg/dL (ref 8–27)
Bilirubin Total: 0.3 mg/dL (ref 0.0–1.2)
CO2: 20 mmol/L (ref 20–29)
Chloride: 108 mmol/L — ABNORMAL HIGH (ref 96–106)
Globulin, Total: 2.3 g/dL (ref 1.5–4.5)
Glucose: 87 mg/dL (ref 70–99)
Potassium: 3.9 mmol/L (ref 3.5–5.2)
eGFR: 77 mL/min/{1.73_m2} (ref >59)

## 2022-05-14 NOTE — Progress Notes (Signed)
Pt r/c.

## 2022-05-20 DIAGNOSIS — H2513 Age-related nuclear cataract, bilateral: Secondary | ICD-10-CM | POA: Diagnosis not present

## 2022-05-20 DIAGNOSIS — H524 Presbyopia: Secondary | ICD-10-CM | POA: Diagnosis not present

## 2022-05-24 ENCOUNTER — Other Ambulatory Visit: Payer: Self-pay | Admitting: Family Medicine

## 2022-05-24 DIAGNOSIS — J45909 Unspecified asthma, uncomplicated: Secondary | ICD-10-CM

## 2022-06-02 ENCOUNTER — Ambulatory Visit
Admission: RE | Admit: 2022-06-02 | Discharge: 2022-06-02 | Disposition: A | Discharge status: Home / Self Care | Payer: Medicare Other | Source: Ambulatory Visit | Attending: Family Medicine | Admitting: Family Medicine

## 2022-06-02 DIAGNOSIS — Z1231 Encounter for screening mammogram for malignant neoplasm of breast: Secondary | ICD-10-CM

## 2022-06-04 DIAGNOSIS — I2699 Other pulmonary embolism without acute cor pulmonale: Secondary | ICD-10-CM | POA: Diagnosis not present

## 2022-06-04 DIAGNOSIS — Z8249 Family history of ischemic heart disease and other diseases of the circulatory system: Secondary | ICD-10-CM | POA: Diagnosis not present

## 2022-06-05 ENCOUNTER — Telehealth: Payer: Self-pay

## 2022-06-05 NOTE — Telephone Encounter (Signed)
Patient called in stating that she was having numbness and tingling through her body that has been going on x 1 week.  States when it normally happens she needs her b12 shot.  States her daughter gave her her b12 shot on Sunday and she is no better.  On Tuesday her numbness was primarily on the right side of her body.  Patient states she seen oncology and they advised her to go to ER to get evaluated for stroke.  Patient states she did not go to ER as advised by provider.  Today she is still having the numbness primarily on the right side of her face, arms, hands and legs.  States she is having a little on the left in her fingers.  I advised patient to go to ER sine she was advised to go by oncology- patient declined wanting to know if labs could be put in or if she can be seen in office.  Patient aware we have no openings and labs can not be put in without seeing a provider.  Advised patient again to go to ER since she is having these symptoms and was already advised to go by oncology.  Patient then said thank you and hung the phone up.    Will send to PCP ad [facility]

## 2022-06-05 NOTE — Telephone Encounter (Signed)
I agree she needs to go to ED

## 2022-06-06 ENCOUNTER — Encounter: Payer: Self-pay | Admitting: Family Medicine

## 2022-06-06 ENCOUNTER — Telehealth (INDEPENDENT_AMBULATORY_CARE_PROVIDER_SITE_OTHER): Payer: Medicare Other | Admitting: Family Medicine

## 2022-06-06 DIAGNOSIS — R202 Paresthesia of skin: Secondary | ICD-10-CM | POA: Diagnosis not present

## 2022-06-06 NOTE — Progress Notes (Signed)
MyChart Video visit  Subjective: [id number]/ itching PCP: Mechele Claude, MD [id number] [name] is a 62 y.o. female. Patient provides verbal consent for consult held via video.  Due to COVID-19 pandemic this visit was conducted virtually. This visit type was conducted due to national recommendations for restrictions regarding the COVID-19 Pandemic (e.g. social distancing, sheltering in place) in an effort to limit this patient's exposure and mitigate transmission in our community. All issues noted in this document were discussed and addressed.  A physical exam was not performed with this format.   Location of patient: sitting in parked car Location of provider: [facility] Others present for call: none  1.  Numbness, tingling Patient reports over 1 week history of numbness, tingling in multiple extremities and various parts of her body.  It is not consistent and it seems to be migratory.  She notes that sometimes the tingling feels itchy.  She has not had any new medications.  She recently had labs drawn for blood counts given the history of clot.  She has a shunt in place in her brain but denies any lower extremity weakness, which is what that initially presented with.  She does report some intermittent right upper extremity weakness.  She has not had imaging of her brain in several years and notes that there is some special process that needs to be done because of the shunt in her brain.  At first she thought perhaps she had B12 issues so her daughter gave her an intramuscular B12 injection but symptoms have not resolved.  No reports of visual disturbance, difficulty with speech or swallowing.  No recent illness.  No history of demyelinating disease but she does report that an MRI several years ago showed some demyelination but it was thought to be perhaps an over read?  She has not contacted her neurologist or neurosurgeon with regards to these new symptoms   ROS: Per HPI  Allergies  Allergen  Reactions   Pregabalin Shortness Of Breath   Chlorzoxazone Other (See Comments)    Decrease in BP    Diclofenac Other (See Comments)    Other reaction(s): Other Other reaction(s): Other Update Update Update Update Other reaction(s): Other Other reaction(s): Other Update Update Update Update    Metoclopramide Other (See Comments)    Restless legs   Sibutramine     Other reaction(s): Other update   Topiramate Other (See Comments)   Venlafaxine Other (See Comments)    Restless legs   Amitriptyline    Duloxetine Hcl    Guaifenesin Er    Oxybutynin    Gabapentin     Other reaction(s): Confusion   Propranolol Anxiety   Past Medical History:  Diagnosis Date   Anxiety    Asthma    COPD (chronic obstructive pulmonary disease) ([facility])    Depression    Emphysema of lung ([facility])    GERD (gastroesophageal reflux disease)    Hyperlipidemia    Migraines    Osteoarthritis    Sleep apnea    Very mild,use for moderate airway collapse    Current Outpatient Medications:    acetaZOLAMIDE (DIAMOX) 250 MG tablet, Take 250 mg by mouth daily., Disp: , Rfl:    albuterol (VENTOLIN HFA) 108 (90 Base) MCG/ACT inhaler, Inhale 2 puffs into the lungs every 6 (six) hours as needed for wheezing or shortness of breath., Disp: 8.5 g, Rfl: 11   baclofen (LIORESAL) 10 MG tablet, Take 10 mg by mouth 3 (three) times daily., Disp: , Rfl:  BOTOX 200 units SOLR, , Disp: , Rfl:    Budeson-Glycopyrrol-Formoterol (BREZTRI AEROSPHERE) 160-9-4.8 MCG/ACT AERO, Inhale 2 puffs into the lungs in the morning and at bedtime., Disp: 10.7 g, Rfl: 11   budesonide (PULMICORT) 0.25 MG/2ML nebulizer solution, Take 2 mLs (0.25 mg total) by nebulization 2 (two) times daily as needed (shortness of breath)., Disp: 60 mL, Rfl: 12   Cholecalciferol (VITAMIN D) 2000 UNITS CAPS, Take 5,000 Units by mouth daily. , Disp: , Rfl:    cyanocobalamin (VITAMIN B12) 1000 MCG/ML injection, Inject 1 mL (1,000 mcg total) into the skin every  30 (thirty) days., Disp: 3 mL, Rfl: 3   montelukast (SINGULAIR) 10 MG tablet, Take 1 tablet (10 mg total) by mouth at bedtime., Disp: 90 tablet, Rfl: 3   omeprazole (PRILOSEC) 40 MG capsule, Take 1 capsule (40 mg total) by mouth 2 (two) times daily., Disp: 180 capsule, Rfl: 3   polyethylene glycol powder (GLYCOLAX/MIRALAX) 17 GM/SCOOP powder, Take 17 g by mouth daily., Disp: 3350 g, Rfl: 1   prochlorperazine (COMPAZINE) 10 MG tablet, Take 10 mg by mouth every 6 (six) hours as needed for nausea or vomiting., Disp: , Rfl:    rOPINIRole (REQUIP) 4 MG tablet, Take 1 tablet (4 mg total) by mouth at bedtime., Disp: 90 tablet, Rfl: 3   rosuvastatin (CRESTOR) 10 MG tablet, Take 1 tablet (10 mg total) by mouth daily., Disp: 90 tablet, Rfl: 3   sertraline (ZOLOFT) 100 MG tablet, Take 1 tablet (100 mg total) by mouth at bedtime., Disp: 90 tablet, Rfl: 3   traZODone (DESYREL) 150 MG tablet, TAKE 1/3 (ONE-THIRD) to 1 (ONE) tablet BY MOUTH nightly as needed for sleep., Disp: 90 tablet, Rfl: 3   Ubrogepant (UBRELVY PO), Take by mouth., Disp: , Rfl:    zonisamide (ZONEGRAN) 100 MG capsule, Take 100 mg by mouth at bedtime., Disp: , Rfl:   Gen: Well-appearing female in no acute distress Neuro: No facial drooping appreciated.  Her speech is normal.  She is oriented  Assessment/ Plan: 62 y.o. female   Paresthesia Uncertain etiology but I do have concerns about demyelinating disease given migratory symptoms that did not improve after B12 injection.  I do think that she needs MRI of brain but it sounds like she is going to have to have some type of special process in efforts to get this.  I have asked that she contact her neurologist and neurosurgeon and inform them of these changes so that this can be further worked up.  Start time: 3:40pm End time: 3:53pm  Total time spent on patient care (including video visit/ documentation): 17 minutes  Amy Denardo Hulen Skains, DO [facility] [phone]

## 2022-06-09 DIAGNOSIS — R202 Paresthesia of skin: Secondary | ICD-10-CM | POA: Diagnosis not present

## 2022-06-09 DIAGNOSIS — Z982 Presence of cerebrospinal fluid drainage device: Secondary | ICD-10-CM | POA: Diagnosis not present

## 2022-06-09 DIAGNOSIS — R200 Anesthesia of skin: Secondary | ICD-10-CM | POA: Diagnosis not present

## 2022-06-09 DIAGNOSIS — G43719 Chronic migraine without aura, intractable, without status migrainosus: Secondary | ICD-10-CM | POA: Diagnosis not present

## 2022-06-11 DIAGNOSIS — R202 Paresthesia of skin: Secondary | ICD-10-CM | POA: Diagnosis not present

## 2022-06-11 DIAGNOSIS — R9082 White matter disease, unspecified: Secondary | ICD-10-CM | POA: Diagnosis not present

## 2022-06-11 DIAGNOSIS — R200 Anesthesia of skin: Secondary | ICD-10-CM | POA: Diagnosis not present

## 2022-06-11 DIAGNOSIS — Z982 Presence of cerebrospinal fluid drainage device: Secondary | ICD-10-CM | POA: Diagnosis not present

## 2022-06-13 DIAGNOSIS — Z9689 Presence of other specified functional implants: Secondary | ICD-10-CM | POA: Diagnosis not present

## 2022-06-13 DIAGNOSIS — G932 Benign intracranial hypertension: Secondary | ICD-10-CM | POA: Diagnosis not present

## 2022-06-23 DIAGNOSIS — R5383 Other fatigue: Secondary | ICD-10-CM | POA: Diagnosis not present

## 2022-06-23 DIAGNOSIS — M5416 Radiculopathy, lumbar region: Secondary | ICD-10-CM | POA: Diagnosis not present

## 2022-06-23 DIAGNOSIS — R202 Paresthesia of skin: Secondary | ICD-10-CM | POA: Diagnosis not present

## 2022-06-23 DIAGNOSIS — R200 Anesthesia of skin: Secondary | ICD-10-CM | POA: Diagnosis not present

## 2022-06-27 DIAGNOSIS — R200 Anesthesia of skin: Secondary | ICD-10-CM | POA: Diagnosis not present

## 2022-07-03 ENCOUNTER — Encounter: Payer: Self-pay | Admitting: Family Medicine

## 2022-07-03 ENCOUNTER — Ambulatory Visit (INDEPENDENT_AMBULATORY_CARE_PROVIDER_SITE_OTHER): Payer: Medicare Other | Admitting: Family Medicine

## 2022-07-03 VITALS — BP 125/79 | HR 85 | Temp 97.7°F | Ht 62.0 in | Wt 167.6 lb

## 2022-07-03 DIAGNOSIS — R202 Paresthesia of skin: Secondary | ICD-10-CM | POA: Diagnosis not present

## 2022-07-03 NOTE — Progress Notes (Signed)
Pins and needles sporadic. Sometimes on right side.  Subjective:  Patient ID: Amy Gray, female    DOB: 1960/08/31  Age: 62 y.o. MRN: [id number]  CC: PINS AND NEEDLES  (ALL OVER/) and Extremity Weakness (UPPER LEGS)   HPI Amy Gray presents for last night pins & needles in the throat. Thighs week several days. Had tremors twice in the back of her head. Stiff in the neck at night. Right leg tremoring too. Can occur anywhere. Worse later in the day.      07/03/2022    9:30 AM 05/13/2022    8:56 AM 04/02/2022    2:22 PM  Depression screen PHQ 2/9  Decreased Interest 0 0 0  Down, Depressed, Hopeless 0 0 0  PHQ - 2 Score 0 0 0    History M[place]el has a past medical history of Anxiety, Asthma, COPD (chronic obstructive pulmonary disease) (HCC), Depression, Emphysema of lung (HCC), GERD (gastroesophageal reflux disease), Hyperlipidemia, Migraines, Osteoarthritis, and Sleep apnea.   She has a past surgical history that includes Cervical fusion (01/20/2013); Rhinoplasty; Abdominal hysterectomy; Brain surgery (05/2016); Spine surgery (2015); and Tubal ligation (1986).   Her family history includes Arthritis in her father and mother; Asthma in her mother; Heart attack in her father; Heart disease in her father and mother; Hypertension in her sister.She reports that she has never smoked. She has never used smokeless tobacco. She reports that she does not drink alcohol and does not use drugs.    ROS Review of Systems  Constitutional: Negative.   HENT:  Negative for congestion.   Eyes:  Negative for visual disturbance.  Respiratory:  Negative for shortness of breath.   Cardiovascular:  Negative for chest pain.  Gastrointestinal:  Negative for abdominal pain, constipation, diarrhea, nausea and vomiting.  Genitourinary:  Negative for difficulty urinating.  Musculoskeletal:  Positive for arthralgias, myalgias and neck pain.  Neurological:  Positive for tremors, light-headedness and  numbness. Negative for headaches.  Psychiatric/Behavioral:  Positive for sleep disturbance.     Objective:  BP 125/79   Pulse 85   Temp 97.7 F (36.5 C)   Ht 5\' 2"  (1.575 m)   Wt 167 lb 9.6 oz (76 kg)   SpO2 96%   BMI 30.65 kg/m   BP Readings from Last 3 Encounters:  07/03/22 125/79  05/13/22 93/67  04/02/22 117/69    Wt Readings from Last 3 Encounters:  07/03/22 167 lb 9.6 oz (76 kg)  05/13/22 171 lb 12.8 oz (77.9 kg)  04/02/22 173 lb (78.5 kg)     Physical Exam Constitutional:      General: She is not in acute distress.    Appearance: She is well-developed.  HENT:     Head: Normocephalic and atraumatic.  Eyes:     Conjunctiva/sclera: Conjunctivae normal.     Pupils: Pupils are equal, round, and reactive to light.  Neck:     Thyroid: No thyromegaly.  Cardiovascular:     Rate and Rhythm: Normal rate and regular rhythm.     Heart sounds: Normal heart sounds. No murmur heard. Pulmonary:     Effort: Pulmonary effort is normal. No respiratory distress.     Breath sounds: Normal breath sounds. No wheezing or rales.  Abdominal:     General: Bowel sounds are normal. There is no distension.     Palpations: Abdomen is soft.     Tenderness: There is no abdominal tenderness.  Musculoskeletal:        General: Normal range of motion.  Cervical back: Normal range of motion and neck supple.  Lymphadenopathy:     Cervical: No cervical adenopathy.  Skin:    General: Skin is warm and dry.  Neurological:     Mental Status: She is alert and oriented to person, place, and time.  Psychiatric:        Behavior: Behavior normal.        Thought Content: Thought content normal.        Judgment: Judgment normal.       Assessment & Plan:   Amy Gray was seen today for pins and needles  and extremity weakness.  Diagnoses and all orders for this visit:  Paresthesias -     Ambulatory referral to Neurology       I am having Amy Gray maintain her Vitamin D,  acetaZOLAMIDE, Botox, Ubrogepant (UBRELVY PO), prochlorperazine, zonisamide, baclofen, traZODone, sertraline, budesonide, albuterol, polyethylene glycol powder, Breztri Aerosphere, omeprazole, rOPINIRole, rosuvastatin, cyanocobalamin, montelukast, and tezepelumab-ekko.  Allergies as of 07/03/2022       Reactions   Pregabalin Shortness Of Breath   Chlorzoxazone Other (See Comments)   Decrease in BP    Diclofenac Other (See Comments)   Other reaction(s): Other Other reaction(s): Other Update Update Update Update Other reaction(s): Other Other reaction(s): Other Update Update Update Update   Metoclopramide Other (See Comments)   Restless legs   Sibutramine    Other reaction(s): Other update   Topiramate Other (See Comments)   Venlafaxine Other (See Comments)   Restless legs   Amitriptyline    Duloxetine Hcl    Guaifenesin Er    Oxybutynin    Gabapentin    Other reaction(s): Confusion   Propranolol Anxiety        Medication List        Accurate as of July 03, 2022 11:59 PM. If you have any questions, ask your nurse or doctor.          acetaZOLAMIDE 250 MG tablet Commonly known as: DIAMOX Take 250 mg by mouth daily.   albuterol 108 (90 Base) MCG/ACT inhaler Commonly known as: VENTOLIN HFA Inhale 2 puffs into the lungs every 6 (six) hours as needed for wheezing or shortness of breath.   baclofen 10 MG tablet Commonly known as: LIORESAL Take 10 mg by mouth 3 (three) times daily.   Botox 200 units injection Generic drug: botulinum toxin Type A   Breztri Aerosphere 160-9-4.8 MCG/ACT Aero Generic drug: Budeson-Glycopyrrol-Formoterol Inhale 2 puffs into the lungs in the morning and at bedtime.   budesonide 0.25 MG/2ML nebulizer solution Commonly known as: PULMICORT Take 2 mLs (0.25 mg total) by nebulization 2 (two) times daily as needed (shortness of breath).   cyanocobalamin 1000 MCG/ML injection Commonly known as: VITAMIN B12 Inject 1 mL (1,000 mcg  total) into the skin every 30 (thirty) days.   montelukast 10 MG tablet Commonly known as: SINGULAIR Take 1 tablet (10 mg total) by mouth at bedtime.   omeprazole 40 MG capsule Commonly known as: PRILOSEC Take 1 capsule (40 mg total) by mouth 2 (two) times daily.   polyethylene glycol powder 17 GM/SCOOP powder Commonly known as: GLYCOLAX/MIRALAX Take 17 g by mouth daily.   prochlorperazine 10 MG tablet Commonly known as: COMPAZINE Take 10 mg by mouth every 6 (six) hours as needed for nausea or vomiting.   rOPINIRole 4 MG tablet Commonly known as: REQUIP Take 1 tablet (4 mg total) by mouth at bedtime.   rosuvastatin 10 MG tablet Commonly known as: CRESTOR Take 1  tablet (10 mg total) by mouth daily.   sertraline 100 MG tablet Commonly known as: ZOLOFT Take 1 tablet (100 mg total) by mouth at bedtime.   tezepelumab-ekko 210 MG/1. syringe Commonly known as: TEZSPIRE   traZODone 150 MG tablet Commonly known as: DESYREL TAKE 1/3 (ONE-THIRD) to 1 (ONE) tablet BY MOUTH nightly as needed for sleep.   UBRELVY PO Take by mouth.   Vitamin D 50 MCG (2000 UT) Caps Take 5,000 Units by mouth daily.   zonisamide 100 MG capsule Commonly known as: ZONEGRAN Take 100 mg by mouth at bedtime.         Follow-up: Return in about 3 months (around 10/03/2022).  Mechele Claude, M.D.

## 2022-07-04 ENCOUNTER — Encounter: Payer: Self-pay | Admitting: Family Medicine

## 2022-07-07 ENCOUNTER — Encounter: Payer: Self-pay | Admitting: Family Medicine

## 2022-08-12 ENCOUNTER — Ambulatory Visit: Payer: Medicare Other

## 2022-08-12 VITALS — Ht 62.0 in | Wt 167.0 lb

## 2022-08-12 DIAGNOSIS — Z0000 Encounter for general adult medical examination without abnormal findings: Secondary | ICD-10-CM

## 2022-08-12 DIAGNOSIS — G43719 Chronic migraine without aura, intractable, without status migrainosus: Secondary | ICD-10-CM | POA: Diagnosis not present

## 2022-08-12 NOTE — Progress Notes (Signed)
Subjective:   Amy Gray is a 62 y.o. female who presents for Medicare Annual (Subsequent) preventive examination.  Visit Complete: Virtual  I connected with  Amy Gray on 08/12/22 by a audio enabled telemedicine application and verified that I am speaking with the correct person using two identifiers.  Patient Location: Home  Provider Location: Home Office  I discussed the limitations of evaluation and management by telemedicine. The patient expressed understanding and agreed to proceed.  Patient Medicare AWV questionnaire was completed by the patient on 08/12/2022; I have confirmed that all information answered by patient is correct and no changes since this date.  Review of Systems    Per patient no change in vitals since last visit; unable to obtain new vitals due to this being a telehealth visit. Patient was unable to self-report vital signs via telehealth due to a lack of equipment at home.  Cardiac Risk Factors include: advanced age (>48men, >72 women);dyslipidemia;hypertension     Objective:    Today's Vitals   08/12/22 0919  Weight: 167 lb (75.8 kg)  Height: 5\' 2"  (1.575 m)   Body mass index is 30.54 kg/m.     08/12/2022    9:24 AM 07/31/2021    9:09 AM 07/25/2020    2:59 PM  Advanced Directives  Does Patient Have a Medical Advance Directive? No No No  Would patient like information on creating a medical advance directive? Yes (MAU/Ambulatory/Procedural Areas - Information given) No - Patient declined No - Patient declined    Current Medications (verified) Outpatient Encounter Medications as of 08/12/2022  Medication Sig   acetaZOLAMIDE (DIAMOX) 250 MG tablet Take 250 mg by mouth daily.   albuterol (VENTOLIN HFA) 108 (90 Base) MCG/ACT inhaler Inhale 2 puffs into the lungs every 6 (six) hours as needed for wheezing or shortness of breath.   baclofen (LIORESAL) 10 MG tablet Take 10 mg by mouth 3 (three) times daily.   BOTOX 200 units SOLR     Budeson-Glycopyrrol-Formoterol (BREZTRI AEROSPHERE) 160-9-4.8 MCG/ACT AERO Inhale 2 puffs into the lungs in the morning and at bedtime.   budesonide (PULMICORT) 0.25 MG/2ML nebulizer solution Take 2 mLs (0.25 mg total) by nebulization 2 (two) times daily as needed (shortness of breath).   Cholecalciferol (VITAMIN D) 2000 UNITS CAPS Take 5,000 Units by mouth daily.    cyanocobalamin (VITAMIN B12) 1000 MCG/ML injection Inject 1 mL (1,000 mcg total) into the skin every 30 (thirty) days.   montelukast (SINGULAIR) 10 MG tablet Take 1 tablet (10 mg total) by mouth at bedtime.   omeprazole (PRILOSEC) 40 MG capsule Take 1 capsule (40 mg total) by mouth 2 (two) times daily.   polyethylene glycol powder (GLYCOLAX/MIRALAX) 17 GM/SCOOP powder Take 17 g by mouth daily.   prochlorperazine (COMPAZINE) 10 MG tablet Take 10 mg by mouth every 6 (six) hours as needed for nausea or vomiting.   rOPINIRole (REQUIP) 4 MG tablet Take 1 tablet (4 mg total) by mouth at bedtime.   rosuvastatin (CRESTOR) 10 MG tablet Take 1 tablet (10 mg total) by mouth daily.   sertraline (ZOLOFT) 100 MG tablet Take 1 tablet (100 mg total) by mouth at bedtime.   tezepelumab-ekko (TEZSPIRE) 210 MG/1. syringe    traZODone (DESYREL) 150 MG tablet TAKE 1/3 (ONE-THIRD) to 1 (ONE) tablet BY MOUTH nightly as needed for sleep.   Ubrogepant (UBRELVY PO) Take by mouth.   zonisamide (ZONEGRAN) 100 MG capsule Take 100 mg by mouth at bedtime.   No facility-administered encounter medications on  file as of 08/12/2022.    Allergies (verified) Pregabalin, Chlorzoxazone, Diclofenac, Metoclopramide, Sibutramine, Topiramate, Venlafaxine, Amitriptyline, Duloxetine hcl, Guaifenesin er, Oxybutynin, Gabapentin, and Propranolol   History: Past Medical History:  Diagnosis Date   Anxiety    Asthma    COPD (chronic obstructive pulmonary disease) (HCC)    Depression    Emphysema of lung (HCC)    GERD (gastroesophageal reflux disease)    Hyperlipidemia     Migraines    Osteoarthritis    Sleep apnea    Very mild,use for moderate airway collapse   Past Surgical History:  Procedure Laterality Date   ABDOMINAL HYSTERECTOMY     BRAIN SURGERY  05/2016   CERVICAL FUSION  01/20/2013   C4 - C7   RHINOPLASTY     SPINE SURGERY  2015   TUBAL LIGATION  1986   Family History  Problem Relation Age of Onset   Arthritis Mother    Asthma Mother    Heart disease Mother    Arthritis Father    Heart disease Father    Heart attack Father    Hypertension Sister    Social History   Socioeconomic History   Marital status: Married    Spouse name: Not on file   Number of children: 2   Years of education: Not on file   Highest education level: 12th grade  Occupational History   Occupation: Disabled  Tobacco Use   Smoking status: Never   Smokeless tobacco: Never  Substance and Sexual Activity   Alcohol use: No   Drug use: No   Sexual activity: Not on file  Other Topics Concern   Not on file  Social History Narrative   Not on file   Social Determinants of Health   Financial Resource Strain: Low Risk  (08/12/2022)   Overall Financial Resource Strain (CARDIA)    Difficulty of Paying Living Expenses: Not hard at all  Food Insecurity: No Food Insecurity (08/12/2022)   Hunger Vital Sign    Worried About Running Out of Food in the Last Year: Never true    Ran Out of Food in the Last Year: Never true  Transportation Needs: No Transportation Needs (08/12/2022)   PRAPARE - Administrator, Civil Service (Medical): No    Lack of Transportation (Non-Medical): No  Physical Activity: Inactive (08/12/2022)   Exercise Vital Sign    Days of Exercise per Week: 0 days    Minutes of Exercise per Session: 0 min  Stress: No Stress Concern Present (08/12/2022)   [employer] of Occupational Health - Occupational Stress Questionnaire    Feeling of Stress : Not at all  Social Connections: Moderately Integrated (08/12/2022)   Social Connection  and Isolation Panel [NHANES]    Frequency of Communication with Friends and Family: More than three times a week    Frequency of Social Gatherings with Friends and Family: More than three times a week    Attends Religious Services: More than 4 times per year    Active Member of [employer] or Organizations: No    Attends Engineer, structural: Never    Marital Status: Married    Tobacco Counseling Counseling given: Not Answered   Clinical Intake:  Pre-visit preparation completed: Yes  Pain : No/denies pain     Nutritional Risks: None Diabetes: No  How often do you need to have someone help you when you read instructions, pamphlets, or other written materials from your doctor or pharmacy?: 1 - Never  Interpreter Needed?: No  Information entered by :: Renie Ora, LPN   Activities of Daily Living    08/12/2022    9:24 AM  In your present state of health, do you have any difficulty performing the following activities:  Hearing? 0  Vision? 0  Difficulty concentrating or making decisions? 0  Walking or climbing stairs? 0  Dressing or bathing? 0  Doing errands, shopping? 0  Preparing Food and eating ? N  Using the Toilet? N  In the past six months, have you accidently leaked urine? N  Do you have problems with loss of bowel control? N  Managing your Medications? N  Managing your Finances? N  Housekeeping or managing your Housekeeping? N    Patient Care Team: Mechele Claude, MD as PCP - General (Family Medicine) Gunnar Fusi, MD (Internal Medicine)  Indicate any recent Medical Services you may have received from other than [facility] providers in the past year (date may be approximate).     Assessment:   This is a routine wellness examination for [place].  Hearing/Vision screen Vision Screening - Comments:: Wears rx glasses - up to date with routine eye exams with  Dr.Lee   Dietary issues and exercise activities discussed:     Goals Addressed              This Visit's Progress    DIET - EAT MORE FRUITS AND VEGETABLES   On track      Depression Screen    08/12/2022    9:23 AM 07/03/2022    9:30 AM 05/13/2022    8:56 AM 04/02/2022    2:22 PM 02/10/2022    8:22 AM 01/14/2022    1:49 PM 11/07/2021    1:16 PM  PHQ 2/9 Scores  PHQ - 2 Score 0 0 0 0 0 0 0  PHQ- 9 Score     0  0    Fall Risk    08/12/2022    9:20 AM 07/03/2022    9:30 AM 05/13/2022    8:56 AM 04/02/2022    2:22 PM 02/10/2022    8:22 AM  Fall Risk   Falls in the past year? 0 0 0 0 0  Number falls in past yr: 0      Injury with Fall? 0      Risk for fall due to : No Fall Risks      Follow up Falls prevention discussed    Falls evaluation completed    MEDICARE RISK AT HOME:  Medicare Risk at Home - 08/12/22 0920     Any stairs in or around the home? Yes    If so, are there any without handrails? No    Home free of loose throw rugs in walkways, pet beds, electrical cords, etc? Yes    Adequate lighting in your home to reduce risk of falls? Yes    Life alert? No    Use of a cane, walker or w/c? No    Grab bars in the bathroom? No    Shower chair or bench in shower? No    Elevated toilet seat or a handicapped toilet? No             TIMED UP AND GO:  Was the test performed?  No    Cognitive Function:        08/12/2022    9:24 AM 07/31/2021    9:12 AM 07/25/2020    3:00 PM  6CIT Screen  What Year? 0  points 0 points 0 points  What month? 0 points 0 points 0 points  What time? 0 points 0 points 0 points  Count back from 20 0 points 0 points 0 points  Months in reverse 0 points 0 points 0 points  Repeat phrase 0 points 0 points 0 points  Total Score 0 points 0 points 0 points    Immunizations Immunization History  Administered Date(s) Administered   Influenza Split 12/21/2014, 12/24/2015, 10/23/2016   Influenza, Seasonal, Injecte, Preservative Fre 12/21/2014   Influenza,inj,Quad PF,6+ Mos 12/21/2014, 12/24/2015, 10/23/2016, 01/15/2018, 12/07/2018,  12/05/2020, 12/05/2020   Influenza,inj,quad, With Preservative 12/05/2020   Influenza-Unspecified 10/20/2012, 10/20/2013, 12/21/2014   PNEUMOCOCCAL CONJUGATE-20 12/05/2020   Pneumococcal Conjugate-13 12/21/2014   Td 01/21/2006   Td (Adult),5 Lf Tetanus Toxid, Preservative Free 01/21/2006    TDAP status: Due, Education has been provided regarding the importance of this vaccine. Advised may receive this vaccine at local pharmacy or Health Dept. Aware to provide a copy of the vaccination record if obtained from local pharmacy or Health Dept. Verbalized acceptance and understanding.  Flu Vaccine status: Declined, Education has been provided regarding the importance of this vaccine but patient still declined. Advised may receive this vaccine at local pharmacy or Health Dept. Aware to provide a copy of the vaccination record if obtained from local pharmacy or Health Dept. Verbalized acceptance and understanding.  Pneumococcal vaccine status: Up to date  Covid-19 vaccine status: Declined, Education has been provided regarding the importance of this vaccine but patient still declined. Advised may receive this vaccine at local pharmacy or Health Dept.or vaccine clinic. Aware to provide a copy of the vaccination record if obtained from local pharmacy or Health Dept. Verbalized acceptance and understanding.  Qualifies for Shingles Vaccine? Yes   Zostavax completed No   Shingrix Completed?: No.    Education has been provided regarding the importance of this vaccine. Patient has been advised to call insurance company to determine out of pocket expense if they have not yet received this vaccine. Advised may also receive vaccine at local pharmacy or Health Dept. Verbalized acceptance and understanding.  Screening Tests Health Maintenance  Topic Date Due   DTaP/Tdap/Td (3 - Tdap) 01/22/2016   COVID-19 Vaccine (1 - 2023-24 season) Never done   Zoster Vaccines- Shingrix (1 of 2) 08/12/2022 (Originally  07/09/2010)   Hepatitis C Screening  11/08/2022 (Originally [birth year]09/1978)   HIV Screening  11/08/2022 (Originally 07/09/1975)   INFLUENZA VACCINE  08/21/2022   Colonoscopy  05/19/2023   MAMMOGRAM  06/02/2023   Medicare Annual Wellness (AWV)  08/12/2023   HPV VACCINES  Aged Out   PAP SMEAR-Modifier  Discontinued    Health Maintenance  Health Maintenance Due  Topic Date Due   DTaP/Tdap/Td (3 - Tdap) 01/22/2016   COVID-19 Vaccine (1 - 2023-24 season) Never done    Colorectal cancer screening: Type of screening: Colonoscopy. Completed 05/18/2013. Repeat every 10 years  Mammogram status: Completed 06/02/2022. Repeat every year  Bone Density status: Ordered not of age . Pt provided with contact info and advised to call to schedule appt.  Lung Cancer Screening: (Low Dose CT Chest recommended if Age 28-80 years, 20 pack-year currently smoking OR have quit w/in 15years.) does not qualify.   Lung Cancer Screening Referral: n/a  Additional Screening:  Hepatitis C Screening: does not qualify; Completed declined   Vision Screening: Recommended annual ophthalmology exams for early detection of glaucoma and other disorders of the eye. Is the patient up to date with their annual  eye exam?  Yes  Who is the provider or what is the name of the office in which the patient attends annual eye exams? Dr.Lee  If pt is not established with a provider, would they like to be referred to a provider to establish care? No .   Dental Screening: Recommended annual dental exams for proper oral hygiene    Community Resource Referral / Chronic Care Management: CRR required this visit?  No   CCM required this visit?  No     Plan:     I have personally reviewed and noted the following in the patient's chart:   Medical and social history Use of alcohol, tobacco or illicit drugs  Current medications and supplements including opioid prescriptions. Patient is not currently taking opioid  prescriptions. Functional ability and status Nutritional status Physical activity Advanced directives List of other physicians Hospitalizations, surgeries, and ER visits in previous 12 months Vitals Screenings to include cognitive, depression, and falls Referrals and appointments  In addition, I have reviewed and discussed with patient certain preventive protocols, quality metrics, and best practice recommendations. A written personalized care plan for preventive services as well as general preventive health recommendations were provided to patient.     Lorrene Reid, LPN   [id number]   After Visit Summary: (MyChart) Due to this being a telephonic visit, the after visit summary with patients personalized plan was offered to patient via MyChart   Nurse Notes: Die TDAP Vaccine

## 2022-08-12 NOTE — Patient Instructions (Signed)
Amy Gray , Thank you for taking time to come for your Medicare Wellness Visit. I appreciate your ongoing commitment to your health goals. Please review the following plan we discussed and let me know if I can assist you in the future.   These are the goals we discussed:  Goals      DIET - EAT MORE FRUITS AND VEGETABLES     Exercise 150 min/wk Moderate Activity     07/31/2021-Pt states she has not been able to exercise due to back issues.        This is a list of the screening recommended for you and due dates:  Health Maintenance  Topic Date Due   DTaP/Tdap/Td vaccine (3 - Tdap) 01/22/2016   COVID-19 Vaccine (1 - 2023-24 season) Never done   Zoster (Shingles) Vaccine (1 of 2) 08/12/2022*   Hepatitis C Screening  11/08/2022*   HIV Screening  11/08/2022*   Flu Shot  08/21/2022   Colon Cancer Screening  05/19/2023   Mammogram  06/02/2023   Medicare Annual Wellness Visit  08/12/2023   HPV Vaccine  Aged Out   Pap Smear  Discontinued  *Topic was postponed. The date shown is not the original due date.    Advanced directives: Advance directive discussed with you today. I have provided a copy for you to complete at home and have notarized. Once this is complete please bring a copy in to our office so we can scan it into your chart. Information on Advanced Care Planning can be found at [facility] [place] Advance Health Care Directives Advance Health Care Directives ([email or web address]    Conditions/risks identified: Aim for 30 minutes of exercise or brisk walking, 6-8 glasses of water, and 5 servings of fruits and vegetables each day.   Next appointment: Follow up in one year for your annual wellness visit.   Preventive Care 40-64 Years, Female Preventive care refers to lifestyle choices and visits with your health care provider that can promote health and wellness. What does preventive care include? A yearly physical exam. This is also called an annual well check. Dental  exams once or twice a year. Routine eye exams. Ask your health care provider how often you should have your eyes checked. Personal lifestyle choices, including: Daily care of your teeth and gums. Regular physical activity. Eating a healthy diet. Avoiding tobacco and drug use. Limiting alcohol use. Practicing safe sex. Taking low-dose aspirin daily starting at age 34. Taking vitamin and mineral supplements as recommended by your health care provider. What happens during an annual well check? The services and screenings done by your health care provider during your annual well check will depend on your age, overall health, lifestyle risk factors, and family history of disease. Counseling  Your health care provider may ask you questions about your: Alcohol use. Tobacco use. Drug use. Emotional well-being. Home and relationship well-being. Sexual activity. Eating habits. Work and work Astron[employer]. Method of birth control. Menstrual cycle. Pregnancy history. Screening  You may have the following tests or measurements: Height, weight, and BMI. Blood pressure. Lipid and cholesterol levels. These may be checked every 5 years, or more frequently if you are over 69 years old. Skin check. Lung cancer screening. You may have this screening every year starting at age 98 if you have a 30-pack-year history of smoking and currently smoke or have quit within the past 15 years. Fecal occult blood test (FOBT) of the stool. You may have this test every year starting  at age 27. Flexible sigmoidoscopy or colonoscopy. You may have a sigmoidoscopy every 5 years or a colonoscopy every 10 years starting at age 37. Hepatitis C blood test. Hepatitis B blood test. Sexually transmitted disease (STD) testing. Diabetes screening. This is done by checking your blood sugar (glucose) after you have not eaten for a while (fasting). You may have this done every 1-3 years. Mammogram. This may be done every 1-2  years. Talk to your health care provider about when you should start having regular mammograms. This may depend on whether you have a family history of breast cancer. BRCA-related cancer screening. This may be done if you have a family history of breast, ovarian, tubal, or peritoneal cancers. Pelvic exam and Pap test. This may be done every 3 years starting at age 1. Starting at age 60, this may be done every 5 years if you have a Pap test in combination with an HPV test. Bone density scan. This is done to screen for osteoporosis. You may have this scan if you are at high risk for osteoporosis. Discuss your test results, treatment options, and if necessary, the need for more tests with your health care provider. Vaccines  Your health care provider may recommend certain vaccines, such as: Influenza vaccine. This is recommended every year. Tetanus, diphtheria, and acellular pertussis (Tdap, Td) vaccine. You may need a Td booster every 10 years. Zoster vaccine. You may need this after age 49. Pneumococcal 13-valent conjugate (PCV13) vaccine. You may need this if you have certain conditions and were not previously vaccinated. Pneumococcal polysaccharide (PPSV23) vaccine. You may need one or two doses if you smoke cigarettes or if you have certain conditions. Talk to your health care provider about which screenings and vaccines you need and how often you need them. This information is not intended to replace advice given to you by your health care provider. Make sure you discuss any questions you have with your health care provider. Document Released: 02/02/2015 Document Revised: 09/26/2015 Document Reviewed: 11/07/2014 Elsevier Interactive Patient Education  2017 [employer].    Fall Prevention in the Home Falls can cause injuries. They can happen to people of all ages. There are many things you can do to make your home safe and to help prevent falls. What can I do on the outside of my  home? Regularly fix the edges of walkways and driveways and fix any cracks. Remove anything that might make you trip as you walk through a door, such as a raised step or threshold. Trim any bushes or trees on the path to your home. Use bright outdoor lighting. Clear any walking paths of anything that might make someone trip, such as rocks or tools. Regularly check to see if handrails are loose or broken. Make sure that both sides of any steps have handrails. Any raised decks and porches should have guardrails on the edges. Have any leaves, snow, or ice cleared regularly. Use sand or salt on walking paths during winter. Clean up any spills in your garage right away. This includes oil or grease spills. What can I do in the bathroom? Use night lights. Install grab bars by the toilet and in the tub and shower. Do not use towel bars as grab bars. Use non-skid mats or decals in the tub or shower. If you need to sit down in the shower, use a plastic, non-slip stool. Keep the floor dry. Clean up any water that spills on the floor as soon as it happens. Remove  soap buildup in the tub or shower regularly. Attach bath mats securely with double-sided non-slip rug tape. Do not have throw rugs and other things on the floor that can make you trip. What can I do in the bedroom? Use night lights. Make sure that you have a light by your bed that is easy to reach. Do not use any sheets or blankets that are too big for your bed. They should not hang down onto the floor. Have a firm chair that has side arms. You can use this for support while you get dressed. Do not have throw rugs and other things on the floor that can make you trip. What can I do in the kitchen? Clean up any spills right away. Avoid walking on wet floors. Keep items that you use a lot in easy-to-reach places. If you need to reach something above you, use a strong step stool that has a grab bar. Keep electrical cords out of the way. Do  not use floor polish or wax that makes floors slippery. If you must use wax, use non-skid floor wax. Do not have throw rugs and other things on the floor that can make you trip. What can I do with my stairs? Do not leave any items on the stairs. Make sure that there are handrails on both sides of the stairs and use them. Fix handrails that are broken or loose. Make sure that handrails are as long as the stairways. Check any carpeting to make sure that it is firmly attached to the stairs. Fix any carpet that is loose or worn. Avoid having throw rugs at the top or bottom of the stairs. If you do have throw rugs, attach them to the floor with carpet tape. Make sure that you have a light switch at the top of the stairs and the bottom of the stairs. If you do not have them, ask someone to add them for you. What else can I do to help prevent falls? Wear shoes that: Do not have high heels. Have rubber bottoms. Are comfortable and fit you well. Are closed at the toe. Do not wear sandals. If you use a stepladder: Make sure that it is fully opened. Do not climb a closed stepladder. Make sure that both sides of the stepladder are locked into place. Ask someone to hold it for you, if possible. Clearly mark and make sure that you can see: Any grab bars or handrails. First and last steps. Where the edge of each step is. Use tools that help you move around (mobility aids) if they are needed. These include: Canes. Walkers. Scooters. Crutches. Turn on the lights when you go into a dark area. Replace any light bulbs as soon as they burn out. Set up your furniture so you have a clear path. Avoid moving your furniture around. If any of your floors are uneven, fix them. If there are any pets around you, be aware of where they are. Review your medicines with your doctor. Some medicines can make you feel dizzy. This can increase your chance of falling. Ask your doctor what other things that you can do to  help prevent falls. This information is not intended to replace advice given to you by your health care provider. Make sure you discuss any questions you have with your health care provider. Document Released: 11/02/2008 Document Revised: 06/14/2015 Document Reviewed: 02/10/2014 Elsevier Interactive Patient Education  2017 [employer].

## 2022-08-25 DIAGNOSIS — M5416 Radiculopathy, lumbar region: Secondary | ICD-10-CM | POA: Diagnosis not present

## 2022-08-25 DIAGNOSIS — R200 Anesthesia of skin: Secondary | ICD-10-CM | POA: Diagnosis not present

## 2022-08-25 DIAGNOSIS — R202 Paresthesia of skin: Secondary | ICD-10-CM | POA: Diagnosis not present

## 2022-08-26 ENCOUNTER — Other Ambulatory Visit: Payer: Self-pay | Admitting: Family Medicine

## 2022-08-26 DIAGNOSIS — F411 Generalized anxiety disorder: Secondary | ICD-10-CM

## 2022-09-09 DIAGNOSIS — M5136 Other intervertebral disc degeneration, lumbar region: Secondary | ICD-10-CM | POA: Diagnosis not present

## 2022-09-09 DIAGNOSIS — M5031 Other cervical disc degeneration,  high cervical region: Secondary | ICD-10-CM | POA: Diagnosis not present

## 2022-09-09 DIAGNOSIS — Z9989 Dependence on other enabling machines and devices: Secondary | ICD-10-CM | POA: Diagnosis not present

## 2022-09-09 DIAGNOSIS — M47812 Spondylosis without myelopathy or radiculopathy, cervical region: Secondary | ICD-10-CM | POA: Diagnosis not present

## 2022-09-09 DIAGNOSIS — M4182 Other forms of scoliosis, cervical region: Secondary | ICD-10-CM | POA: Diagnosis not present

## 2022-09-09 DIAGNOSIS — G4733 Obstructive sleep apnea (adult) (pediatric): Secondary | ICD-10-CM | POA: Diagnosis not present

## 2022-09-09 DIAGNOSIS — Z981 Arthrodesis status: Secondary | ICD-10-CM | POA: Diagnosis not present

## 2022-09-09 DIAGNOSIS — R079 Chest pain, unspecified: Secondary | ICD-10-CM | POA: Diagnosis not present

## 2022-09-09 DIAGNOSIS — M5412 Radiculopathy, cervical region: Secondary | ICD-10-CM | POA: Diagnosis not present

## 2022-09-09 DIAGNOSIS — M542 Cervicalgia: Secondary | ICD-10-CM | POA: Diagnosis not present

## 2022-09-09 DIAGNOSIS — M5416 Radiculopathy, lumbar region: Secondary | ICD-10-CM | POA: Diagnosis not present

## 2022-09-09 DIAGNOSIS — M47819 Spondylosis without myelopathy or radiculopathy, site unspecified: Secondary | ICD-10-CM | POA: Diagnosis not present

## 2022-09-09 DIAGNOSIS — J4551 Severe persistent asthma with (acute) exacerbation: Secondary | ICD-10-CM | POA: Diagnosis not present

## 2022-09-09 DIAGNOSIS — M5033 Other cervical disc degeneration, cervicothoracic region: Secondary | ICD-10-CM | POA: Diagnosis not present

## 2022-09-20 IMAGING — MG MM DIGITAL SCREENING BILAT W/ TOMO AND CAD
8 series · 8 of 24 positions shown · non-contrast
Comparison: Previous exam(s).

CLINICAL DATA: Screening.

EXAM:
DIGITAL SCREENING BILATERAL MAMMOGRAM WITH TOMOSYNTHESIS AND CAD
TECHNIQUE: Bilateral screening digital craniocaudal and mediolateral oblique
mammograms were obtained. Bilateral screening digital breast
tomosynthesis was performed. The images were evaluated with
computer-aided detection.

[L MLO synth-2D]
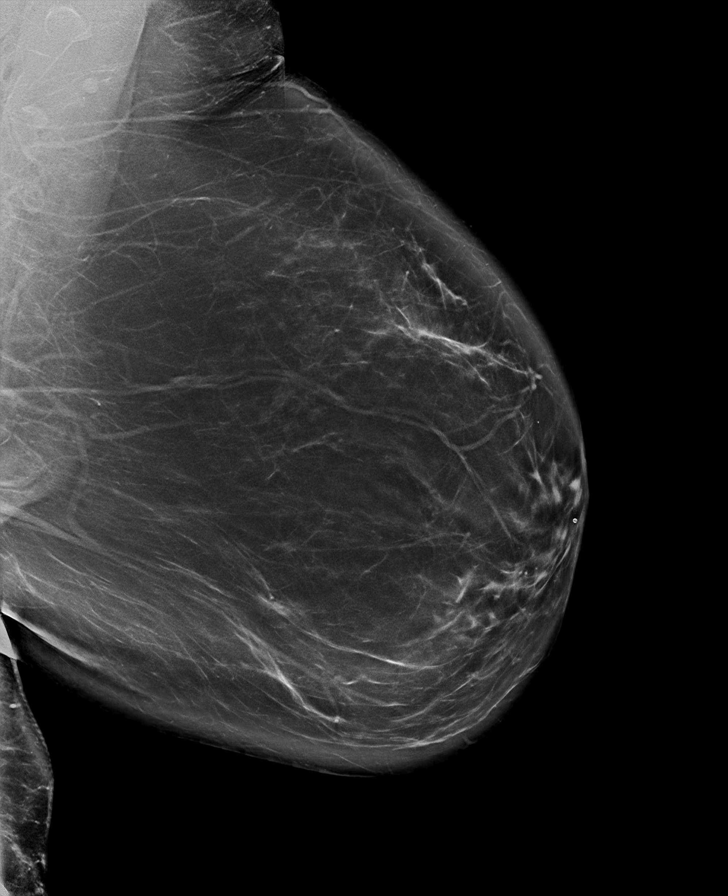

[L CC synth-2D]
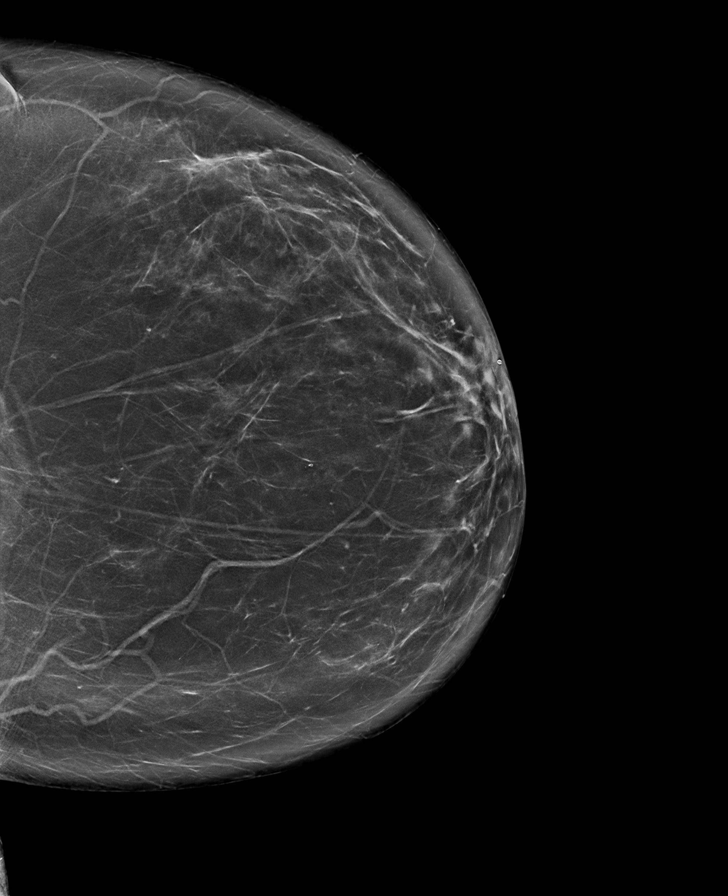

[R CC synth-2D]
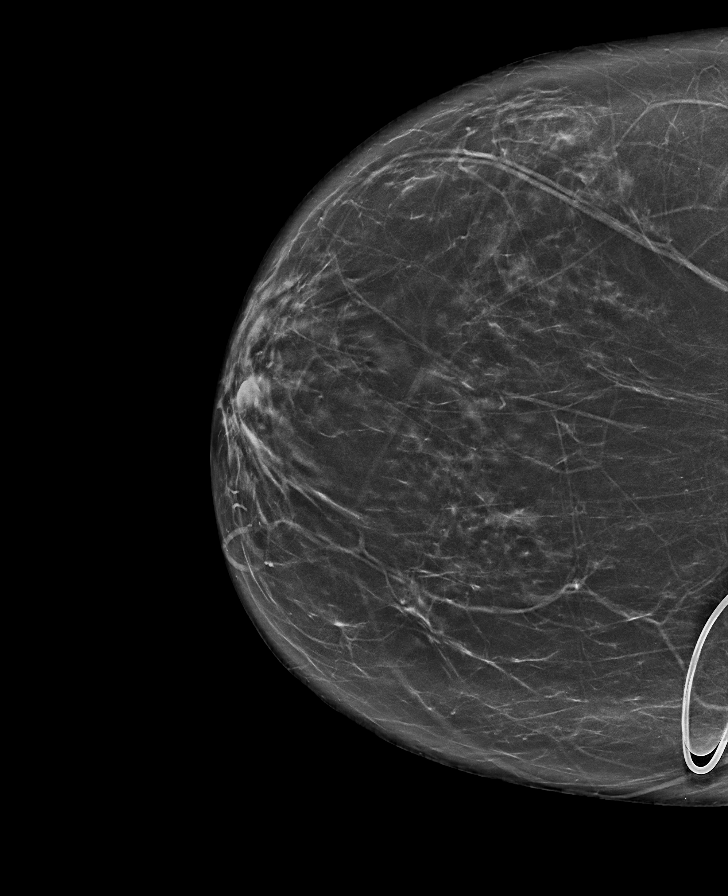

[R MLO synth-2D]
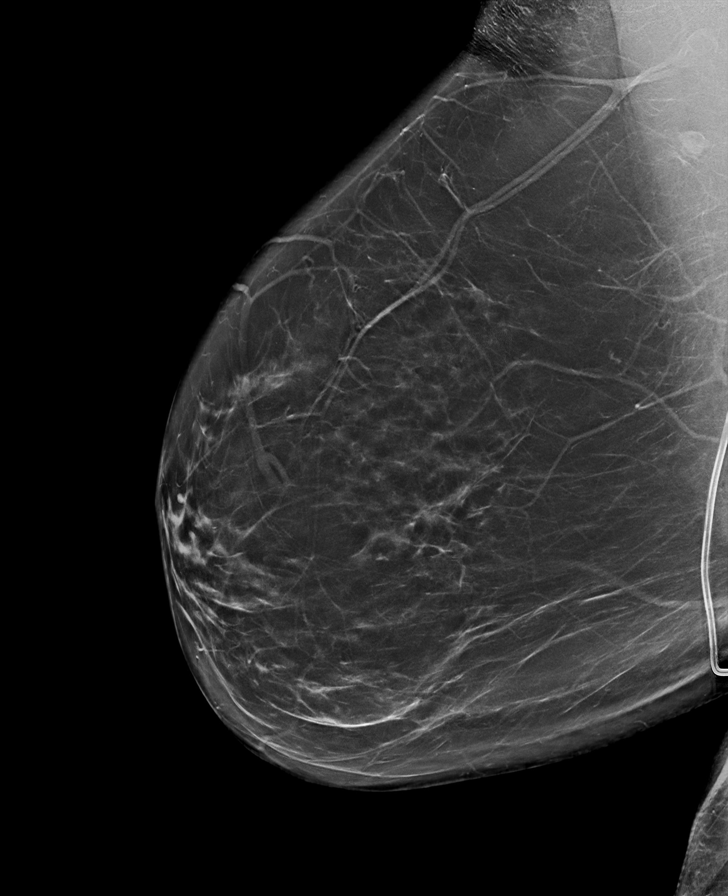

[R MLO tomo · tomo slice 44/87.0]
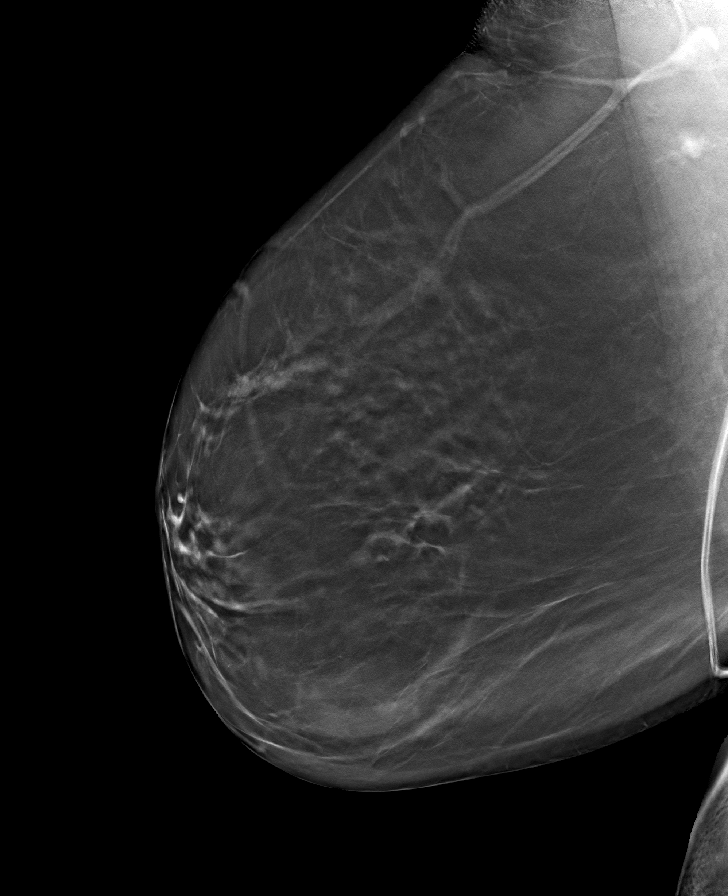

[L CC tomo · tomo slice 43/85.0]
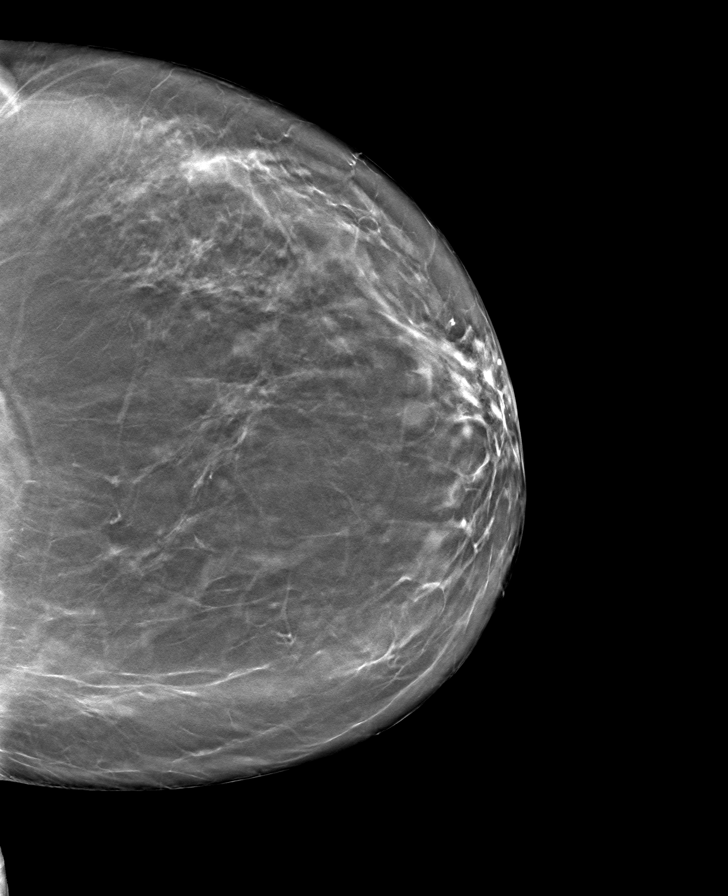

[R CC tomo · tomo slice 38/75.0]
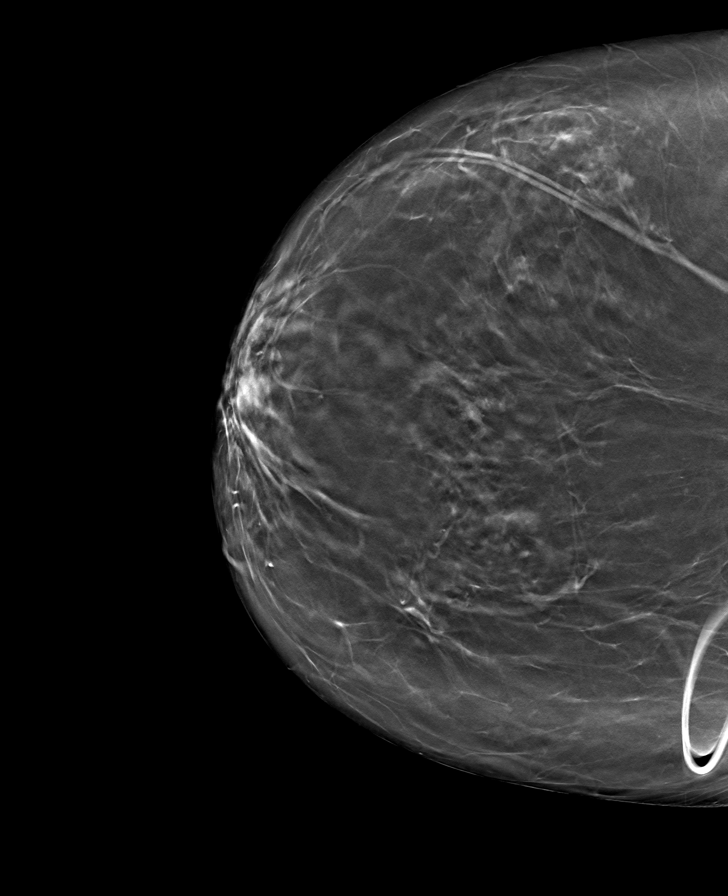

[L MLO tomo · tomo slice 49/98.0]
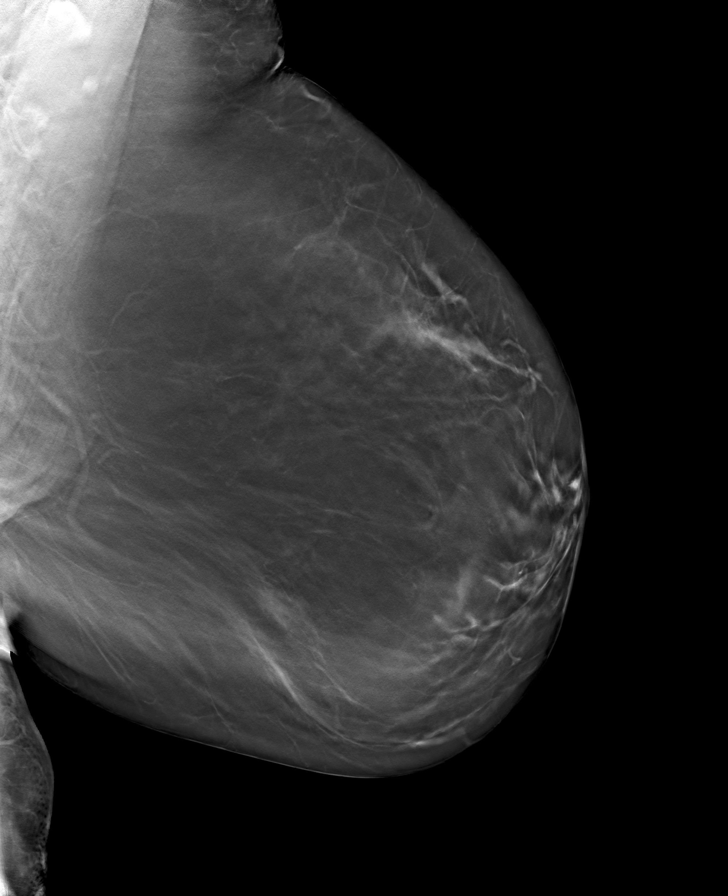

[8 of 24 positions shown; findings below may reference images not displayed]

ACR Breast Density Category b: There are scattered areas of
fibroglandular density.
FINDINGS: There are no findings suspicious for malignancy.
IMPRESSION: No mammographic evidence of malignancy. A result letter of this
screening mammogram will be mailed directly to the patient.

RECOMMENDATION:
Screening mammogram in one year. (Code:51-O-LD2)

BI-RADS CATEGORY  1: Negative.

## 2022-09-25 DIAGNOSIS — Z9689 Presence of other specified functional implants: Secondary | ICD-10-CM | POA: Diagnosis not present

## 2022-09-25 DIAGNOSIS — M4722 Other spondylosis with radiculopathy, cervical region: Secondary | ICD-10-CM | POA: Diagnosis not present

## 2022-09-25 DIAGNOSIS — M5011 Cervical disc disorder with radiculopathy,  high cervical region: Secondary | ICD-10-CM | POA: Diagnosis not present

## 2022-09-25 DIAGNOSIS — M4802 Spinal stenosis, cervical region: Secondary | ICD-10-CM | POA: Diagnosis not present

## 2022-09-25 DIAGNOSIS — Z981 Arthrodesis status: Secondary | ICD-10-CM | POA: Diagnosis not present

## 2022-09-25 DIAGNOSIS — G932 Benign intracranial hypertension: Secondary | ICD-10-CM | POA: Diagnosis not present

## 2022-09-25 DIAGNOSIS — M2578 Osteophyte, vertebrae: Secondary | ICD-10-CM | POA: Diagnosis not present

## 2022-10-03 ENCOUNTER — Encounter: Payer: Self-pay | Admitting: Family Medicine

## 2022-10-03 ENCOUNTER — Ambulatory Visit (INDEPENDENT_AMBULATORY_CARE_PROVIDER_SITE_OTHER): Payer: Medicare Other | Admitting: Family Medicine

## 2022-10-03 VITALS — BP 97/64 | HR 74 | Temp 98.0°F | Ht 62.0 in | Wt 167.0 lb

## 2022-10-03 DIAGNOSIS — K5901 Slow transit constipation: Secondary | ICD-10-CM

## 2022-10-03 DIAGNOSIS — R1013 Epigastric pain: Secondary | ICD-10-CM | POA: Diagnosis not present

## 2022-10-03 DIAGNOSIS — R110 Nausea: Secondary | ICD-10-CM

## 2022-10-03 MED ORDER — POLYETHYLENE GLYCOL 3350 17 GM/SCOOP PO POWD
17.0000 g | Freq: Every day | ORAL | 1 refills | Status: AC
Start: 2022-10-03 — End: ?

## 2022-10-03 MED ORDER — PROCHLORPERAZINE MALEATE 10 MG PO TABS: 5.0000 mg | ORAL_TABLET | Freq: Three times a day (TID) | ORAL | 0 refills | Status: DC | PRN

## 2022-10-03 MED ORDER — FAMOTIDINE 40 MG PO TABS
40.0000 mg | ORAL_TABLET | Freq: Every day | ORAL | 0 refills | Status: DC
Start: 2022-10-03 — End: 2023-08-27

## 2022-10-03 NOTE — Progress Notes (Signed)
Subjective:  Patient ID: Amy Gray, female    DOB: 25-Aug-1960, 62 y.o.   MRN: [id number]  Patient Care Team: Mechele Claude, MD as PCP - General (Family Medicine) Gunnar Fusi, MD (Internal Medicine)   Chief Complaint:  GI Problem (X 1 week/Burning, bloating/Worse after meals)  HPI: Amy Gray is a 62 y.o. female presenting on 10/03/2022 for GI Problem (X 1 week/Burning, bloating/Worse after meals)  GI Problem   States that abdominal pain started one week ago. Reports that she has always had bloating. She is trying to eat lighter. Decribes it as burning. States that she is going to the bathroom, reports that it is loose but not diarrhea. States that she is normally constipated. Tried pepto bismol and tums, neither helped. Is eating bananas, bread, crackers. She is still taking omeprazole, is not taking miralax or compazine. Denies urinary symptoms. Endorses headache on and off. She is established with GI in [place], [facility]. States that she used to take compazine for migraines. Denies blood and mucus in her stools.  Endorses right lower quadrant pain. 3/10. Endorses nausea. Denies vomiting. States that she did not have a fever at home, but had chills. States that she was in bed with a jacket and gloves on.   Relevant past medical, surgical, family, and social history reviewed and updated as indicated.  Allergies and medications reviewed and updated. Data reviewed: Chart in Epic.   Past Medical History:  Diagnosis Date   Anxiety    Asthma    COPD (chronic obstructive pulmonary disease) (HCC)    Depression    Emphysema of lung (HCC)    GERD (gastroesophageal reflux disease)    Hyperlipidemia    Migraines    Osteoarthritis    Sleep apnea    Very mild,use for moderate airway collapse    Past Surgical History:  Procedure Laterality Date   ABDOMINAL HYSTERECTOMY     BRAIN SURGERY  05/2016   CERVICAL FUSION  01/20/2013   C4 - C7   RHINOPLASTY     SPINE SURGERY   2015   TUBAL LIGATION  1986    Social History   Socioeconomic History   Marital status: Married    Spouse name: Not on file   Number of children: 2   Years of education: Not on file   Highest education level: 12th grade  Occupational History   Occupation: Disabled  Tobacco Use   Smoking status: Never   Smokeless tobacco: Never  Substance and Sexual Activity   Alcohol use: No   Drug use: No   Sexual activity: Not on file  Other Topics Concern   Not on file  Social History Narrative   Not on file   Social Determinants of Health   Financial Resource Strain: Low Risk  (08/12/2022)   Overall Financial Resource Strain (CARDIA)    Difficulty of Paying Living Expenses: Not hard at all  Food Insecurity: No Food Insecurity (08/12/2022)   Hunger Vital Sign    Worried About Running Out of Food in the Last Year: Never true    Ran Out of Food in the Last Year: Never true  Transportation Needs: No Transportation Needs (08/12/2022)   PRAPARE - Administrator, Civil Service (Medical): No    Lack of Transportation (Non-Medical): No  Physical Activity: Inactive (08/12/2022)   Exercise Vital Sign    Days of Exercise per Week: 0 days    Minutes of Exercise per Session: 0 min  Stress:  No Stress Concern Present (08/12/2022)   [employer] of Occupational Health - Occupational Stress Questionnaire    Feeling of Stress : Not at all  Social Connections: Moderately Integrated (08/12/2022)   Social Connection and Isolation Panel [NHANES]    Frequency of Communication with Friends and Family: More than three times a week    Frequency of Social Gatherings with Friends and Family: More than three times a week    Attends Religious Services: More than 4 times per year    Active Member of [employer] or Organizations: No    Attends Banker Meetings: Never    Marital Status: Married  Catering manager Violence: Not At Risk (08/12/2022)   Humiliation, Afraid, Rape, and Kick  questionnaire    Fear of Current or Ex-Partner: No    Emotionally Abused: No    Physically Abused: No    Sexually Abused: No    Outpatient Encounter Medications as of 10/03/2022  Medication Sig   acetaZOLAMIDE (DIAMOX) 250 MG tablet Take 250 mg by mouth daily.   albuterol (VENTOLIN HFA) 108 (90 Base) MCG/ACT inhaler Inhale 2 puffs into the lungs every 6 (six) hours as needed for wheezing or shortness of breath.   baclofen (LIORESAL) 10 MG tablet Take 10 mg by mouth 3 (three) times daily.   BOTOX 200 units SOLR    Budeson-Glycopyrrol-Formoterol (BREZTRI AEROSPHERE) 160-9-4.8 MCG/ACT AERO Inhale 2 puffs into the lungs in the morning and at bedtime.   budesonide (PULMICORT) 0.25 MG/2ML nebulizer solution Take 2 mLs (0.25 mg total) by nebulization 2 (two) times daily as needed (shortness of breath).   Cholecalciferol (VITAMIN D) 2000 UNITS CAPS Take 5,000 Units by mouth daily.    cyanocobalamin (VITAMIN B12) 1000 MCG/ML injection Inject 1 mL (1,000 mcg total) into the skin every 30 (thirty) days.   montelukast (SINGULAIR) 10 MG tablet Take 1 tablet (10 mg total) by mouth at bedtime.   omeprazole (PRILOSEC) 40 MG capsule Take 1 capsule (40 mg total) by mouth 2 (two) times daily.   polyethylene glycol powder (GLYCOLAX/MIRALAX) 17 GM/SCOOP powder Take 17 g by mouth daily.   prochlorperazine (COMPAZINE) 10 MG tablet Take 10 mg by mouth every 6 (six) hours as needed for nausea or vomiting.   rOPINIRole (REQUIP) 4 MG tablet Take 1 tablet (4 mg total) by mouth at bedtime.   rosuvastatin (CRESTOR) 10 MG tablet Take 1 tablet (10 mg total) by mouth daily.   sertraline (ZOLOFT) 100 MG tablet Take 1 tablet (100 mg total) by mouth at bedtime.   tezepelumab-ekko (TEZSPIRE) 210 MG/1. syringe    traZODone (DESYREL) 150 MG tablet TAKE 1/3 (ONE-THIRD) to 1 (ONE) tablet BY MOUTH nightly as needed for sleep.   Ubrogepant (UBRELVY PO) Take by mouth.   zonisamide (ZONEGRAN) 100 MG capsule Take 100 mg by mouth at  bedtime.   No facility-administered encounter medications on file as of 10/03/2022.    Allergies  Allergen Reactions   Pregabalin Shortness Of Breath   Chlorzoxazone Other (See Comments)    Decrease in BP    Diclofenac Other (See Comments)    Other reaction(s): Other Other reaction(s): Other Update Update Update Update Other reaction(s): Other Other reaction(s): Other Update Update Update Update    Metoclopramide Other (See Comments)    Restless legs   Sibutramine     Other reaction(s): Other update   Topiramate Other (See Comments)   Venlafaxine Other (See Comments)    Restless legs   Amitriptyline    Duloxetine Hcl  Guaifenesin Er    Oxybutynin    Gabapentin     Other reaction(s): Confusion   Propranolol Anxiety    Review of Systems As per HPI  Objective:  BP 97/64   Pulse 74   Temp 98 F (36.7 C)   Ht 5\' 2"  (1.575 m)   Wt 167 lb (75.8 kg)   SpO2 96%   BMI 30.54 kg/m    Wt Readings from Last 3 Encounters:  10/03/22 167 lb (75.8 kg)  08/12/22 167 lb (75.8 kg)  07/03/22 167 lb 9.6 oz (76 kg)   Physical Exam Constitutional:      General: She is awake. She is not in acute distress.    Appearance: Normal appearance. She is well-developed and well-groomed. She is obese. She is not ill-appearing, toxic-appearing or diaphoretic.  Cardiovascular:     Rate and Rhythm: Normal rate and regular rhythm.     Pulses: Normal pulses.          Radial pulses are 2+ on the right side and 2+ on the left side.       Posterior tibial pulses are 2+ on the right side and 2+ on the left side.     Heart sounds: Normal heart sounds. No murmur heard.    No gallop.  Pulmonary:     Effort: Pulmonary effort is normal. No respiratory distress.     Breath sounds: Normal breath sounds. No stridor. No wheezing, rhonchi or rales.  Abdominal:     General: Abdomen is flat. Bowel sounds are normal. There is no distension.     Palpations: Abdomen is soft. There is no mass.      Tenderness: There is no abdominal tenderness. There is no guarding or rebound.     Hernia: No hernia is present.  Musculoskeletal:     Cervical back: Full passive range of motion without pain and neck supple.     Right lower leg: No edema.     Left lower leg: No edema.  Skin:    General: Skin is warm.     Capillary Refill: Capillary refill takes less than 2 seconds.  Neurological:     General: No focal deficit present.     Mental Status: She is alert, oriented to person, place, and time and easily aroused. Mental status is at baseline.     GCS: GCS eye subscore is 4. GCS verbal subscore is 5. GCS motor subscore is 6.     Motor: No weakness.  Psychiatric:        Attention and Perception: Attention and perception normal.        Mood and Affect: Mood and affect normal.        Speech: Speech normal.        Behavior: Behavior normal. Behavior is cooperative.        Thought Content: Thought content normal. Thought content does not include homicidal or suicidal ideation. Thought content does not include homicidal or suicidal plan.        Cognition and Memory: Cognition and memory normal.        Judgment: Judgment normal.     Results for orders placed or performed in visit on 05/13/22  CBC with Differential/Platelet  Result Value Ref Range   WBC 5.6 3.4 - 10.8 x10E3/uL   RBC 4.38 3.77 - 5.28 x10E6/uL   Hemoglobin 12.9 11.1 - 15.9 g/dL   Hematocrit 65.7 84.6 - 46.6 %   MCV 90 79 - 97 fL   MCH 29.5  26.6 - 33.0 pg   MCHC 32.8 31.5 - 35.7 g/dL   RDW 08.6 57.8 - 46.9 %   Platelets 266 150 - 450 x10E3/uL   Neutrophils 56 Not Estab. %   Lymphs 31 Not Estab. %   Monocytes 10 Not Estab. %   Eos 2 Not Estab. %   Basos 1 Not Estab. %   Neutrophils Absolute 3.1 1.4 - 7.0 x10E3/uL   Lymphocytes Absolute 1.7 0.7 - 3.1 x10E3/uL   Monocytes Absolute 0.6 0.1 - 0.9 x10E3/uL   EOS (ABSOLUTE) 0.1 0.0 - 0.4 x10E3/uL   Basophils Absolute 0.0 0.0 - 0.2 x10E3/uL   Immature Granulocytes 0 Not Estab. %    Immature Grans (Abs) 0.0 0.0 - 0.1 x10E3/uL  CMP14+EGFR  Result Value Ref Range   Glucose 87 70 - 99 mg/dL   BUN 17 8 - 27 mg/dL   Creatinine, Ser 6.29 0.57 - 1.00 mg/dL   eGFR 77 >52 WU/XLK/4.40   BUN/Creatinine Ratio 20 12 - 28   Sodium 144 134 - 144 mmol/L   Potassium 3.9 3.5 - 5.2 mmol/L   Chloride 108 (H) 96 - 106 mmol/L   CO2 20 20 - 29 mmol/L   Calcium 9.0 8.7 - 10.3 mg/dL   Total Protein 6.5 6.0 - 8.5 g/dL   Albumin 4.2 3.9 - 4.9 g/dL   Globulin, Total 2.3 1.5 - 4.5 g/dL   Albumin/Globulin Ratio 1.8 1.2 - 2.2   Bilirubin Total 0.3 0.0 - 1.2 mg/dL   Alkaline Phosphatase 122 (H) 44 - 121 IU/L   AST 21 0 - 40 IU/L   ALT 18 0 - 32 IU/L  Lipid panel  Result Value Ref Range   Cholesterol, Total 259 (H) 100 - 199 mg/dL   Triglycerides 102 (H) 0 - 149 mg/dL   HDL 56 >72 mg/dL   VLDL Cholesterol Cal 28 5 - 40 mg/dL   LDL Chol Calc (NIH) 536 (H) 0 - 99 mg/dL   Chol/HDL Ratio 4.6 (H) 0.0 - 4.4 ratio       10/03/2022    1:20 PM 08/12/2022    9:23 AM 07/03/2022    9:30 AM 05/13/2022    8:56 AM 04/02/2022    2:22 PM  Depression screen PHQ 2/9  Decreased Interest 0 0 0 0 0  Down, Depressed, Hopeless 0 0 0 0 0  PHQ - 2 Score 0 0 0 0 0  Altered sleeping 0      Tired, decreased energy 2      Change in appetite 0      Feeling bad or failure about yourself  0      Trouble concentrating 0      Moving slowly or fidgety/restless 0      Suicidal thoughts 0      PHQ-9 Score 2      Difficult doing work/chores Not difficult at all           10/03/2022    1:20 PM 02/10/2022    8:22 AM 01/14/2022    1:49 PM 11/07/2021    1:16 PM  GAD 7 : Generalized Anxiety Score  Nervous, Anxious, on Edge 0 0 0 0  Control/stop worrying 0 0 0 0  Worry too much - different things 0 0 0 0  Trouble relaxing 0 0 0 0  Restless 0 0 0 0  Easily annoyed or irritable 0 0 0 0  Afraid - awful might happen 0 0 0 0  Total GAD  7 Score 0 0 0 0  Anxiety Difficulty Not difficult at all  Not difficult at all  Not difficult at all   Pertinent labs & imaging results that were available during my care of the patient were reviewed by me and considered in my medical decision making.  Assessment & Plan:  Amy Gray was seen today for gi problem.  Diagnoses and all orders for this visit:  Nausea Will provide medication as below.  -     prochlorperazine (COMPAZINE) 10 MG tablet; Take 0.5 tablets (5 mg total) by mouth every 8 (eight) hours as needed for nausea or vomiting.  Slow transit constipation Reviewed note from 04/24/21 with GI, Velda Shell, [place]. Patient instructed at that time to take omeprazole with famotidine and miralax for chronic constipation. Will restart as below. Completed stool studies, all normal per review of labs 04/25/21. Recommend patient follow up with GI. Patient had KUB and CT Abd in march of this year. Imaging reviewed as below.  -     polyethylene glycol powder (GLYCOLAX/MIRALAX) 17 GM/SCOOP powder; Take 17 g by mouth daily.  Dyspepsia As above.  -     famotidine (PEPCID) 40 MG tablet; Take 1 tablet (40 mg total) by mouth daily.  DG Abd 2 Views  IMPRESSION: No cause for the patient's symptoms identified.   Electronically Signed   By: Gerome Sam III M.D.   On: 04/03/2022 14:02  CT ABDOMEN PELVIS W CONTRAST  IMPRESSION: 1. No acute abdominopelvic findings. Normal appendix. 2. Horseshoe configuration of the kidneys. No renal stone or hydronephrosis. 3. Trace free fluid within the pelvis, nonspecific and could be related to shunt catheter. 4. Aortic atherosclerosis (ICD10-I70.0).   Electronically Signed   By: Duanne Guess D.O.   On: 04/07/2022 16:28 Continue all other maintenance medications.  Follow up plan: Return if symptoms worsen or fail to improve.   Continue healthy lifestyle choices, including diet (rich in fruits, vegetables, and lean proteins, and low in salt and simple carbohydrates) and exercise (at least 30 minutes of moderate physical activity  daily).  Written and verbal instructions provided   The above assessment and management plan was discussed with the patient. The patient verbalized understanding of and has agreed to the management plan. Patient is aware to call the clinic if they develop any new symptoms or if symptoms persist or worsen. Patient is aware when to return to the clinic for a follow-up visit. Patient educated on when it is appropriate to go to the emergency department.   Neale Burly, DNP-FNP [facility] [address] [place], [place] [address] [phone]

## 2022-10-07 DIAGNOSIS — I4519 Other right bundle-branch block: Secondary | ICD-10-CM | POA: Diagnosis not present

## 2022-10-07 DIAGNOSIS — F419 Anxiety disorder, unspecified: Secondary | ICD-10-CM | POA: Diagnosis not present

## 2022-10-07 DIAGNOSIS — J471 Bronchiectasis with (acute) exacerbation: Secondary | ICD-10-CM | POA: Diagnosis not present

## 2022-10-07 DIAGNOSIS — J90 Pleural effusion, not elsewhere classified: Secondary | ICD-10-CM | POA: Diagnosis not present

## 2022-10-07 DIAGNOSIS — R0789 Other chest pain: Secondary | ICD-10-CM | POA: Diagnosis not present

## 2022-10-07 DIAGNOSIS — Z7901 Long term (current) use of anticoagulants: Secondary | ICD-10-CM | POA: Diagnosis not present

## 2022-10-07 DIAGNOSIS — I4510 Unspecified right bundle-branch block: Secondary | ICD-10-CM | POA: Diagnosis not present

## 2022-10-07 DIAGNOSIS — R0602 Shortness of breath: Secondary | ICD-10-CM | POA: Diagnosis not present

## 2022-10-07 DIAGNOSIS — Z86711 Personal history of pulmonary embolism: Secondary | ICD-10-CM | POA: Diagnosis not present

## 2022-10-07 DIAGNOSIS — R079 Chest pain, unspecified: Secondary | ICD-10-CM | POA: Diagnosis not present

## 2022-10-07 DIAGNOSIS — R9431 Abnormal electrocardiogram [ECG] [EKG]: Secondary | ICD-10-CM | POA: Diagnosis not present

## 2022-10-07 DIAGNOSIS — J479 Bronchiectasis, uncomplicated: Secondary | ICD-10-CM | POA: Diagnosis not present

## 2022-10-07 DIAGNOSIS — J45998 Other asthma: Secondary | ICD-10-CM | POA: Diagnosis not present

## 2022-10-09 DIAGNOSIS — M5416 Radiculopathy, lumbar region: Secondary | ICD-10-CM | POA: Diagnosis not present

## 2022-10-09 DIAGNOSIS — M5136 Other intervertebral disc degeneration, lumbar region: Secondary | ICD-10-CM | POA: Diagnosis not present

## 2022-10-13 ENCOUNTER — Encounter: Payer: Self-pay | Admitting: Family

## 2022-10-13 ENCOUNTER — Ambulatory Visit: Payer: Medicare Other

## 2022-10-13 VITALS — BP 107/66 | HR 77 | Temp 97.9°F | Ht 62.0 in | Wt 163.6 lb

## 2022-10-13 DIAGNOSIS — J471 Bronchiectasis with (acute) exacerbation: Secondary | ICD-10-CM | POA: Diagnosis not present

## 2022-10-13 DIAGNOSIS — E876 Hypokalemia: Secondary | ICD-10-CM | POA: Diagnosis not present

## 2022-10-13 DIAGNOSIS — Z09 Encounter for follow-up examination after completed treatment for conditions other than malignant neoplasm: Secondary | ICD-10-CM

## 2022-10-13 DIAGNOSIS — R531 Weakness: Secondary | ICD-10-CM

## 2022-10-13 LAB — CMP14+EGFR
ALT: 21 IU/L (ref 0–32)
AST: 14 IU/L (ref 0–40)
Albumin: 4.4 g/dL (ref 3.9–4.9)
Alkaline Phosphatase: 126 IU/L — ABNORMAL HIGH (ref 44–121)
BUN/Creatinine Ratio: 12 (ref 12–28)
BUN: 13 mg/dL (ref 8–27)
Bilirubin Total: 0.2 mg/dL (ref 0.0–1.2)
CO2: 21 mmol/L (ref 20–29)
Calcium: 9.6 mg/dL (ref 8.7–10.3)
Chloride: 107 mmol/L — ABNORMAL HIGH (ref 96–106)
Creatinine, Ser: 1.05 mg/dL — ABNORMAL HIGH (ref 0.57–1.00)
Globulin, Total: 2.4 g/dL (ref 1.5–4.5)
Glucose: 108 mg/dL — ABNORMAL HIGH (ref 70–99)
Potassium: 3.4 mmol/L — ABNORMAL LOW (ref 3.5–5.2)
Sodium: 141 mmol/L (ref 134–144)
Total Protein: 6.8 g/dL (ref 6.0–8.5)
eGFR: 60 mL/min/{1.73_m2} (ref >59)

## 2022-10-13 LAB — CBC WITH DIFFERENTIAL/PLATELET
Basophils Absolute: 0 10*3/uL (ref 0.0–0.2)
Basos: 0 %
EOS (ABSOLUTE): 0 10*3/uL (ref 0.0–0.4)
Eos: 0 %
Hematocrit: 41.8 % (ref 34.0–46.6)
Hemoglobin: 13.4 g/dL (ref 11.1–15.9)
Immature Grans (Abs): 0.1 10*3/uL (ref 0.0–0.1)
Immature Granulocytes: 1 %
Lymphocytes Absolute: 4.4 10*3/uL — ABNORMAL HIGH (ref 0.7–3.1)
Lymphs: 46 %
MCH: 30 pg (ref 26.6–33.0)
MCHC: 32.1 g/dL (ref 31.5–35.7)
MCV: 94 fL (ref 79–97)
Monocytes Absolute: 0.7 10*3/uL (ref 0.1–0.9)
Monocytes: 8 %
Neutrophils Absolute: 4.3 10*3/uL (ref 1.4–7.0)
Neutrophils: 45 %
Platelets: 273 10*3/uL (ref 150–450)
RBC: 4.47 x10E6/uL (ref 3.77–5.28)
RDW: 13.4 % (ref 11.7–15.4)
WBC: 9.5 10*3/uL (ref 3.4–10.8)

## 2022-10-13 NOTE — Patient Instructions (Signed)
Bronchiectasis  Bronchiectasis is a condition in which the airways in the lungs (bronchi) are damaged and widened. The condition makes it hard for the lungs to get rid of mucus, and it causes mucus to gather in the bronchi. This condition often leads to lung infections, which can make the condition worse. What are the causes? You can be born with this condition, or you can develop it later in life. Common causes of this condition include: Cystic fibrosis. Repeated lung infections, such as pneumonia or tuberculosis. An object or other blockage in the lungs. Breathing in fluid, food, or other objects (aspiration). A problem with the body's defense system (immune system) and lung structure that is present at birth (congenital). Sometimes the cause is not known. What are the signs or symptoms? Common symptoms of this condition include: A daily cough that brings up mucus and lasts for more than 3 weeks. Lung infections that happen often. Shortness of breath and wheezing. Weakness and feeling tired (fatigue). How is this diagnosed? This condition is diagnosed with tests, such as: Chest X-rays or CT scans. These are done to check for changes in the lungs. Breathing tests. These are done to check how well your lungs are working. A test of a sample of your saliva (sputum culture). This test is done to check for infection. Blood tests and other tests. These are done to check for related diseases or causes. How is this treated? Treatment for this condition depends on the severity of the illness and its cause. Treatment may include: Medicines that loosen mucus so it can be coughed up (mucolytics). Medicines that relax the muscles of the bronchi (bronchodilators). Antibiotic medicines to prevent or treat infection. Physical therapy to help clear mucus from the lungs. Techniques may include: Postural drainage. This is when you sit or lie in certain positions so that mucus can drain by gravity. Chest  percussion. This involves tapping the chest or back with a cupped hand. Chest vibration. For this therapy, a hand or special equipment vibrates your chest and back. Surgery to remove the affected part of the lung. This may be done in severe cases. Follow these instructions at home: Medicines Take over-the-counter and prescription medicines only as told by your health care provider. If you were prescribed an antibiotic medicine, take it as told by your health care provider. Do not stop taking the antibiotic even if you start to feel better. Avoid taking sedatives and antihistamines unless your health care provider tells you to take them. These medicines tend to thicken the mucus in the lungs. Managing symptoms Do breathing exercises or techniques to clear your lungs as told by your health care provider. Consider using a cold steam vaporizer or humidifier in your room or home to help loosen secretions. If you have a cough that gets worse at night, try sleeping in a semi-upright position. General instructions Get plenty of rest. Drink enough fluid to keep your urine pale yellow. Stay inside when pollution and ozone levels are high. Stay up to date with vaccinations and immunizations. Avoid cigarette smoke and other lung irritants. Do not use any products that contain nicotine or tobacco. These products include cigarettes, chewing tobacco, and vaping devices, such as e-cigarettes. If you need help quitting, ask your health care provider. Keep all follow-up visits. This is important. Contact a health care provider if: You cough up more sputum than before and the sputum is yellow or green in color. You have a fever or chills. You cannot control your  cough and are losing sleep. Get help right away if: You cough up blood. You have chest pain. You have increasing shortness of breath. You have pain that gets worse or is not controlled with medicines. You have a fever and your symptoms suddenly get  worse. These symptoms may be an emergency. Get help right away. Call 911. Do not wait to see if the symptoms will go away. Do not drive yourself to the hospital. Summary Bronchiectasis is a condition in which the airways in the lungs (bronchi) are damaged and widened. The condition makes it hard for the lungs to get rid of mucus, and it causes mucus to gather in the bronchi. Treatment usually includes therapy to help clear mucus from the lungs. Avoid cigarette smoke and other lung irritants. Stay up to date with vaccinations and immunizations. This information is not intended to replace advice given to you by your health care provider. Make sure you discuss any questions you have with your health care provider. Document Revised: 09/19/2020 Document Reviewed: 08/07/2020 Elsevier Patient Education  2024 [employer].

## 2022-10-13 NOTE — Progress Notes (Signed)
Subjective:    Patient ID: Amy Gray, female    DOB: 06-10-1960, 62 y.o.   MRN: [id number]  Chief Complaint  Patient presents with   ER follow up    Pt presents to the office today for ED follow up. She went to the ED on 10/07/22 for left sided chest pain.   She had negative chest x-ray and Ct angio,  EKG. She was diagnosed with bronchiectasis and given prednisone dose pack. She reports her chest pain has resolved, but continues to feel weak. She did have hypokalemia on her labs.   She is followed by Pulmonologist for asthma and bronchiectasis. Has a follow up with them 11/04/22. Cough This is a new problem. The current episode started 1 to 4 weeks ago. The problem has been rapidly improving. The problem occurs every few minutes. The cough is Non-productive. Associated symptoms include shortness of breath. Pertinent negatives include no chills, ear congestion, ear pain, fever, headaches, myalgias, nasal congestion, postnasal drip or wheezing.      Review of Systems  Constitutional:  Negative for chills and fever.  HENT:  Negative for ear pain and postnasal drip.   Respiratory:  Positive for cough and shortness of breath. Negative for wheezing.   Musculoskeletal:  Negative for myalgias.  Neurological:  Negative for headaches.  All other systems reviewed and are negative.      Objective:   Physical Exam Vitals reviewed.  Constitutional:      General: She is not in acute distress.    Appearance: She is well-developed.  HENT:     Head: Normocephalic and atraumatic.  Eyes:     Pupils: Pupils are equal, round, and reactive to light.  Neck:     Thyroid: No thyromegaly.  Cardiovascular:     Rate and Rhythm: Normal rate and regular rhythm.     Heart sounds: Normal heart sounds. No murmur heard. Pulmonary:     Effort: Pulmonary effort is normal. No respiratory distress.     Breath sounds: Rhonchi present. No wheezing.  Abdominal:     General: Bowel sounds are normal.  There is no distension.     Palpations: Abdomen is soft.     Tenderness: There is no abdominal tenderness.  Musculoskeletal:        General: No tenderness. Normal range of motion.     Cervical back: Normal range of motion and neck supple.  Skin:    General: Skin is warm and dry.  Neurological:     Mental Status: She is alert and oriented to person, place, and time.     Cranial Nerves: No cranial nerve deficit.     Deep Tendon Reflexes: Reflexes are normal and symmetric.  Psychiatric:        Behavior: Behavior normal.        Thought Content: Thought content normal.        Judgment: Judgment normal.    BP 107/66   Pulse 77   Temp 97.9 F (36.6 C) (Temporal)   Ht 5\' 2"  (1.575 m)   Wt 163 lb 9.6 oz (74.2 kg)   SpO2 96%   BMI 29.92 kg/m        Assessment & Plan:  Amy Gray comes in today with chief complaint of ER follow up    Diagnosis and orders addressed:  1. Bronchiectasis with (acute) exacerbation (HCC) - CBC with Differential/Platelet - CMP14+EGFR  2. Hospital discharge follow-up - CBC with Differential/Platelet - CMP14+EGFR  3. Weakness - CBC with Differential/Platelet -  CMP14+EGFR  4. Hypokalemia - CBC with Differential/Platelet - CMP14+EGFR   Labs pending Hospital notes reviewed  Continue Zpak three times a week Will finish prednisone tomorrow  Keep Pulmonologist follow up Continue albuterol, Breztri and pulmicort  Jannifer Rodney, FNP

## 2022-10-28 DIAGNOSIS — J310 Chronic rhinitis: Secondary | ICD-10-CM | POA: Diagnosis not present

## 2022-10-28 DIAGNOSIS — J479 Bronchiectasis, uncomplicated: Secondary | ICD-10-CM | POA: Diagnosis not present

## 2022-10-28 DIAGNOSIS — J4550 Severe persistent asthma, uncomplicated: Secondary | ICD-10-CM | POA: Diagnosis not present

## 2022-10-28 DIAGNOSIS — J398 Other specified diseases of upper respiratory tract: Secondary | ICD-10-CM | POA: Diagnosis not present

## 2022-10-28 DIAGNOSIS — J8283 Eosinophilic asthma: Secondary | ICD-10-CM | POA: Diagnosis not present

## 2022-10-30 DIAGNOSIS — R109 Unspecified abdominal pain: Secondary | ICD-10-CM | POA: Diagnosis not present

## 2022-11-04 DIAGNOSIS — Z09 Encounter for follow-up examination after completed treatment for conditions other than malignant neoplasm: Secondary | ICD-10-CM | POA: Diagnosis not present

## 2022-11-04 DIAGNOSIS — Z9989 Dependence on other enabling machines and devices: Secondary | ICD-10-CM | POA: Diagnosis not present

## 2022-11-04 DIAGNOSIS — J4550 Severe persistent asthma, uncomplicated: Secondary | ICD-10-CM | POA: Diagnosis not present

## 2022-11-04 DIAGNOSIS — J471 Bronchiectasis with (acute) exacerbation: Secondary | ICD-10-CM | POA: Diagnosis not present

## 2022-11-04 DIAGNOSIS — G4733 Obstructive sleep apnea (adult) (pediatric): Secondary | ICD-10-CM | POA: Diagnosis not present

## 2022-11-11 DIAGNOSIS — E669 Obesity, unspecified: Secondary | ICD-10-CM | POA: Diagnosis not present

## 2022-11-11 DIAGNOSIS — M1990 Unspecified osteoarthritis, unspecified site: Secondary | ICD-10-CM | POA: Diagnosis not present

## 2022-11-11 DIAGNOSIS — G43909 Migraine, unspecified, not intractable, without status migrainosus: Secondary | ICD-10-CM | POA: Diagnosis not present

## 2022-11-11 DIAGNOSIS — K219 Gastro-esophageal reflux disease without esophagitis: Secondary | ICD-10-CM | POA: Diagnosis not present

## 2022-11-11 DIAGNOSIS — J398 Other specified diseases of upper respiratory tract: Secondary | ICD-10-CM | POA: Diagnosis not present

## 2022-11-11 DIAGNOSIS — J4550 Severe persistent asthma, uncomplicated: Secondary | ICD-10-CM | POA: Diagnosis not present

## 2022-11-11 DIAGNOSIS — Q600 Renal agenesis, unilateral: Secondary | ICD-10-CM | POA: Diagnosis not present

## 2022-11-11 DIAGNOSIS — E785 Hyperlipidemia, unspecified: Secondary | ICD-10-CM | POA: Diagnosis not present

## 2022-11-13 ENCOUNTER — Encounter: Payer: Medicare Other | Admitting: Family Medicine

## 2022-11-14 ENCOUNTER — Other Ambulatory Visit: Payer: Self-pay | Admitting: Family Medicine

## 2022-11-18 DIAGNOSIS — G43719 Chronic migraine without aura, intractable, without status migrainosus: Secondary | ICD-10-CM | POA: Diagnosis not present

## 2022-11-20 DIAGNOSIS — Q631 Lobulated, fused and horseshoe kidney: Secondary | ICD-10-CM | POA: Diagnosis not present

## 2022-11-20 DIAGNOSIS — M5416 Radiculopathy, lumbar region: Secondary | ICD-10-CM | POA: Diagnosis not present

## 2022-11-20 DIAGNOSIS — M51362 Other intervertebral disc degeneration, lumbar region with discogenic back pain and lower extremity pain: Secondary | ICD-10-CM | POA: Diagnosis not present

## 2022-11-20 DIAGNOSIS — K7689 Other specified diseases of liver: Secondary | ICD-10-CM | POA: Diagnosis not present

## 2022-11-26 ENCOUNTER — Other Ambulatory Visit: Payer: Self-pay | Admitting: Family Medicine

## 2022-11-26 DIAGNOSIS — F411 Generalized anxiety disorder: Secondary | ICD-10-CM

## 2022-11-26 DIAGNOSIS — J1282 Pneumonia due to coronavirus disease 2019: Secondary | ICD-10-CM

## 2022-12-15 ENCOUNTER — Ambulatory Visit: Payer: Medicare Other | Admitting: Family Medicine

## 2022-12-15 ENCOUNTER — Encounter: Payer: Self-pay | Admitting: Family Medicine

## 2022-12-15 VITALS — BP 107/64 | HR 74 | Temp 97.5°F | Ht 60.75 in | Wt 166.4 lb

## 2022-12-15 DIAGNOSIS — F411 Generalized anxiety disorder: Secondary | ICD-10-CM

## 2022-12-15 DIAGNOSIS — R5383 Other fatigue: Secondary | ICD-10-CM

## 2022-12-15 DIAGNOSIS — G932 Benign intracranial hypertension: Secondary | ICD-10-CM

## 2022-12-15 DIAGNOSIS — Z0000 Encounter for general adult medical examination without abnormal findings: Secondary | ICD-10-CM | POA: Diagnosis not present

## 2022-12-15 DIAGNOSIS — I700 Atherosclerosis of aorta: Secondary | ICD-10-CM | POA: Diagnosis not present

## 2022-12-15 DIAGNOSIS — M797 Fibromyalgia: Secondary | ICD-10-CM

## 2022-12-15 DIAGNOSIS — E559 Vitamin D deficiency, unspecified: Secondary | ICD-10-CM

## 2022-12-15 DIAGNOSIS — Z0001 Encounter for general adult medical examination with abnormal findings: Secondary | ICD-10-CM

## 2022-12-15 DIAGNOSIS — J479 Bronchiectasis, uncomplicated: Secondary | ICD-10-CM

## 2022-12-15 DIAGNOSIS — E782 Mixed hyperlipidemia: Secondary | ICD-10-CM | POA: Diagnosis not present

## 2022-12-15 LAB — URINALYSIS
Bilirubin, UA: NEGATIVE
Glucose, UA: NEGATIVE
Ketones, UA: NEGATIVE
Leukocytes,UA: NEGATIVE
Nitrite, UA: NEGATIVE
Protein,UA: NEGATIVE
RBC, UA: NEGATIVE
Specific Gravity, UA: 1.01 (ref 1.005–1.030)
Urobilinogen, Ur: 0.2 mg/dL (ref 0.2–1.0)
pH, UA: 5.5 (ref 5.0–7.5)

## 2022-12-15 MED ORDER — AZITHROMYCIN 500 MG PO TABS: 500.0000 mg | ORAL_TABLET | ORAL | Status: AC

## 2022-12-15 NOTE — Progress Notes (Signed)
Subjective:  Patient ID: Amy Gray, female    DOB: [birth year]  Age: 62 y.o. MRN: [id number]  CC: Annual Exam   HPI Amy Gray presents for Annual Physical.     12/15/2022    9:06 AM 10/03/2022    1:20 PM 08/12/2022    9:23 AM  Depression screen PHQ 2/9  Decreased Interest 0 0 0  Down, Depressed, Hopeless 0 0 0  PHQ - 2 Score 0 0 0  Altered sleeping  0   Tired, decreased energy  2   Change in appetite  0   Feeling bad or failure about yourself   0   Trouble concentrating  0   Moving slowly or fidgety/restless  0   Suicidal thoughts  0   PHQ-9 Score  2   Difficult doing work/chores  Not difficult at all     History Amy Gray has a past medical history of Anxiety, Asthma, COPD (chronic obstructive pulmonary disease) (HCC), Depression, Emphysema of lung (HCC), GERD (gastroesophageal reflux disease), Hyperlipidemia, Migraines, Osteoarthritis, and Sleep apnea.   She has a past surgical history that includes Cervical fusion (01/20/2013); Rhinoplasty; Abdominal hysterectomy; Brain surgery (05/2016); Spine surgery (2015); and Tubal ligation (1986).   Her family history includes Arthritis in her father and mother; Asthma in her mother; Heart attack in her father; Heart disease in her father and mother; Hypertension in her sister.She reports that she has never smoked. She has never used smokeless tobacco. She reports that she does not drink alcohol and does not use drugs.    ROS Review of Systems  Constitutional:  Positive for fatigue. Negative for appetite change, chills, diaphoresis, fever and unexpected weight change.  HENT: Negative.  Negative for congestion, ear pain, hearing loss, postnasal drip, rhinorrhea, sneezing, sore throat and trouble swallowing.   Eyes:  Negative for pain and visual disturbance.  Respiratory:  Positive for cough and shortness of breath. Negative for chest tightness.   Cardiovascular:  Negative for chest pain and palpitations.   Gastrointestinal:  Negative for abdominal pain, constipation, diarrhea, nausea and vomiting.  Endocrine: Negative for cold intolerance, heat intolerance, polydipsia, polyphagia and polyuria.  Genitourinary:  Negative for dysuria, frequency and menstrual problem.  Musculoskeletal:  Positive for back pain (recent ESI. Still having Left sided radiculopathy). Negative for arthralgias and joint swelling.  Skin:  Negative for rash.       Hair loss for several months.  Allergic/Immunologic: Negative for environmental allergies.  Neurological:  Negative for dizziness, weakness, numbness and headaches.  Psychiatric/Behavioral:  Negative for agitation and dysphoric mood.     Objective:  BP 107/64   Pulse 74   Temp (!) 97.5 F (36.4 C)   Ht 5' 0.75" (1.543 m)   Wt 166 lb 6.4 oz (75.5 kg)   SpO2 97%   BMI 31.70 kg/m   BP Readings from Last 3 Encounters:  12/15/22 107/64  10/13/22 107/66  10/03/22 97/64    Wt Readings from Last 3 Encounters:  12/15/22 166 lb 6.4 oz (75.5 kg)  10/13/22 163 lb 9.6 oz (74.2 kg)  10/03/22 167 lb (75.8 kg)     Physical Exam Constitutional:      General: She is not in acute distress.    Appearance: She is well-developed.  HENT:     Head: Normocephalic and atraumatic.  Eyes:     Conjunctiva/sclera: Conjunctivae normal.     Pupils: Pupils are equal, round, and reactive to light.  Neck:     Thyroid: No thyromegaly.  Cardiovascular:     Rate and Rhythm: Normal rate and regular rhythm.     Heart sounds: Normal heart sounds. No murmur heard. Pulmonary:     Effort: Pulmonary effort is normal. No respiratory distress.     Breath sounds: Normal breath sounds. No wheezing or rales.  Abdominal:     General: Bowel sounds are normal. There is no distension.     Palpations: Abdomen is soft.     Tenderness: There is no abdominal tenderness.  Musculoskeletal:        General: Normal range of motion.     Cervical back: Normal range of motion and neck supple.   Lymphadenopathy:     Cervical: No cervical adenopathy.  Skin:    General: Skin is warm and dry.  Neurological:     Mental Status: She is alert and oriented to person, place, and time.  Psychiatric:        Behavior: Behavior normal.        Thought Content: Thought content normal.        Judgment: Judgment normal.       Assessment & Plan:   Amy Gray was seen today for annual exam.  Diagnoses and all orders for this visit:  Well adult exam -     CBC with Differential/Platelet -     CMP14+EGFR -     Urinalysis  Bronchiectasis without complication (HCC) -     CBC with Differential/Platelet -     CMP14+EGFR -     Urinalysis  Vitamin D deficiency -     CBC with Differential/Platelet -     CMP14+EGFR -     Urinalysis -     VITAMIN D 25 Hydroxy (Vit-D Deficiency, Fractures)  IIH (idiopathic intracranial hypertension) -     CBC with Differential/Platelet -     CMP14+EGFR -     Urinalysis  Fibromyalgia syndrome -     CBC with Differential/Platelet -     CMP14+EGFR -     Urinalysis  Mixed hyperlipidemia -     CBC with Differential/Platelet -     CMP14+EGFR -     Lipid panel -     Urinalysis  Generalized anxiety disorder -     CBC with Differential/Platelet -     CMP14+EGFR -     Urinalysis  Abdominal aortic atherosclerosis (HCC) -     CBC with Differential/Platelet -     CMP14+EGFR -     Urinalysis  Fatigue, unspecified type -     TSH  Other orders -     azithromycin (ZITHROMAX) 500 MG tablet; Take 1 tablet (500 mg total) by mouth 3 (three) times a week.       I have changed Amy Gray's azithromycin. I am also having her maintain her Vitamin D, acetaZOLAMIDE, Botox, Ubrogepant (UBRELVY PO), baclofen, traZODone, budesonide, Breztri Aerosphere, omeprazole, rOPINIRole, rosuvastatin, cyanocobalamin, montelukast, tezepelumab-ekko, famotidine, polyethylene glycol powder, prochlorperazine, albuterol, sertraline, albuterol, and zonisamide.  Allergies as  of 12/15/2022       Reactions   Pregabalin Shortness Of Breath   Chlorzoxazone Other (See Comments)   Decrease in BP    Diclofenac Other (See Comments)   Other reaction(s): Other Other reaction(s): Other Update Update Update Update Other reaction(s): Other Other reaction(s): Other Update Update Update Update   Metoclopramide Other (See Comments)   Restless legs   Sibutramine    Other reaction(s): Other update   Topiramate Other (See Comments)   Venlafaxine Other (See Comments)   Restless  legs   Amitriptyline    Duloxetine Hcl    Guaifenesin Er    Oxybutynin    Gabapentin    Other reaction(s): Confusion   Propranolol Anxiety        Medication List        Accurate as of December 15, 2022 10:15 AM. If you have any questions, ask your nurse or doctor.          acetaZOLAMIDE 250 MG tablet Commonly known as: DIAMOX Take 250 mg by mouth daily.   albuterol (2.5 MG/3ML) 0.083% nebulizer solution Commonly known as: PROVENTIL Take 3 mLs(1 VIAL) (2.5 mg total) by nebulization every 4 (four) hours as needed for wheezing or shortness of breath.   albuterol 108 (90 Base) MCG/ACT inhaler Commonly known as: VENTOLIN HFA Inhale 2 puffs intothe lungs every 6 (six) hours as needed for wheezing or shortness of breath.   azithromycin 500 MG tablet Commonly known as: ZITHROMAX Take 1 tablet (500 mg total) by mouth 3 (three) times a week. What changed:  how much to take when to take this Changed by: Aubria Vanecek   baclofen 10 MG tablet Commonly known as: LIORESAL Take 10 mg by mouth 3 (three) times daily.   Botox 200 units injection Generic drug: botulinum toxin Type A   Breztri Aerosphere 160-9-4.8 MCG/ACT Aero Generic drug: Budeson-Glycopyrrol-Formoterol Inhale 2 puffs into the lungs in the morning and at bedtime.   budesonide 0.25 MG/2ML nebulizer solution Commonly known as: PULMICORT Take 2 mLs (0.25 mg total) by nebulization 2 (two) times daily as needed  (shortness of breath).   cyanocobalamin 1000 MCG/ML injection Commonly known as: VITAMIN B12 Inject 1 mL (1,000 mcg total) into the skin every 30 (thirty) days.   famotidine 40 MG tablet Commonly known as: PEPCID Take 1 tablet (40 mg total) by mouth daily.   montelukast 10 MG tablet Commonly known as: SINGULAIR Take 1 tablet (10 mg total) by mouth at bedtime.   omeprazole 40 MG capsule Commonly known as: PRILOSEC Take 1 capsule (40 mg total) by mouth 2 (two) times daily.   polyethylene glycol powder 17 GM/SCOOP powder Commonly known as: GLYCOLAX/MIRALAX Take 17 g by mouth daily.   prochlorperazine 10 MG tablet Commonly known as: COMPAZINE Take 0.5 tablets (5 mg total) by mouth every 8 (eight) hours as needed for nausea or vomiting.   rOPINIRole 4 MG tablet Commonly known as: REQUIP Take 1 tablet (4 mg total) by mouth at bedtime.   rosuvastatin 10 MG tablet Commonly known as: CRESTOR Take 1 tablet (10 mg total) by mouth daily.   sertraline 100 MG tablet Commonly known as: ZOLOFT Take 1 tablet (100 mg total) by mouth at bedtime.   tezepelumab-ekko 210 MG/1. syringe Commonly known as: TEZSPIRE   traZODone 150 MG tablet Commonly known as: DESYREL TAKE 1/3 (ONE-THIRD) to 1 (ONE) tablet BY MOUTH nightly as needed for sleep.   UBRELVY PO Take by mouth.   Vitamin D 50 MCG (2000 UT) Caps Take 5,000 Units by mouth daily.   zonisamide 100 MG capsule Commonly known as: ZONEGRAN Take 200 mg by mouth at bedtime. What changed: Another medication with the same name was removed. Continue taking this medication, and follow the directions you see here. Changed by: Broadus John Annia Gomm         Follow-up: Return in about 6 months (around 06/14/2023).  Mechele Claude, M.D.

## 2022-12-16 ENCOUNTER — Other Ambulatory Visit: Payer: Self-pay | Admitting: Family Medicine

## 2022-12-16 DIAGNOSIS — J1282 Pneumonia due to coronavirus disease 2019: Secondary | ICD-10-CM

## 2022-12-16 LAB — CMP14+EGFR
ALT: 18 [IU]/L (ref 0–32)
AST: 25 [IU]/L (ref 0–40)
Albumin: 4.5 g/dL (ref 3.9–4.9)
Alkaline Phosphatase: 131 [IU]/L — ABNORMAL HIGH (ref 44–121)
BUN/Creatinine Ratio: 16 (ref 12–28)
BUN: 16 mg/dL (ref 8–27)
Bilirubin Total: 0.4 mg/dL (ref 0.0–1.2)
CO2: 21 mmol/L (ref 20–29)
Calcium: 10 mg/dL (ref 8.7–10.3)
Chloride: 106 mmol/L (ref 96–106)
Creatinine, Ser: 0.99 mg/dL (ref 0.57–1.00)
Globulin, Total: 2.2 g/dL (ref 1.5–4.5)
Glucose: 89 mg/dL (ref 70–99)
Potassium: 4.2 mmol/L (ref 3.5–5.2)
Sodium: 142 mmol/L (ref 134–144)
Total Protein: 6.7 g/dL (ref 6.0–8.5)
eGFR: 64 mL/min/{1.73_m2} (ref >59)

## 2022-12-16 LAB — CBC WITH DIFFERENTIAL/PLATELET
Basophils Absolute: 0 10*3/uL (ref 0.0–0.2)
Basos: 1 %
EOS (ABSOLUTE): 0.1 10*3/uL (ref 0.0–0.4)
Eos: 1 %
Hematocrit: 41.7 % (ref 34.0–46.6)
Hemoglobin: 13.4 g/dL (ref 11.1–15.9)
Immature Grans (Abs): 0 10*3/uL (ref 0.0–0.1)
Immature Granulocytes: 0 %
Lymphocytes Absolute: 1.8 10*3/uL (ref 0.7–3.1)
Lymphs: 31 %
MCH: 29.9 pg (ref 26.6–33.0)
MCHC: 32.1 g/dL (ref 31.5–35.7)
MCV: 93 fL (ref 79–97)
Monocytes Absolute: 0.6 10*3/uL (ref 0.1–0.9)
Monocytes: 11 %
Neutrophils Absolute: 3.3 10*3/uL (ref 1.4–7.0)
Neutrophils: 56 %
Platelets: 253 10*3/uL (ref 150–450)
RBC: 4.48 x10E6/uL (ref 3.77–5.28)
RDW: 13.5 % (ref 11.7–15.4)
WBC: 5.8 10*3/uL (ref 3.4–10.8)

## 2022-12-16 LAB — VITAMIN D 25 HYDROXY (VIT D DEFICIENCY, FRACTURES): Vit D, 25-Hydroxy: 64.1 ng/mL (ref 30.0–100.0)

## 2022-12-16 LAB — LIPID PANEL
Chol/HDL Ratio: 2.3 {ratio} (ref 0.0–4.4)
Cholesterol, Total: 198 mg/dL (ref 100–199)
HDL: 88 mg/dL (ref >39)
LDL Chol Calc (NIH): 96 mg/dL (ref 0–99)
Triglycerides: 78 mg/dL (ref 0–149)
VLDL Cholesterol Cal: 14 mg/dL (ref 5–40)

## 2022-12-16 LAB — TSH: TSH: 2.29 u[IU]/mL (ref 0.450–4.500)

## 2022-12-16 NOTE — Progress Notes (Signed)
Hello Vicktoria,  Your lab result is normal and/or stable.Some minor variations that are not significant are commonly marked abnormal, but do not represent any medical problem for you.  Best regards, Mechele Claude, M.D.

## 2022-12-31 DIAGNOSIS — Z9989 Dependence on other enabling machines and devices: Secondary | ICD-10-CM | POA: Diagnosis not present

## 2022-12-31 DIAGNOSIS — J471 Bronchiectasis with (acute) exacerbation: Secondary | ICD-10-CM | POA: Diagnosis not present

## 2022-12-31 DIAGNOSIS — G4733 Obstructive sleep apnea (adult) (pediatric): Secondary | ICD-10-CM | POA: Diagnosis not present

## 2022-12-31 DIAGNOSIS — J4550 Severe persistent asthma, uncomplicated: Secondary | ICD-10-CM | POA: Diagnosis not present

## 2023-01-05 NOTE — Progress Notes (Deleted)
   Acute Office Visit  Subjective:     Patient ID: Amy Gray, female    DOB: 09/25/1960, 62 y.o.   MRN: [id number]  No chief complaint on file.   HPI SUBJECTIVE:  Amy Gray is a 62 y.o. female who complains of {uri sx:315001} for *** days. She denies a history of {hx resp sx additional:315009} and {has/denies:315300} a history of asthma. Patient {has/denies:315300} smoke cigarettes.   OBJECTIVE: She appears well, vital signs are as noted. Ears normal.  Throat and pharynx normal.  Neck supple. No adenopathy in the neck. Nose is congested. Sinuses non tender. The chest is clear, without wheezes or rales.  ASSESSMENT:  {uri dx:315273::viral upper respiratory illness}  PLAN: Symptomatic therapy suggested: {resp plan:315236}. Lack of antibiotic effectiveness discussed with her. Call or return to clinic prn if these symptoms worsen or fail to improve as anticipated.  ROS Negative unless indicated in HPI    Objective:    There were no vitals taken for this visit. BP Readings from Last 3 Encounters:  12/15/22 107/64  10/13/22 107/66  10/03/22 97/64   Wt Readings from Last 3 Encounters:  12/15/22 166 lb 6.4 oz (75.5 kg)  10/13/22 163 lb 9.6 oz (74.2 kg)  10/03/22 167 lb (75.8 kg)      Physical Exam  No results found for any visits on 01/06/23.      Assessment & Plan:  There are no diagnoses linked to this encounter. Continue healthy lifestyle choices, including diet (rich in fruits, vegetables, and lean proteins, and low in salt and simple carbohydrates) and exercise (at least 30 minutes of moderate physical activity daily).     The above assessment and management plan was discussed with the patient. The patient verbalized understanding of and has agreed to the management plan. Patient is aware to call the clinic if they develop any new symptoms or if symptoms persist or worsen. Patient is aware when to return to the clinic for a follow-up visit. Patient  educated on when it is appropriate to go to the emergency department.  No follow-ups on file.  Arrie Aran Santa Lighter, [facility] [address] [place], [place] [address] [phone]  Note: This document was prepared by [employer] voice dictation technology and any errors that results from this process are unintentional.

## 2023-01-06 ENCOUNTER — Ambulatory Visit: Payer: Medicare Other | Admitting: Nurse Practitioner

## 2023-01-09 ENCOUNTER — Ambulatory Visit (INDEPENDENT_AMBULATORY_CARE_PROVIDER_SITE_OTHER): Payer: Medicare Other | Admitting: Family Medicine

## 2023-01-09 ENCOUNTER — Ambulatory Visit: Payer: Self-pay | Admitting: Family Medicine

## 2023-01-09 ENCOUNTER — Encounter: Payer: Self-pay | Admitting: Family Medicine

## 2023-01-09 VITALS — BP 107/70 | HR 90 | Temp 97.8°F | Ht 60.75 in | Wt 166.4 lb

## 2023-01-09 DIAGNOSIS — R0989 Other specified symptoms and signs involving the circulatory and respiratory systems: Secondary | ICD-10-CM

## 2023-01-09 DIAGNOSIS — R051 Acute cough: Secondary | ICD-10-CM | POA: Diagnosis not present

## 2023-01-09 DIAGNOSIS — J479 Bronchiectasis, uncomplicated: Secondary | ICD-10-CM | POA: Diagnosis not present

## 2023-01-09 MED ORDER — PROMETHAZINE-DM 6.25-15 MG/5ML PO SYRP: 5.0000 mL | ORAL_SOLUTION | Freq: Four times a day (QID) | ORAL | 0 refills | Status: DC | PRN

## 2023-01-09 MED ORDER — DOXYCYCLINE HYCLATE 100 MG PO TABS: 100.0000 mg | ORAL_TABLET | Freq: Two times a day (BID) | ORAL | 0 refills | Status: AC

## 2023-01-09 MED ORDER — PREDNISONE 20 MG PO TABS: 40.0000 mg | ORAL_TABLET | Freq: Every day | ORAL | 0 refills | Status: AC

## 2023-01-09 NOTE — Progress Notes (Signed)
Subjective:  Patient ID: Amy Gray, female    DOB: Jun 18, 1960, 62 y.o.   MRN: [id number]  Patient Care Team: Mechele Claude, MD as PCP - General (Family Medicine) Gunnar Fusi, MD (Internal Medicine)   Chief Complaint:  Cough (Getting short of breath from coughing. Symptoms for 3 weeks), Sore Throat, and Nasal Congestion   HPI: Amy Gray is a 62 y.o. female presenting on 01/09/2023 for Cough (Getting short of breath from coughing. Symptoms for 3 weeks), Sore Throat, and Nasal Congestion States that she has been sick for 3 weeks. Talking makes cough worse. Started as nasal congestion and feels that it has "run" into her chest. Endorses sore throat and chest tightness, fatigue. States that the last week she had dry heaving. States that the cough is dry. Endorses nausea. States that she was seen by pulmonology last week and was told that her lungs sounded okay. She feels that she can "hear" herself in her upper airway. Reports history of TBM, bronchiectasis and eosinophilic asthma. Taking Breztri as prescribed. Nebulizer twice per day. Uses chest vest twice per day at baseline, she is trying to do it more, but she has not been able to increase due to taking care of her mother.   Relevant past medical, surgical, family, and social history reviewed and updated as indicated.  Allergies and medications reviewed and updated. Data reviewed: Chart in Epic.   Past Medical History:  Diagnosis Date   Anxiety    Asthma    COPD (chronic obstructive pulmonary disease) (HCC)    Depression    Emphysema of lung (HCC)    GERD (gastroesophageal reflux disease)    Hyperlipidemia    Migraines    Osteoarthritis    Sleep apnea    Very mild,use for moderate airway collapse    Past Surgical History:  Procedure Laterality Date   ABDOMINAL HYSTERECTOMY     BRAIN SURGERY  05/2016   CERVICAL FUSION  01/20/2013   C4 - C7   RHINOPLASTY     SPINE SURGERY  2015   TUBAL LIGATION  1986     Social History   Socioeconomic History   Marital status: Married    Spouse name: Not on file   Number of children: 2   Years of education: Not on file   Highest education level: 12th grade  Occupational History   Occupation: Disabled  Tobacco Use   Smoking status: Never   Smokeless tobacco: Never  Substance and Sexual Activity   Alcohol use: No   Drug use: No   Sexual activity: Not on file  Other Topics Concern   Not on file  Social History Narrative   Not on file   Social Drivers of Health   Financial Resource Strain: Low Risk  (08/12/2022)   Overall Financial Resource Strain (CARDIA)    Difficulty of Paying Living Expenses: Not hard at all  Food Insecurity: No Food Insecurity (08/12/2022)   Hunger Vital Sign    Worried About Running Out of Food in the Last Year: Never true    Ran Out of Food in the Last Year: Never true  Transportation Needs: No Transportation Needs (08/12/2022)   PRAPARE - Administrator, Civil Service (Medical): No    Lack of Transportation (Non-Medical): No  Physical Activity: Inactive (08/12/2022)   Exercise Vital Sign    Days of Exercise per Week: 0 days    Minutes of Exercise per Session: 0 min  Stress: No Stress Concern  Present (08/12/2022)   [employer] of Occupational Health - Occupational Stress Questionnaire    Feeling of Stress : Not at all  Social Connections: Moderately Integrated (08/12/2022)   Social Connection and Isolation Panel [NHANES]    Frequency of Communication with Friends and Family: More than three times a week    Frequency of Social Gatherings with Friends and Family: More than three times a week    Attends Religious Services: More than 4 times per year    Active Member of [employer] or Organizations: No    Attends Banker Meetings: Never    Marital Status: Married  Catering manager Violence: Not At Risk (10/07/2022)   Received from [facility]   HITS    Over the last 12 months how often did  your partner physically hurt you?: Never    Over the last 12 months how often did your partner insult you or talk down to you?: Never    Over the last 12 months how often did your partner threaten you with physical harm?: Never    Over the last 12 months how often did your partner scream or curse at you?: Never    Outpatient Encounter Medications as of 01/09/2023  Medication Sig   acetaZOLAMIDE (DIAMOX) 250 MG tablet Take 250 mg by mouth daily.   albuterol (PROVENTIL) (2.5 MG/3ML) 0.083% nebulizer solution Take 3 mLs(1 VIAL) (2.5 mg total) by nebulization every 4 (four) hours as needed for wheezing or shortness of breath.   albuterol (VENTOLIN HFA) 108 (90 Base) MCG/ACT inhaler Inhale 2 puffs into the lungs every 6 (six) hours as needed for wheezing or shortness of breath.   azithromycin (ZITHROMAX) 500 MG tablet Take 1 tablet (500 mg total) by mouth 3 (three) times a week.   baclofen (LIORESAL) 10 MG tablet Take 10 mg by mouth 3 (three) times daily.   BOTOX 200 units SOLR    Budeson-Glycopyrrol-Formoterol (BREZTRI AEROSPHERE) 160-9-4.8 MCG/ACT AERO Inhale 2 puffs into the lungs in the morning and at bedtime.   budesonide (PULMICORT) 0.25 MG/2ML nebulizer solution Take 2 mLs (0.25 mg total) by nebulization 2 (two) times daily as needed (shortness of breath).   Cholecalciferol (VITAMIN D) 2000 UNITS CAPS Take 5,000 Units by mouth daily.    cyanocobalamin (VITAMIN B12) 1000 MCG/ML injection Inject 1 mL (1,000 mcg total) into the skin every 30 (thirty) days.   famotidine (PEPCID) 40 MG tablet Take 1 tablet (40 mg total) by mouth daily.   montelukast (SINGULAIR) 10 MG tablet Take 1 tablet (10 mg total) by mouth at bedtime.   omeprazole (PRILOSEC) 40 MG capsule Take 1 capsule (40 mg total) by mouth 2 (two) times daily.   polyethylene glycol powder (GLYCOLAX/MIRALAX) 17 GM/SCOOP powder Take 17 g by mouth daily.   prochlorperazine (COMPAZINE) 10 MG tablet Take 0.5 tablets (5 mg total) by mouth every 8  (eight) hours as needed for nausea or vomiting.   rOPINIRole (REQUIP) 4 MG tablet Take 1 tablet (4 mg total) by mouth at bedtime.   rosuvastatin (CRESTOR) 10 MG tablet Take 1 tablet (10 mg total) by mouth daily.   sertraline (ZOLOFT) 100 MG tablet Take 1 tablet (100 mg total) by mouth at bedtime.   tezepelumab-ekko (TEZSPIRE) 210 MG/1. syringe    traZODone (DESYREL) 150 MG tablet TAKE 1/3 (ONE-THIRD) to 1 (ONE) tablet BY MOUTH nightly as needed for sleep.   Ubrogepant (UBRELVY PO) Take by mouth.   zonisamide (ZONEGRAN) 100 MG capsule Take 200 mg by mouth  at bedtime.   No facility-administered encounter medications on file as of 01/09/2023.    Allergies  Allergen Reactions   Pregabalin Shortness Of Breath   Chlorzoxazone Other (See Comments)    Decrease in BP    Diclofenac Other (See Comments)    Other reaction(s): Other Other reaction(s): Other Update Update Update Update Other reaction(s): Other Other reaction(s): Other Update Update Update Update    Metoclopramide Other (See Comments)    Restless legs   Sibutramine     Other reaction(s): Other update   Topiramate Other (See Comments)   Venlafaxine Other (See Comments)    Restless legs   Amitriptyline    Duloxetine Hcl    Guaifenesin Er    Oxybutynin    Gabapentin     Other reaction(s): Confusion   Propranolol Anxiety    Review of Systems As per HPI  Objective:  BP 107/70   Pulse 90   Temp 97.8 F (36.6 C) (Temporal)   Ht 5' 0.75" (1.543 m)   Wt 166 lb 6.4 oz (75.5 kg)   SpO2 97%   BMI 31.70 kg/m    Wt Readings from Last 3 Encounters:  01/09/23 166 lb 6.4 oz (75.5 kg)  12/15/22 166 lb 6.4 oz (75.5 kg)  10/13/22 163 lb 9.6 oz (74.2 kg)   Physical Exam Constitutional:      General: She is awake. She is not in acute distress.    Appearance: Normal appearance. She is well-developed and well-groomed. She is obese. She is ill-appearing. She is not toxic-appearing or diaphoretic.  HENT:     Right  Ear: No drainage, swelling or tenderness. A middle ear effusion is present. There is no impacted cerumen. No foreign body. No mastoid tenderness. No PE tube. No hemotympanum. Tympanic membrane is not injected, scarred, perforated, erythematous, retracted or bulging.     Left Ear: No drainage, swelling or tenderness. A middle ear effusion is present. There is no impacted cerumen. No foreign body. No mastoid tenderness. No PE tube. No hemotympanum. Tympanic membrane is not injected, scarred, perforated, erythematous, retracted or bulging.     Nose: Congestion and rhinorrhea present. Rhinorrhea is clear.     Right Sinus: Maxillary sinus tenderness and frontal sinus tenderness present.     Left Sinus: Maxillary sinus tenderness and frontal sinus tenderness present.     Mouth/Throat:     Lips: Pink. No lesions.     Mouth: Mucous membranes are moist.     Tongue: No lesions.     Palate: No mass.     Pharynx: Posterior oropharyngeal erythema and postnasal drip present. No pharyngeal swelling, oropharyngeal exudate or uvula swelling.     Tonsils: No tonsillar exudate or tonsillar abscesses. 3+ on the right. 3+ on the left.  Neck:     Thyroid: No thyroid mass, thyromegaly or thyroid tenderness.  Cardiovascular:     Rate and Rhythm: Normal rate and regular rhythm.     Pulses: Normal pulses.          Radial pulses are 2+ on the right side and 2+ on the left side.       Posterior tibial pulses are 2+ on the right side and 2+ on the left side.     Heart sounds: Normal heart sounds. No murmur heard.    No gallop.  Pulmonary:     Effort: Pulmonary effort is normal. No respiratory distress.     Breath sounds: No stridor, decreased air movement or transmitted upper airway sounds. Examination  of the left-lower field reveals rales. Rales present. No decreased breath sounds, wheezing or rhonchi.  Musculoskeletal:     Cervical back: Full passive range of motion without pain and neck supple.     Right lower leg:  No edema.     Left lower leg: No edema.  Lymphadenopathy:     Head:     Right side of head: No submental, submandibular, tonsillar, preauricular or posterior auricular adenopathy.     Left side of head: No submental, submandibular, tonsillar, preauricular or posterior auricular adenopathy.     Cervical: No cervical adenopathy.     Right cervical: No superficial, deep or posterior cervical adenopathy.    Left cervical: No superficial, deep or posterior cervical adenopathy.  Skin:    General: Skin is warm.     Capillary Refill: Capillary refill takes less than 2 seconds.  Neurological:     General: No focal deficit present.     Mental Status: She is alert, oriented to person, place, and time and easily aroused. Mental status is at baseline.     GCS: GCS eye subscore is 4. GCS verbal subscore is 5. GCS motor subscore is 6.     Motor: No weakness.  Psychiatric:        Attention and Perception: Attention and perception normal.        Mood and Affect: Mood and affect normal.        Speech: Speech normal.        Behavior: Behavior normal. Behavior is cooperative.        Thought Content: Thought content normal. Thought content does not include homicidal or suicidal ideation. Thought content does not include homicidal or suicidal plan.        Cognition and Memory: Cognition and memory normal.        Judgment: Judgment normal.    Results for orders placed or performed in visit on 12/15/22  CBC with Differential/Platelet   Collection Time: 12/15/22 10:19 AM  Result Value Ref Range   WBC 5.8 3.4 - 10.8 x10E3/uL   RBC 4.48 3.77 - 5.28 x10E6/uL   Hemoglobin 13.4 11.1 - 15.9 g/dL   Hematocrit 40.1 02.7 - 46.6 %   MCV 93 79 - 97 fL   MCH 29.9 26.6 - 33.0 pg   MCHC 32.1 31.5 - 35.7 g/dL   RDW 25.3 66.4 - 40.3 %   Platelets 253 150 - 450 x10E3/uL   Neutrophils 56 Not Estab. %   Lymphs 31 Not Estab. %   Monocytes 11 Not Estab. %   Eos 1 Not Estab. %   Basos 1 Not Estab. %   Neutrophils  Absolute 3.3 1.4 - 7.0 x10E3/uL   Lymphocytes Absolute 1.8 0.7 - 3.1 x10E3/uL   Monocytes Absolute 0.6 0.1 - 0.9 x10E3/uL   EOS (ABSOLUTE) 0.1 0.0 - 0.4 x10E3/uL   Basophils Absolute 0.0 0.0 - 0.2 x10E3/uL   Immature Granulocytes 0 Not Estab. %   Immature Grans (Abs) 0.0 0.0 - 0.1 x10E3/uL  CMP14+EGFR   Collection Time: 12/15/22 10:19 AM  Result Value Ref Range   Glucose 89 70 - 99 mg/dL   BUN 16 8 - 27 mg/dL   Creatinine, Ser 4.74 0.57 - 1.00 mg/dL   eGFR 64 >25 ZD/GLO/7.56   BUN/Creatinine Ratio 16 12 - 28   Sodium 142 134 - 144 mmol/L   Potassium 4.2 3.5 - 5.2 mmol/L   Chloride 106 96 - 106 mmol/L   CO2 21 20 - 29  mmol/L   Calcium 10.0 8.7 - 10.3 mg/dL   Total Protein 6.7 6.0 - 8.5 g/dL   Albumin 4.5 3.9 - 4.9 g/dL   Globulin, Total 2.2 1.5 - 4.5 g/dL   Bilirubin Total 0.4 0.0 - 1.2 mg/dL   Alkaline Phosphatase 131 (H) 44 - 121 IU/L   AST 25 0 - 40 IU/L   ALT 18 0 - 32 IU/L  Lipid panel   Collection Time: 12/15/22 10:19 AM  Result Value Ref Range   Cholesterol, Total 198 100 - 199 mg/dL   Triglycerides 78 0 - 149 mg/dL   HDL 88 >16 mg/dL   VLDL Cholesterol Cal 14 5 - 40 mg/dL   LDL Chol Calc (NIH) 96 0 - 99 mg/dL   Chol/HDL Ratio 2.3 0.0 - 4.4 ratio  TSH   Collection Time: 12/15/22 10:19 AM  Result Value Ref Range   TSH 2.290 0.450 - 4.500 uIU/mL  VITAMIN D 25 Hydroxy (Vit-D Deficiency, Fractures)   Collection Time: 12/15/22 10:19 AM  Result Value Ref Range   Vit D, 25-Hydroxy 64.1 30.0 - 100.0 ng/mL  Urinalysis   Collection Time: 12/15/22 10:20 AM  Result Value Ref Range   Specific Gravity, UA 1.010 1.005 - 1.030   pH, UA 5.5 5.0 - 7.5   Color, UA Yellow Yellow   Appearance Ur Clear Clear   Leukocytes,UA Negative Negative   Protein,UA Negative Negative/Trace   Glucose, UA Negative Negative   Ketones, UA Negative Negative   RBC, UA Negative Negative   Bilirubin, UA Negative Negative   Urobilinogen, Ur 0.2 0.2 - 1.0 mg/dL   Nitrite, UA Negative Negative        12/15/2022    9:06 AM 10/03/2022    1:20 PM 08/12/2022    9:23 AM 07/03/2022    9:30 AM 05/13/2022    8:56 AM  Depression screen PHQ 2/9  Decreased Interest 0 0 0 0 0  Down, Depressed, Hopeless 0 0 0 0 0  PHQ - 2 Score 0 0 0 0 0  Altered sleeping  0     Tired, decreased energy  2     Change in appetite  0     Feeling bad or failure about yourself   0     Trouble concentrating  0     Moving slowly or fidgety/restless  0     Suicidal thoughts  0     PHQ-9 Score  2     Difficult doing work/chores  Not difficult at all          10/03/2022    1:20 PM 02/10/2022    8:22 AM 01/14/2022    1:49 PM 11/07/2021    1:16 PM  GAD 7 : Generalized Anxiety Score  Nervous, Anxious, on Edge 0 0 0 0  Control/stop worrying 0 0 0 0  Worry too much - different things 0 0 0 0  Trouble relaxing 0 0 0 0  Restless 0 0 0 0  Easily annoyed or irritable 0 0 0 0  Afraid - awful might happen 0 0 0 0  Total GAD 7 Score 0 0 0 0  Anxiety Difficulty Not difficult at all  Not difficult at all Not difficult at all   Pertinent labs & imaging results that were available during my care of the patient were reviewed by me and considered in my medical decision making.  Assessment & Plan:  Kilyn was seen today for cough, sore throat and nasal congestion.  Diagnoses and all orders for this visit:  Acute cough Will start medications as below given symptoms and patient history of TBM. Provided burst of steroids as below to assist with symptom management. Recommend that patient follow up with PCP or pulmonology if symptoms continue. Discussed with patient red flag symptoms.  -     doxycycline (VIBRA-TABS) 100 MG tablet; Take 1 tablet (100 mg total) by mouth 2 (two) times daily for 7 days. -     predniSONE (DELTASONE) 20 MG tablet; Take 2 tablets (40 mg total) by mouth daily with breakfast for 5 days. -     promethazine-dextromethorphan (PROMETHAZINE-DM) 6.25-15 MG/5ML syrup; Take 5 mLs by mouth 4 (four) times  daily as needed for cough. Max daily 30 ml in 24 hours  Respiratory crackles at left lung base As per HPI  -     doxycycline (VIBRA-TABS) 100 MG tablet; Take 1 tablet (100 mg total) by mouth 2 (two) times daily for 7 days. -     predniSONE (DELTASONE) 20 MG tablet; Take 2 tablets (40 mg total) by mouth daily with breakfast for 5 days. -     promethazine-dextromethorphan (PROMETHAZINE-DM) 6.25-15 MG/5ML syrup; Take 5 mLs by mouth 4 (four) times daily as needed for cough. Max daily 30 ml in 24 hours  Bronchiectasis without complication (HCC) As per HPI  -     doxycycline (VIBRA-TABS) 100 MG tablet; Take 1 tablet (100 mg total) by mouth 2 (two) times daily for 7 days. -     predniSONE (DELTASONE) 20 MG tablet; Take 2 tablets (40 mg total) by mouth daily with breakfast for 5 days. -     promethazine-dextromethorphan (PROMETHAZINE-DM) 6.25-15 MG/5ML syrup; Take 5 mLs by mouth 4 (four) times daily as needed for cough. Max daily 30 ml in 24 hours    Continue all other maintenance medications.  Follow up plan: Return if symptoms worsen or fail to improve.  Continue healthy lifestyle choices, including diet (rich in fruits, vegetables, and lean proteins, and low in salt and simple carbohydrates) and exercise (at least 30 minutes of moderate physical activity daily).  Written and verbal instructions provided   The above assessment and management plan was discussed with the patient. The patient verbalized understanding of and has agreed to the management plan. Patient is aware to call the clinic if they develop any new symptoms or if symptoms persist or worsen. Patient is aware when to return to the clinic for a follow-up visit. Patient educated on when it is appropriate to go to the emergency department.   Neale Burly, DNP-FNP [facility] [address] [place], [place] [address] [phone]

## 2023-01-09 NOTE — Telephone Encounter (Signed)
Copied from CRM [id number]. Topic: Clinical - Red Word Triage >> Jan 09, 2023  8:45 AM Ivette P wrote: Red Word that prompted transfer to Nurse Triage: CHEST PAIN  Chief Complaint:  Severe none productive cough,  Asthma and TBM- Coughing is making her Chest pain Symptoms: Sever Cough Frequency:   Coughing  has been going on 3 weeks Pertinent Negatives: Patient denies cardiac  chest pain. Disposition: [] ED /[] Urgent Care (no appt availability in office) / [x] Appointment(In office/virtual)/ []  [place] [facility]/ [] Home Care/ [] Refused Recommended Disposition /[] [place] [facility]/ []  Follow-up with PCP Additional Notes: Add Patien Reason for Disposition  [1] MILD difficulty breathing (e.g., minimal/no SOB at rest, SOB with walking, pulse <100) AND [2] still present when not coughing  Answer Assessment - Initial Assessment Questions 1. ONSET: "When did the cough begin?"       Cough started 3 weeks ago Diagnoses TBM 2. SEVERITY: "How bad is the cough today?"       Very barky   3. SPUTUM: "Describe the color of your sputum" (none, dry cough; clear, white, yellow, green)     NONE 4. HEMOPTYSIS: "Are you coughing up any blood?" If so ask: "How much?" (flecks, streaks, tablespoons, etc.)     Denies 5. DIFFICULTY BREATHING: "Are you having difficulty breathing?" If Yes, ask: "How bad is it?" (e.g., mild, moderate, severe)    -   - MODERATE: SOB at rest, SOB with minimal exertion and prefers to sit, cannot lie down flat, speaks in phrases, mild retractions, audible wheezing, pulse 100-120.    -6. FEVER: "Do you have a fever?" If Yes, ask: "What is your temperature, how was it measured, and when did it start?"     Denies 7. CARDIAC HISTORY: "Do you have any history of heart disease?" (e.g., heart attack, congestive heart failure)       8. LUNG HISTORY: "Do you have any history of lung disease?"  (e.g., pulmonary embolus, asthma, emphysema)      TBM , Brionchiates and Sever Asthma 9. PE  RISK FACTORS: "Do you have a history of blood clots?" (or: recent major surgery, recent prolonged travel, bedridden)     Yes, Last January  2024  10. OTHER SYMPTOMS: "Do you have any other symptoms?" (e.g., runny nose, wheezing, chest pain)        Mucosus  Protocols used: Cough - Acute Non-Productive-A-AH

## 2023-02-17 ENCOUNTER — Ambulatory Visit: Payer: Self-pay | Admitting: Family Medicine

## 2023-02-17 NOTE — Telephone Encounter (Signed)
Chief Complaint: Leg pain  Symptoms: Right leg pain 4/10, burning, tingling  Frequency: comes and goes  Pertinent Negatives: Patient denies redness, swelling, warm to touch  Disposition: [] ED /[] Urgent Care (no appt availability in office) / [x] Appointment(In office/virtual)/ []  [place] [facility]/ [] Home Care/ [] Refused Recommended Disposition /[] [place] [facility]/ []  Follow-up with PCP Additional Notes: Patient states she has had right leg pain that includes a burning and tingling feeling that goes up the leg to the calf and around to the front of the leg. Patient states she has tried OTC pain medication without much improvement to her symptoms. Patient states the only thing she can think of that maybe causing the pain is she has been move active going up to the hospital almost daily to help care for her mother. Walking to and from the car and around the hospital is more walking than she usually does. Care advice was given and patient was offered an appointment today for evaluation but patient can not make it in today. Patient has been scheduled for tomorrow at 0800.   Reason for Disposition  [1] MODERATE pain (e.g., interferes with normal activities, limping) AND [2] present > 3 days  Answer Assessment - Initial Assessment Questions 1. ONSET: "When did the pain start?"      A few weeks ago  2. LOCATION: "Where is the pain located?"      Right leg  3. PAIN: "How bad is the pain?"    (Scale 1-10; or mild, moderate, severe)   -  MILD (1-3): doesn't interfere with normal activities    -  MODERATE (4-7): interferes with normal activities (e.g., work or school) or awakens from sleep, limping    -  SEVERE (8-10): excruciating pain, unable to do any normal activities, unable to walk     3/10 4. WORK OR EXERCISE: "Has there been any recent work or exercise that involved this part of the body?"      I am a little more active helping take care of my mom  5. CAUSE: "What do you think is  causing the leg pain?"     I don't know  6. OTHER SYMPTOMS: "Do you have any other symptoms?" (e.g., chest pain, back pain, breathing difficulty, swelling, rash, fever, numbness, weakness)     Tingling in the leg going up to calf and front of the leg, left leg pain  Protocols used: Leg Pain-A-AH

## 2023-02-18 ENCOUNTER — Encounter: Payer: Self-pay | Admitting: Nurse Practitioner

## 2023-02-18 ENCOUNTER — Ambulatory Visit (INDEPENDENT_AMBULATORY_CARE_PROVIDER_SITE_OTHER): Payer: Medicare Other

## 2023-02-18 ENCOUNTER — Ambulatory Visit: Payer: Medicare Other | Admitting: Nurse Practitioner

## 2023-02-18 VITALS — BP 98/60 | HR 81 | Temp 97.7°F | Ht 60.75 in | Wt 165.8 lb

## 2023-02-18 DIAGNOSIS — M25571 Pain in right ankle and joints of right foot: Secondary | ICD-10-CM | POA: Diagnosis not present

## 2023-02-18 DIAGNOSIS — M79604 Pain in right leg: Secondary | ICD-10-CM | POA: Diagnosis not present

## 2023-02-18 NOTE — Progress Notes (Signed)
Acute Office Visit  Subjective:     Patient ID: Amy Gray, female    DOB: 1960-12-17, 63 y.o.   MRN: [id number]  Chief Complaint  Patient presents with   Leg Pain    Leg pain mainly right leg, starts at ankle goes up for few weeks getting worse    HPI Amy Gray is a 63 yrs old female present 02/18/2023 for an acute concerns for  63 year old female presents with right leg pain for a few weeks. Describes it as a cramping sensation, gradually worsening. The pain is now stinging and rated as 3/10. She mentions that the pain feels like an old sprain that keeps getting worse. Patient has not been using any over-the-counter medications for relief.The patient has a previous diagnosis of fibromyalgia, but she disagrees with that diagnosis, stating it was made years ago when she had back spasms. She is concerned that it may not be accurate. She reports increased activity recently due to visiting her mother, who is currently hospitalized. This has involved walking around the hospital, which is unusual for her since she is retired and typically stays at home. She also just started Levaquin 750 mg daily (likely prescribed for a current infection) Prednisone (dose pack, recently prescribed).  Active Ambulatory Problems    Diagnosis Date Noted   Airway hyperreactivity 03/01/2014   Mixed hyperlipidemia 03/01/2014   Generalized anxiety disorder 10/03/2014   Restless leg syndrome, uncontrolled 10/03/2014   Fibromyalgia syndrome 10/24/2015   Chronic migraine without aura without status migrainosus, not intractable 10/24/2015   IIH (idiopathic intracranial hypertension) 11/24/2016   Dizziness 11/24/2016   Arthralgia 07/15/2017   Normal pressure hydrocephalus (HCC) 10/16/2017   Non-seasonal allergic rhinitis 03/30/2019   Vitamin B12 deficiency 03/30/2019   Insomnia due to other mental disorder 03/30/2019   Vitamin D deficiency 03/30/2019   Hot flashes due to menopause 03/30/2019   Acute  pulmonary embolism (HCC) 02/10/2022   Abdominal aortic atherosclerosis (HCC) 05/13/2022   Gastroesophageal reflux disease with esophagitis without hemorrhage 05/13/2022   Bronchiectasis without complication (HCC) 12/15/2022   Anterior leg pain, right 02/18/2023   Resolved Ambulatory Problems    Diagnosis Date Noted   Cervical spinal cord compression (HCC) 10/10/2013   Awareness of heartbeats 03/01/2014   Wellness examination 12/24/2014   Hypotension due to drugs 11/24/2016   Past Medical History:  Diagnosis Date   Anxiety    Asthma    COPD (chronic obstructive pulmonary disease) (HCC)    Depression    Emphysema of lung (HCC)    GERD (gastroesophageal reflux disease)    Hyperlipidemia    Migraines    Osteoarthritis    Sleep apnea      Review of Systems  Constitutional:  Negative for chills and fever.  Respiratory:  Negative for cough, shortness of breath and wheezing.   Cardiovascular:  Negative for chest pain, palpitations and leg swelling.  Gastrointestinal:  Negative for constipation, diarrhea, nausea and vomiting.  Musculoskeletal:  Positive for joint pain.       Right leg  Skin:  Negative for itching and rash.  Neurological:  Negative for dizziness and headaches.  Endo/Heme/Allergies:  Negative for environmental allergies and polydipsia. Does not bruise/bleed easily.   Negative unless indicated in HPI    Objective:    BP 98/60   Pulse 81   Temp 97.7 F (36.5 C) (Temporal)   Ht 5' 0.75" (1.543 m)   Wt 165 lb 12.8 oz (75.2 kg)   SpO2 100%   BMI  31.59 kg/m  BP Readings from Last 3 Encounters:  02/18/23 98/60  01/09/23 107/70  12/15/22 107/64   Wt Readings from Last 3 Encounters:  02/18/23 165 lb 12.8 oz (75.2 kg)  01/09/23 166 lb 6.4 oz (75.5 kg)  12/15/22 166 lb 6.4 oz (75.5 kg)      Physical Exam Vitals and nursing note reviewed.  Constitutional:      Appearance: She is obese.  HENT:     Head: Normocephalic and atraumatic.     Mouth/Throat:      Mouth: Mucous membranes are moist.  Eyes:     General: No scleral icterus.    Extraocular Movements: Extraocular movements intact.     Conjunctiva/sclera: Conjunctivae normal.  Cardiovascular:     Rate and Rhythm: Normal rate and regular rhythm.  Pulmonary:     Effort: Pulmonary effort is normal.     Breath sounds: Normal breath sounds.  Musculoskeletal:     Right lower leg: Tenderness present. No swelling, deformity, lacerations or bony tenderness. No edema.     Left lower leg: No edema.     Right ankle: Normal.     Right Achilles Tendon: Normal. No tenderness. Thompson's test negative.     Left ankle: Normal.     Left Achilles Tendon: Normal. No tenderness. Thompson's test negative.  Skin:    General: Skin is warm and dry.     Findings: No rash.  Neurological:     Mental Status: She is alert and oriented to person, place, and time.  Psychiatric:        Mood and Affect: Mood normal.        Behavior: Behavior normal.        Thought Content: Thought content normal.        Judgment: Judgment normal.    No results found for any visits on 02/18/23.      Assessment & Plan:  Anterior leg pain, right -     DG Ankle Complete Right   Amy Gray is a 63 yrs old Caucasian female seen today for right leg pain, no acute distress Order X-ray to rule out fracture, results pending Rest and avoid overexertion, especially with walking or standing for prolonged periods. Continue taking previously prescribed baclofen and prednisone Continue all prescribed medications  Encourage  healthy lifestyle choices, including diet (rich in fruits, vegetables, and lean proteins, and low in salt and simple carbohydrates) and exercise (at least 30 minutes of moderate physical activity daily).     The above assessment and management plan was discussed with the patient. The patient verbalized understanding of and has agreed to the management plan. Patient is aware to call the clinic if they develop any new  symptoms or if symptoms persist or worsen. Patient is aware when to return to the clinic for a follow-up visit. Patient educated on when it is appropriate to go to the emergency department.  Return if symptoms worsen or fail to improve.  Arrie Aran Santa Lighter, [facility] [address] [place], [place] [address] [phone]  Note: This document was prepared by [employer] voice dictation technology and any errors that results from this process are unintentional.

## 2023-03-17 ENCOUNTER — Encounter: Payer: Self-pay | Admitting: Family Medicine

## 2023-03-17 ENCOUNTER — Ambulatory Visit (INDEPENDENT_AMBULATORY_CARE_PROVIDER_SITE_OTHER): Payer: Medicare Other | Admitting: Family Medicine

## 2023-03-17 DIAGNOSIS — G2581 Restless legs syndrome: Secondary | ICD-10-CM | POA: Diagnosis not present

## 2023-03-17 DIAGNOSIS — E782 Mixed hyperlipidemia: Secondary | ICD-10-CM | POA: Diagnosis not present

## 2023-03-17 DIAGNOSIS — K2100 Gastro-esophageal reflux disease with esophagitis, without bleeding: Secondary | ICD-10-CM | POA: Diagnosis not present

## 2023-03-17 DIAGNOSIS — F411 Generalized anxiety disorder: Secondary | ICD-10-CM

## 2023-03-17 DIAGNOSIS — M797 Fibromyalgia: Secondary | ICD-10-CM

## 2023-03-17 NOTE — Progress Notes (Unsigned)
 Chief Complaint  Patient presents with   Bursitis    Left is worse. Hurts to lay on side.     HPI  Patient presents today for pain at both hips. Points to the left trochanteric bursa as point of maximal pain. 6-8/10. Worse when she lays on her side at night. Ongoing several months. Getting worse.  Needing refills on multiple meds including anxiety, restless leg, GERD scrips  PMH: Smoking status noted ROS: Per HPI  Objective: BP 112/75   Temp 98.9 F (37.2 C)   Ht 5' 0.75" (1.543 m)   Wt 163 lb (73.9 kg)   SpO2 97%   BMI 31.05 kg/m  Gen: NAD, alert, cooperative with exam HEENT: NCAT, EOMI, PERRL CV: RRR, good S1/S2, no murmur Resp: CTABL, no wheezes, non-labored Ext: No edema, warm. Tender over greater trochanter. L>R Neuro: Alert and oriented, No gross deficits  After obtaining consent, injection of 1. Ml celestone and 2.5 marcan introduced into the left trochantic bursa using typical landmarks. Tolerated well. No complications. < 1 ml bleeding.   Assessment and plan:  1. Gastroesophageal reflux disease with esophagitis without hemorrhage   2. Fibromyalgia syndrome   3. Restless leg syndrome, uncontrolled   4. Mixed hyperlipidemia   5. Generalized anxiety disorder     Meds ordered this encounter  Medications   omeprazole (PRILOSEC) 40 MG capsule    Sig: Take 1 capsule (40 mg total) by mouth 2 (two) times daily.    Dispense:  180 capsule    Refill:  3   rOPINIRole (REQUIP) 4 MG tablet    Sig: Take 1 tablet (4 mg total) by mouth at bedtime.    Dispense:  90 tablet    Refill:  3   rosuvastatin (CRESTOR) 10 MG tablet    Sig: Take 1 tablet (10 mg total) by mouth daily.    Dispense:  90 tablet    Refill:  3   sertraline (ZOLOFT) 100 MG tablet    Sig: Take 1 tablet (100 mg total) by mouth at bedtime.    Dispense:  90 tablet    Refill:  0      Follow up as needed.  Mechele Claude, MD

## 2023-03-20 MED ORDER — ROSUVASTATIN CALCIUM 10 MG PO TABS: 10.0000 mg | ORAL_TABLET | Freq: Every day | ORAL | 3 refills | Status: DC

## 2023-03-20 MED ORDER — SERTRALINE HCL 100 MG PO TABS: 100.0000 mg | ORAL_TABLET | Freq: Every day | ORAL | 0 refills | Status: DC

## 2023-03-20 MED ORDER — OMEPRAZOLE 40 MG PO CPDR: 40.0000 mg | DELAYED_RELEASE_CAPSULE | Freq: Two times a day (BID) | ORAL | 3 refills | Status: DC

## 2023-03-20 MED ORDER — ROPINIROLE HCL 4 MG PO TABS: ORAL_TABLET | ORAL | 3 refills | Status: AC

## 2023-03-24 DIAGNOSIS — G43719 Chronic migraine without aura, intractable, without status migrainosus: Secondary | ICD-10-CM | POA: Diagnosis not present

## 2023-03-26 DIAGNOSIS — G4733 Obstructive sleep apnea (adult) (pediatric): Secondary | ICD-10-CM | POA: Diagnosis not present

## 2023-04-02 DIAGNOSIS — M51362 Other intervertebral disc degeneration, lumbar region with discogenic back pain and lower extremity pain: Secondary | ICD-10-CM | POA: Diagnosis not present

## 2023-04-02 DIAGNOSIS — M5416 Radiculopathy, lumbar region: Secondary | ICD-10-CM | POA: Diagnosis not present

## 2023-04-15 DIAGNOSIS — M5416 Radiculopathy, lumbar region: Secondary | ICD-10-CM | POA: Diagnosis not present

## 2023-04-15 DIAGNOSIS — M542 Cervicalgia: Secondary | ICD-10-CM | POA: Diagnosis not present

## 2023-04-15 DIAGNOSIS — F1120 Opioid dependence, uncomplicated: Secondary | ICD-10-CM | POA: Diagnosis not present

## 2023-04-15 DIAGNOSIS — M51362 Other intervertebral disc degeneration, lumbar region with discogenic back pain and lower extremity pain: Secondary | ICD-10-CM | POA: Diagnosis not present

## 2023-04-15 DIAGNOSIS — M5412 Radiculopathy, cervical region: Secondary | ICD-10-CM | POA: Diagnosis not present

## 2023-04-17 IMAGING — DX DG LUMBAR SPINE 2-3V
2 series · 2 of 2 positions shown · non-contrast
Comparison: None.

CLINICAL DATA: Back pain

EXAM:
LUMBAR SPINE - 2-3 VIEW

[l-spine ap]
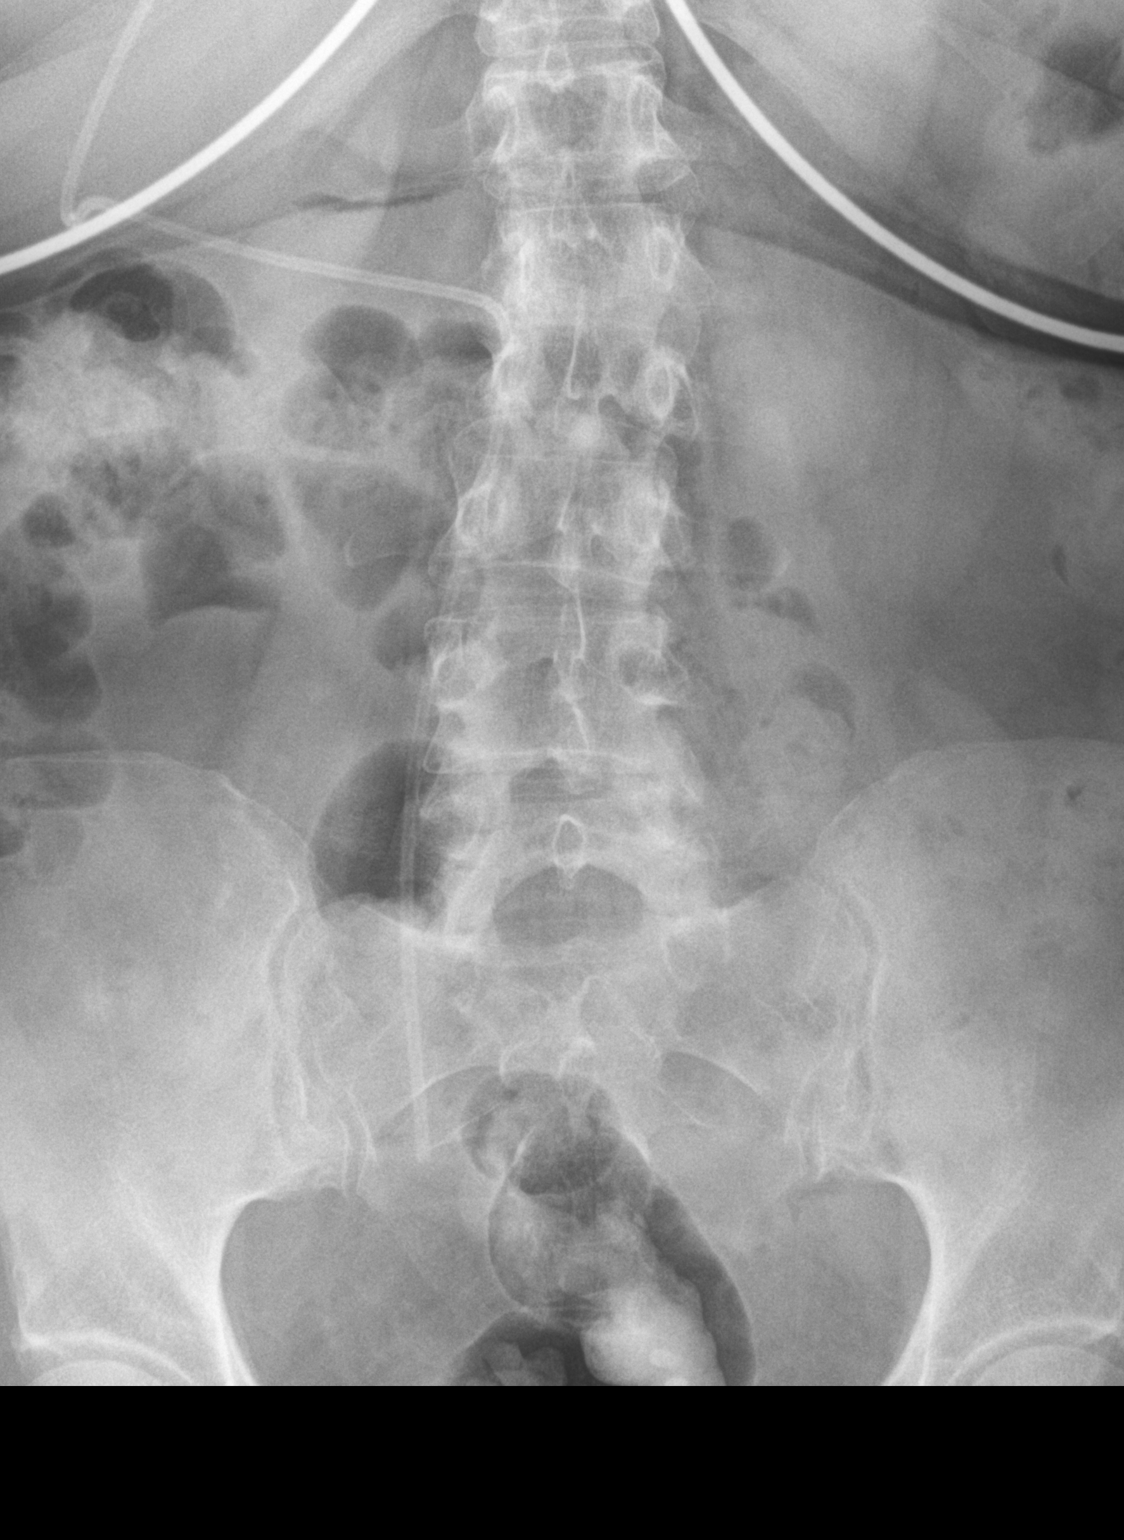

[l-spine lat]
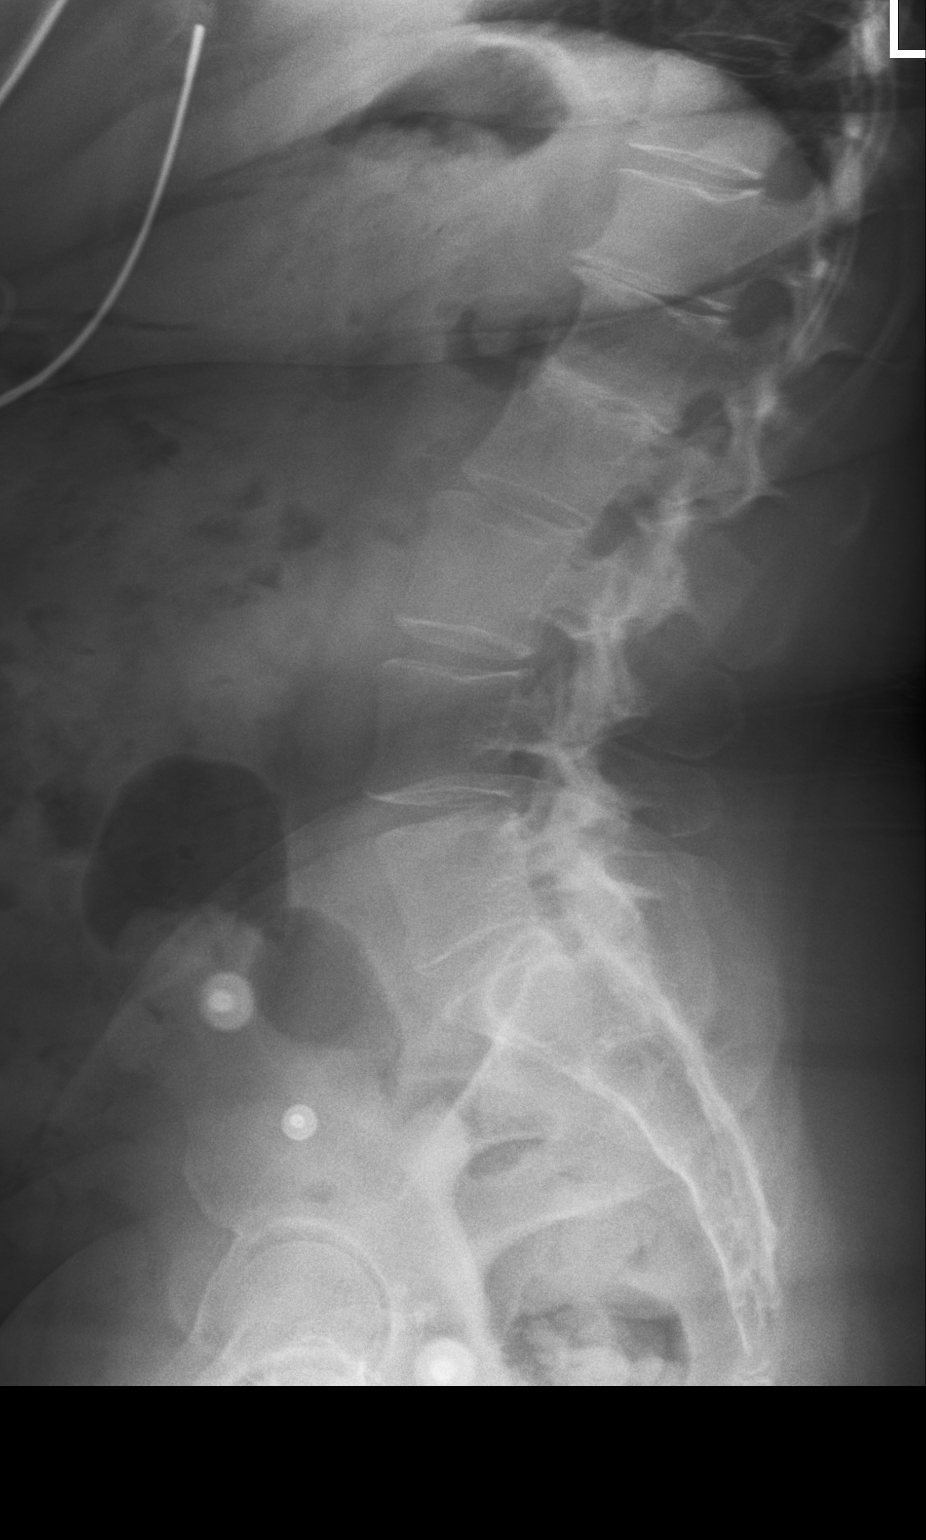

[2 of 2 positions shown; findings below may reference images not displayed]

FINDINGS: Mild S shaped scoliosis of the lumbar spine. Lumbar vertebral body
height are preserved without evidence of fracture. Minimal grade 1
retrolisthesis of L1 on L2. Severe intervertebral disc space
narrowing at L1-L2 with associated endplate sclerosis. Mild facet
arthropathy in the lower lumbar spine. Partially visualized
ventriculoperitoneal shunt catheter noted.
IMPRESSION: Degenerative changes of the lumbar spine with no acute osseous
abnormality identified.

## 2023-04-23 DIAGNOSIS — M48061 Spinal stenosis, lumbar region without neurogenic claudication: Secondary | ICD-10-CM | POA: Diagnosis not present

## 2023-04-23 DIAGNOSIS — Z9689 Presence of other specified functional implants: Secondary | ICD-10-CM | POA: Diagnosis not present

## 2023-04-23 DIAGNOSIS — G932 Benign intracranial hypertension: Secondary | ICD-10-CM | POA: Diagnosis not present

## 2023-04-23 DIAGNOSIS — M5416 Radiculopathy, lumbar region: Secondary | ICD-10-CM | POA: Diagnosis not present

## 2023-04-23 DIAGNOSIS — M4316 Spondylolisthesis, lumbar region: Secondary | ICD-10-CM | POA: Diagnosis not present

## 2023-04-23 DIAGNOSIS — M4726 Other spondylosis with radiculopathy, lumbar region: Secondary | ICD-10-CM | POA: Diagnosis not present

## 2023-04-23 DIAGNOSIS — M5116 Intervertebral disc disorders with radiculopathy, lumbar region: Secondary | ICD-10-CM | POA: Diagnosis not present

## 2023-04-28 DIAGNOSIS — J8283 Eosinophilic asthma: Secondary | ICD-10-CM | POA: Diagnosis not present

## 2023-04-28 DIAGNOSIS — J4550 Severe persistent asthma, uncomplicated: Secondary | ICD-10-CM | POA: Diagnosis not present

## 2023-04-28 DIAGNOSIS — J310 Chronic rhinitis: Secondary | ICD-10-CM | POA: Diagnosis not present

## 2023-04-28 DIAGNOSIS — J479 Bronchiectasis, uncomplicated: Secondary | ICD-10-CM | POA: Diagnosis not present

## 2023-04-28 DIAGNOSIS — J398 Other specified diseases of upper respiratory tract: Secondary | ICD-10-CM | POA: Diagnosis not present

## 2023-05-13 ENCOUNTER — Other Ambulatory Visit: Payer: Self-pay | Admitting: *Deleted

## 2023-05-13 DIAGNOSIS — J479 Bronchiectasis, uncomplicated: Secondary | ICD-10-CM | POA: Diagnosis not present

## 2023-05-13 MED ORDER — BREZTRI AEROSPHERE 160-9-4.8 MCG/ACT IN AERO: 2.0000 | INHALATION_SPRAY | Freq: Two times a day (BID) | RESPIRATORY_TRACT | 11 refills | Status: AC

## 2023-05-26 ENCOUNTER — Other Ambulatory Visit: Payer: Self-pay | Admitting: Family Medicine

## 2023-05-26 DIAGNOSIS — E538 Deficiency of other specified B group vitamins: Secondary | ICD-10-CM

## 2023-05-29 DIAGNOSIS — Z9851 Tubal ligation status: Secondary | ICD-10-CM | POA: Diagnosis not present

## 2023-05-29 DIAGNOSIS — F419 Anxiety disorder, unspecified: Secondary | ICD-10-CM | POA: Diagnosis not present

## 2023-05-29 DIAGNOSIS — G4730 Sleep apnea, unspecified: Secondary | ICD-10-CM | POA: Diagnosis not present

## 2023-05-29 DIAGNOSIS — J45909 Unspecified asthma, uncomplicated: Secondary | ICD-10-CM | POA: Diagnosis not present

## 2023-05-29 DIAGNOSIS — Z86711 Personal history of pulmonary embolism: Secondary | ICD-10-CM | POA: Diagnosis not present

## 2023-05-29 DIAGNOSIS — M5416 Radiculopathy, lumbar region: Secondary | ICD-10-CM | POA: Diagnosis not present

## 2023-05-29 DIAGNOSIS — E559 Vitamin D deficiency, unspecified: Secondary | ICD-10-CM | POA: Diagnosis not present

## 2023-05-29 DIAGNOSIS — Z79899 Other long term (current) drug therapy: Secondary | ICD-10-CM | POA: Diagnosis not present

## 2023-05-29 DIAGNOSIS — K219 Gastro-esophageal reflux disease without esophagitis: Secondary | ICD-10-CM | POA: Diagnosis not present

## 2023-05-29 DIAGNOSIS — M1990 Unspecified osteoarthritis, unspecified site: Secondary | ICD-10-CM | POA: Diagnosis not present

## 2023-05-29 DIAGNOSIS — E7800 Pure hypercholesterolemia, unspecified: Secondary | ICD-10-CM | POA: Diagnosis not present

## 2023-05-29 DIAGNOSIS — G43809 Other migraine, not intractable, without status migrainosus: Secondary | ICD-10-CM | POA: Diagnosis not present

## 2023-05-29 DIAGNOSIS — Z982 Presence of cerebrospinal fluid drainage device: Secondary | ICD-10-CM | POA: Diagnosis not present

## 2023-05-29 DIAGNOSIS — Z8719 Personal history of other diseases of the digestive system: Secondary | ICD-10-CM | POA: Diagnosis not present

## 2023-05-29 DIAGNOSIS — Z7952 Long term (current) use of systemic steroids: Secondary | ICD-10-CM | POA: Diagnosis not present

## 2023-05-29 DIAGNOSIS — Z981 Arthrodesis status: Secondary | ICD-10-CM | POA: Diagnosis not present

## 2023-06-02 ENCOUNTER — Other Ambulatory Visit: Payer: Self-pay | Admitting: Family Medicine

## 2023-06-02 DIAGNOSIS — K080 Exfoliation of teeth due to systemic causes: Secondary | ICD-10-CM | POA: Diagnosis not present

## 2023-06-02 DIAGNOSIS — J45909 Unspecified asthma, uncomplicated: Secondary | ICD-10-CM

## 2023-06-12 DIAGNOSIS — J479 Bronchiectasis, uncomplicated: Secondary | ICD-10-CM | POA: Diagnosis not present

## 2023-06-13 ENCOUNTER — Other Ambulatory Visit: Payer: Self-pay | Admitting: Family Medicine

## 2023-06-13 DIAGNOSIS — E782 Mixed hyperlipidemia: Secondary | ICD-10-CM

## 2023-06-16 ENCOUNTER — Other Ambulatory Visit: Payer: Self-pay | Admitting: Family Medicine

## 2023-06-16 DIAGNOSIS — F5105 Insomnia due to other mental disorder: Secondary | ICD-10-CM

## 2023-06-16 DIAGNOSIS — M797 Fibromyalgia: Secondary | ICD-10-CM

## 2023-06-16 DIAGNOSIS — F411 Generalized anxiety disorder: Secondary | ICD-10-CM

## 2023-06-24 DIAGNOSIS — G4733 Obstructive sleep apnea (adult) (pediatric): Secondary | ICD-10-CM | POA: Diagnosis not present

## 2023-06-25 DIAGNOSIS — G43719 Chronic migraine without aura, intractable, without status migrainosus: Secondary | ICD-10-CM | POA: Diagnosis not present

## 2023-07-06 DIAGNOSIS — G4733 Obstructive sleep apnea (adult) (pediatric): Secondary | ICD-10-CM | POA: Diagnosis not present

## 2023-07-06 DIAGNOSIS — Z9989 Dependence on other enabling machines and devices: Secondary | ICD-10-CM | POA: Diagnosis not present

## 2023-07-06 DIAGNOSIS — J4550 Severe persistent asthma, uncomplicated: Secondary | ICD-10-CM | POA: Diagnosis not present

## 2023-07-06 DIAGNOSIS — J471 Bronchiectasis with (acute) exacerbation: Secondary | ICD-10-CM | POA: Diagnosis not present

## 2023-07-13 DIAGNOSIS — H52223 Regular astigmatism, bilateral: Secondary | ICD-10-CM | POA: Diagnosis not present

## 2023-07-14 ENCOUNTER — Encounter: Payer: Self-pay | Admitting: Family Medicine

## 2023-07-22 ENCOUNTER — Ambulatory Visit: Payer: Self-pay

## 2023-07-22 NOTE — Telephone Encounter (Signed)
 Copied from CRM [id number]. Topic: Clinical - Red Word Triage >> Jul 22, 2023  9:46 AM Nathanel BROCKS wrote: Red Word that prompted transfer to Nurse Triage:   Pt has cough, congestion and tightness in chest.   ----------------------------------------------------------------------- From previous Reason for Contact - Scheduling: Patient/patient representative is calling to schedule an appointment. Refer to attachments for appointment information.    FYI Only or Action Required?: FYI only for provider.  Patient was last seen in primary care on 03/17/2023 by Zollie Lowers, MD. Called Nurse Triage reporting Cough. Symptoms began several days ago. Symptoms are: gradually worsening.  Triage Disposition: Go to ED Now (Notify PCP)  Patient/caregiver understands and will follow disposition?: Yes    Reason for Disposition  Chest pain  (Exception: MILD central chest pain, present only when coughing.)  Answer Assessment - Initial Assessment Questions 1. ONSET: When did the cough begin?      Several days ago 2. SEVERITY: How bad is the cough today?      Moderate  3. SPUTUM: Describe the color of your sputum (none, dry cough; clear, white, yellow, green)     No 4. HEMOPTYSIS: Are you coughing up any blood? If so ask: How much? (flecks, streaks, tablespoons, etc.)     No 5. DIFFICULTY BREATHING: Are you having difficulty breathing? If Yes, ask: How bad is it? (e.g., mild, moderate, severe)    - MILD: No SOB at rest, mild SOB with walking, speaks normally in sentences, can lie down, no retractions, pulse < 100.    - MODERATE: SOB at rest, SOB with minimal exertion and prefers to sit, cannot lie down flat, speaks in phrases, mild retractions, audible wheezing, pulse 100-120.    - SEVERE: Very SOB at rest, speaks in single words, struggling to breathe, sitting hunched forward, retractions, pulse > 120      No 6. FEVER: Do you have a fever? If Yes, ask: What is your temperature, how was it  measured, and when did it start?     No 7. CARDIAC HISTORY: Do you have any history of heart disease? (e.g., heart attack, congestive heart failure)      Yes 8. LUNG HISTORY: Do you have any history of lung disease?  (e.g., pulmonary embolus, asthma, emphysema)     Yes 9. PE RISK FACTORS: Do you have a history of blood clots? (or: recent major surgery, recent prolonged travel, bedridden)     Yes 10. OTHER SYMPTOMS: Do you have any other symptoms? (e.g., runny nose, wheezing, chest pain)       Chest tightness, congestion, nausea  Protocols used: Cough - Acute Non-Productive-A-AH

## 2023-08-06 DIAGNOSIS — M5412 Radiculopathy, cervical region: Secondary | ICD-10-CM | POA: Diagnosis not present

## 2023-08-06 DIAGNOSIS — M5416 Radiculopathy, lumbar region: Secondary | ICD-10-CM | POA: Diagnosis not present

## 2023-08-06 DIAGNOSIS — M542 Cervicalgia: Secondary | ICD-10-CM | POA: Diagnosis not present

## 2023-08-06 DIAGNOSIS — F1120 Opioid dependence, uncomplicated: Secondary | ICD-10-CM | POA: Diagnosis not present

## 2023-08-12 DIAGNOSIS — J479 Bronchiectasis, uncomplicated: Secondary | ICD-10-CM | POA: Diagnosis not present

## 2023-08-13 ENCOUNTER — Ambulatory Visit (INDEPENDENT_AMBULATORY_CARE_PROVIDER_SITE_OTHER): Payer: Medicare Other

## 2023-08-13 VITALS — BP 112/75 | Ht 60.0 in | Wt 163.0 lb

## 2023-08-13 DIAGNOSIS — Z1231 Encounter for screening mammogram for malignant neoplasm of breast: Secondary | ICD-10-CM

## 2023-08-13 DIAGNOSIS — Z0000 Encounter for general adult medical examination without abnormal findings: Secondary | ICD-10-CM

## 2023-08-13 NOTE — Progress Notes (Signed)
 Subjective:   Amy Gray is a 63 y.o. who presents for a Medicare Wellness preventive visit.  As a reminder, Annual Wellness Visits don't include a physical exam, and some assessments may be limited, especially if this visit is performed virtually. We may recommend an in-person follow-up visit with your provider if needed.  Visit Complete: Virtual I connected with  Amy Gray on 08/13/23 by a audio enabled telemedicine application and verified that I am speaking with the correct person using two identifiers.  Patient Location: Home  Provider Location: Home Office  I discussed the limitations of evaluation and management by telemedicine. The patient expressed understanding and agreed to proceed.  Vital Signs: Because this visit was a virtual/telehealth visit, some criteria may be missing or patient reported. Any vitals not documented were not able to be obtained and vitals that have been documented are patient reported.  VideoDeclined- This patient declined Librarian, academic. Therefore the visit was completed with audio only.  Persons Participating in Visit: Patient.  AWV Questionnaire: Yes: Patient Medicare AWV questionnaire was completed by the patient on 08/10/23; I have confirmed that all information answered by patient is correct and no changes since this date.  Cardiac Risk Factors include: advanced age (>59men, >33 women);dyslipidemia;obesity (BMI >30kg/m2)     Objective:    Today's Vitals   08/13/23 0923 08/13/23 0925  BP: 112/75   Weight: 163 lb (73.9 kg)   Height: 5' (1.524 m)   PainSc:  3    Body mass index is 31.83 kg/m.     08/13/2023    9:28 AM 08/12/2022    9:24 AM 07/31/2021    9:09 AM 07/25/2020    2:59 PM  Advanced Directives  Does Patient Have a Medical Advance Directive? No No No No  Would patient like information on creating a medical advance directive?  Yes (MAU/Ambulatory/Procedural Areas - Information given) No  - Patient declined No - Patient declined    Current Medications (verified) Outpatient Encounter Medications as of 08/13/2023  Medication Sig   acetaZOLAMIDE (DIAMOX) 250 MG tablet Take 250 mg by mouth daily.   albuterol  (PROVENTIL ) (2.5 MG/3ML) 0.083% nebulizer solution Take 3 mLs(1 VIAL) (2.5 mg total) by nebulization every 4 (four) hours as needed for wheezing or shortness of breath.   albuterol  (VENTOLIN  HFA) 108 (90 Base) MCG/ACT inhaler Inhale 2 puffs into the lungs every 6 (six) hours as needed for wheezing or shortness of breath.   azithromycin  (ZITHROMAX ) 500 MG tablet Take 1 tablet (500 mg total) by mouth 3 (three) times a week.   baclofen (LIORESAL) 10 MG tablet Take 10 mg by mouth 3 (three) times daily.   BOTOX 200 units SOLR    budeson-glycopyrrolate-formoterol  (BREZTRI  AEROSPHERE) 160-9-4.8 MCG/ACT AERO inhaler Inhale 2 puffs into the lungs in the morning and at bedtime.   budesonide  (PULMICORT ) 0.25 MG/2ML nebulizer solution Take 2 mLs (0.25 mg total) by nebulization 2 (two) times daily as needed (shortness of breath).   Cholecalciferol (VITAMIN D ) 2000 UNITS CAPS Take 5,000 Units by mouth daily.    cyanocobalamin  (VITAMIN B12) 1000 MCG/ML injection Inject 1 mL (1,000 mcg total) into the skin every 30 (thirty) days.   famotidine  (PEPCID ) 40 MG tablet Take 1 tablet (40 mg total) by mouth daily.   montelukast  (SINGULAIR ) 10 MG tablet Take 1 tablet (10 mg total) by mouth at bedtime.   omeprazole  (PRILOSEC) 40 MG capsule Take 1 capsule (40 mg total) by mouth 2 (two) times daily.  polyethylene glycol powder (GLYCOLAX /MIRALAX ) 17 GM/SCOOP powder Take 17 g by mouth daily.   prochlorperazine  (COMPAZINE ) 10 MG tablet Take 0.5 tablets (5 mg total) by mouth every 8 (eight) hours as needed for nausea or vomiting.   promethazine -dextromethorphan (PROMETHAZINE -DM) 6.25-15 MG/5ML syrup Take 5 mLs by mouth 4 (four) times daily as needed for cough. Max daily 30 ml in 24 hours   rOPINIRole  (REQUIP )  4 MG tablet Take 1 tablet (4 mg total) by mouth at bedtime.   rosuvastatin  (CRESTOR ) 10 MG tablet Take 1 tablet (10 mg total) by mouth daily.   sertraline  (ZOLOFT ) 100 MG tablet TAKE ONE TABLET BY MOUTH AT BEDTIME   tezepelumab -ekko (TEZSPIRE ) 210 MG/1. syringe    traZODone  (DESYREL ) 150 MG tablet TAKE 1/3 (ONE-THIRD) to 1 (ONE) tablet BY MOUTH nightly as needed for sleep.   Ubrogepant (UBRELVY PO) Take by mouth.   zonisamide (ZONEGRAN) 100 MG capsule Take 200 mg by mouth at bedtime.   No facility-administered encounter medications on file as of 08/13/2023.    Allergies (verified) Pregabalin, Chlorzoxazone, Diclofenac, Metoclopramide, Sibutramine, Topiramate , Venlafaxine, Amitriptyline, Duloxetine hcl, Guaifenesin er, Oxybutynin, Gabapentin, and Propranolol   History: Past Medical History:  Diagnosis Date   Anxiety    Asthma    COPD (chronic obstructive pulmonary disease) (HCC)    Depression    Emphysema of lung (HCC)    GERD (gastroesophageal reflux disease)    Hyperlipidemia    Migraines    Osteoarthritis    Sleep apnea    Very mild,use for moderate airway collapse   Past Surgical History:  Procedure Laterality Date   ABDOMINAL HYSTERECTOMY     BRAIN SURGERY  05/2016   CERVICAL FUSION  01/20/2013   C4 - C7   RHINOPLASTY     SPINE SURGERY  2015   TUBAL LIGATION  1986   Family History  Problem Relation Age of Onset   Arthritis Mother    Asthma Mother    Heart disease Mother    Arthritis Father    Heart disease Father    Heart attack Father    Hypertension Sister    Social History   Socioeconomic History   Marital status: Married    Spouse name: Not on file   Number of children: 2   Years of education: Not on file   Highest education level: 12th grade  Occupational History   Occupation: Disabled  Tobacco Use   Smoking status: Never   Smokeless tobacco: Never  Substance and Sexual Activity   Alcohol use: No   Drug use: No   Sexual activity: Not on  file  Other Topics Concern   Not on file  Social History Narrative   Not on file   Social Drivers of Health   Financial Resource Strain: Low Risk  (08/13/2023)   Overall Financial Resource Strain (CARDIA)    Difficulty of Paying Living Expenses: Not hard at all  Food Insecurity: No Food Insecurity (08/13/2023)   Hunger Vital Sign    Worried About Running Out of Food in the Last Year: Never true    Ran Out of Food in the Last Year: Never true  Transportation Needs: No Transportation Needs (08/13/2023)   PRAPARE - Administrator, Civil Service (Medical): No    Lack of Transportation (Non-Medical): No  Physical Activity: Inactive (08/13/2023)   Exercise Vital Sign    Days of Exercise per Week: 0 days    Minutes of Exercise per Session: 0 min  Stress: No  Stress Concern Present (08/13/2023)   [employer] of Occupational Health - Occupational Stress Questionnaire    Feeling of Stress: Not at all  Social Connections: Socially Integrated (08/13/2023)   Social Connection and Isolation Panel    Frequency of Communication with Friends and Family: More than three times a week    Frequency of Social Gatherings with Friends and Family: Once a week    Attends Religious Services: More than 4 times per year    Active Member of [employer] or Organizations: Yes    Attends Banker Meetings: 1 to 4 times per year    Marital Status: Married    Tobacco Counseling Counseling given: Yes    Clinical Intake:  Pre-visit preparation completed: Yes  Pain : 0-10 (migrane/lower back) Pain Score: 3  Pain Type: Chronic pain Pain Location: Back Pain Orientation: Lower Pain Descriptors / Indicators: Aching, Constant Pain Onset: More than a month ago Pain Frequency: Constant Pain Relieving Factors: tylenol for migrane  Pain Relieving Factors: tylenol for migrane  BMI - recorded: 31.83 Nutritional Status: BMI > 30  Obese Nutritional Risks: None Diabetes: No  Lab Results   Component Value Date   HGBA1C 5.4 11/07/2021   HGBA1C 5.2 01/23/2021     How often do you need to have someone help you when you read instructions, pamphlets, or other written materials from your doctor or pharmacy?: 1 - Never  Interpreter Needed?: No  Information entered by :: alia t/cma   Activities of Daily Living     08/10/2023   10:06 AM  In your present state of health, do you have any difficulty performing the following activities:  Hearing? 0  Vision? 0  Difficulty concentrating or making decisions? 0  Walking or climbing stairs? 0  Dressing or bathing? 0  Doing errands, shopping? 0  Preparing Food and eating ? N  Using the Toilet? N  In the past six months, have you accidently leaked urine? N  Do you have problems with loss of bowel control? N  Managing your Medications? N  Managing your Finances? N  Housekeeping or managing your Housekeeping? N    Patient Care Team: Zollie Lowers, MD as PCP - General (Family Medicine) Cheyenne Leisure, MD (Internal Medicine)  I have updated your Care Teams any recent Medical Services you may have received from other providers in the past year.     Assessment:   This is a routine wellness examination for Amy Gray.  Hearing/Vision screen Hearing Screening - Comments:: Pt denies hearing dif Vision Screening - Comments:: Pt denies vision dif/pt's last ov 1mos ago   Goals Addressed             This Visit's Progress    DIET - EAT MORE FRUITS AND VEGETABLES   On track    Exercise 150 min/wk Moderate Activity   Not on track    07/31/2021-Pt states she has not been able to exercise due to back issues.     Patient Stated   On track    Getting closer to God.        Depression Screen     08/13/2023    9:29 AM 03/17/2023    4:12 PM 12/15/2022    9:06 AM 10/03/2022    1:20 PM 08/12/2022    9:23 AM 07/03/2022    9:30 AM 05/13/2022    8:56 AM  PHQ 2/9 Scores  PHQ - 2 Score 0 0 0 0 0 0 0  PHQ- 9 Score 0  0  2       Fall  Risk     08/13/2023    9:25 AM 08/10/2023   10:06 AM 12/15/2022    9:06 AM 10/03/2022    1:20 PM 08/12/2022    9:20 AM  Fall Risk   Falls in the past year? 0 1 0 0 0  Number falls in past yr: 0 0  0 0  Injury with Fall? 0 0  0 0  Risk for fall due to : No Fall Risks   No Fall Risks No Fall Risks  Follow up Falls evaluation completed   Falls prevention discussed Falls prevention discussed    MEDICARE RISK AT HOME:  Medicare Risk at Home Any stairs in or around the home?: (Patient-Rptd) No Home free of loose throw rugs in walkways, pet beds, electrical cords, etc?: (Patient-Rptd) No Adequate lighting in your home to reduce risk of falls?: (Patient-Rptd) Yes Life alert?: (Patient-Rptd) No Use of a cane, walker or w/c?: (Patient-Rptd) No Grab bars in the bathroom?: (Patient-Rptd) No Shower chair or bench in shower?: (Patient-Rptd) Yes Elevated toilet seat or a handicapped toilet?: (Patient-Rptd) No  TIMED UP AND GO:  Was the test performed?  no  Cognitive Function: 6CIT completed        08/13/2023    9:29 AM 08/12/2022    9:24 AM 07/31/2021    9:12 AM 07/25/2020    3:00 PM  6CIT Screen  What Year? 0 points 0 points 0 points 0 points  What month? 0 points 0 points 0 points 0 points  What time? 0 points 0 points 0 points 0 points  Count back from 20 0 points 0 points 0 points 0 points  Months in reverse 0 points 0 points 0 points 0 points  Repeat phrase 0 points 0 points 0 points 0 points  Total Score 0 points 0 points 0 points 0 points    Immunizations Immunization History  Administered Date(s) Administered   Influenza Split 12/21/2014, 12/24/2015, 10/23/2016   Influenza, Seasonal, Injecte, Preservative Fre 12/21/2014   Influenza,inj,Quad PF,6+ Mos 12/21/2014, 12/24/2015, 10/23/2016, 01/15/2018, 12/07/2018, 12/05/2020, 12/05/2020   Influenza,inj,quad, With Preservative 12/05/2020   Influenza-Unspecified 10/20/2012, 10/20/2013, 12/21/2014   PNEUMOCOCCAL CONJUGATE-20  12/05/2020   Pneumococcal Conjugate-13 12/21/2014   Td 01/21/2006   Td (Adult),5 Lf Tetanus Toxid, Preservative Free 01/21/2006    Screening Tests Health Maintenance  Topic Date Due   Zoster Vaccines- Shingrix (1 of 2) Never done   Colonoscopy  05/19/2023   MAMMOGRAM  06/02/2023   Cervical Cancer Screening (HPV/Pap Cotest)  12/15/2023 (Originally 07/09/1990)   DTaP/Tdap/Td (3 - Tdap) 12/15/2023 (Originally 01/22/2016)   Hepatitis C Screening  12/15/2023 (Originally 07/09/1978)   HIV Screening  12/15/2023 (Originally 07/09/1975)   COVID-19 Vaccine (1) 08/28/2024 (Originally 07/08/1965)   INFLUENZA VACCINE  08/21/2023   Medicare Annual Wellness (AWV)  08/12/2024   Pneumococcal Vaccine 14-79 Years old  Completed   Hepatitis B Vaccines  Aged Out   HPV VACCINES  Aged Out   Meningococcal B Vaccine  Aged Out    Health Maintenance  Health Maintenance Due  Topic Date Due   Zoster Vaccines- Shingrix (1 of 2) Never done   Colonoscopy  05/19/2023   MAMMOGRAM  06/02/2023   Health Maintenance Items Addressed: Mammogram ordered  Additional Screening:  Vision Screening: Recommended annual ophthalmology exams for early detection of glaucoma and other disorders of the eye. Would you like a referral to an eye doctor? No    Dental  Screening: Recommended annual dental exams for proper oral hygiene  Community Resource Referral / Chronic Care Management: CRR required this visit?  No   CCM required this visit?  No   Plan:    I have personally reviewed and noted the following in the patient's chart:   Medical and social history Use of alcohol, tobacco or illicit drugs  Current medications and supplements including opioid prescriptions. Patient is not currently taking opioid prescriptions. Functional ability and status Nutritional status Physical activity Advanced directives List of other physicians Hospitalizations, surgeries, and ER visits in previous 12 months Vitals Screenings to  include cognitive, depression, and falls Referrals and appointments  In addition, I have reviewed and discussed with patient certain preventive protocols, quality metrics, and best practice recommendations. A written personalized care plan for preventive services as well as general preventive health recommendations were provided to patient.   Ozie Ned, CMA   08/13/2023   After Visit Summary: (MyChart) Due to this being a telephonic visit, the after visit summary with patients personalized plan was offered to patient via MyChart   Notes: FYI PCP to follow up: Pt is aware and due for the following: DTAP/Shingles-per pt don't want to get it. Mammo-ordered/Colonoscopy is already schedule.

## 2023-08-13 NOTE — Patient Instructions (Signed)
 Amy Gray , Thank you for taking time out of your busy schedule to complete your Annual Wellness Visit with me. I enjoyed our conversation and look forward to speaking with you again next year. I, as well as your care team,  appreciate your ongoing commitment to your health goals. Please review the following plan we discussed and let me know if I can assist you in the future. Your Game plan/ To Do List    Follow up Visits: Next Medicare AWV with our clinical staff: 08/15/24 at 8:40a.m.   Next Office Visit with your provider: 08/27/23 at 9:10a.m.  Clinician Recommendations:  Aim for 30 minutes of exercise or brisk walking, 6-8 glasses of water, and 5 servings of fruits and vegetables each day.       This is a list of the screening recommended for you and due dates:  Health Maintenance  Topic Date Due   Zoster (Shingles) Vaccine (1 of 2) Never done   Colon Cancer Screening  05/19/2023   Mammogram  06/02/2023   Medicare Annual Wellness Visit  08/12/2023   Pap with HPV screening  12/15/2023*   DTaP/Tdap/Td vaccine (3 - Tdap) 12/15/2023*   Hepatitis C Screening  12/15/2023*   HIV Screening  12/15/2023*   COVID-19 Vaccine (1) 08/28/2024*   Flu Shot  08/21/2023   Pneumococcal Vaccination  Completed   Hepatitis B Vaccine  Aged Out   HPV Vaccine  Aged Out   Meningitis B Vaccine  Aged Out  *Topic was postponed. The date shown is not the original due date.    Advanced directives: (Declined) Advance directive discussed with you today. Even though you declined this today, please call our office should you change your mind, and we can give you the proper paperwork for you to fill out. Advance Care Planning is important because it:  [x]  Makes sure you receive the medical care that is consistent with your values, goals, and preferences  [x]  It provides guidance to your family and loved ones and reduces their decisional burden about whether or not they are making the right decisions based on your  wishes.  Follow the link provided in your after visit summary or read over the paperwork we have mailed to you to help you started getting your Advance Directives in place. If you need assistance in completing these, please reach out to us  so that we can help you!  See attachments for Preventive Care and Fall Prevention Tips.

## 2023-08-17 ENCOUNTER — Ambulatory Visit: Payer: Self-pay

## 2023-08-17 ENCOUNTER — Ambulatory Visit (INDEPENDENT_AMBULATORY_CARE_PROVIDER_SITE_OTHER): Admitting: Nurse Practitioner

## 2023-08-17 ENCOUNTER — Encounter: Payer: Self-pay | Admitting: Nurse Practitioner

## 2023-08-17 VITALS — BP 99/68 | HR 77 | Temp 98.2°F | Ht 60.0 in | Wt 168.4 lb

## 2023-08-17 DIAGNOSIS — L2989 Other pruritus: Secondary | ICD-10-CM | POA: Diagnosis not present

## 2023-08-17 DIAGNOSIS — R053 Chronic cough: Secondary | ICD-10-CM

## 2023-08-17 MED ORDER — PREDNISONE 10 MG (21) PO TBPK: ORAL_TABLET | ORAL | 0 refills | Status: DC

## 2023-08-17 MED ORDER — TRIAMCINOLONE ACETONIDE 0.1 % EX CREA: 1.0000 | TOPICAL_CREAM | Freq: Two times a day (BID) | CUTANEOUS | 1 refills | Status: DC

## 2023-08-17 MED ORDER — CETIRIZINE HCL 10 MG PO TABS: 10.0000 mg | ORAL_TABLET | Freq: Every day | ORAL | 0 refills | Status: DC

## 2023-08-17 NOTE — Telephone Encounter (Signed)
 Copied from CRM [id number]. Topic: Clinical - Red Word Triage >> Aug 17, 2023  8:10 AM Delon HERO wrote: Red Word that prompted transfer to Nurse Triage: Patient is calling to report a rash on her back, that has spread to the back her neck, left arm, right arm, left leg. With redness and itching. Answer Assessment - Initial Assessment Questions 1. APPEARANCE of RASH: What does the rash look like? (e.g., blisters, dry flaky skin, red spots, redness, sores)     Red,raised 2. SIZE: How big are the spots? (e.g., tip of pen, eraser, coin; inches, centimeters)     Tip of pain 3. LOCATION: Where is the rash located?     Trunk, arms, legs, neck 4. COLOR: What color is the rash? (Note: It is difficult to assess rash color in people with darker-colored skin. When this situation occurs, simply ask the caller to describe what they see.)     red 5. ONSET: When did the rash begin?     Friday 6. FEVER: Do you have a fever? If Yes, ask: What is your temperature, how was it measured, and when did it start?     no 7. ITCHING: Does the rash itch? If Yes, ask: How bad is the itch? (Scale 1-10; or mild, moderate, severe)     yes 8. CAUSE: What do you think is causing the rash?     unsure 9. MEDICINE FACTORS: Have you started any new medicines within the last 2 weeks? (e.g., antibiotics)      no 10. OTHER SYMPTOMS: Do you have any other symptoms? (e.g., dizziness, headache, sore throat, joint pain)       no 11. PREGNANCY: Is there any chance you are pregnant? When was your last menstrual period?       no  Protocols used: Rash or Redness - Widespread-A-AH  Reason for Disposition . SEVERE itching (i.e., interferes with sleep, normal activities or school)  Answer Assessment - Initial Assessment Questions 1. APPEARANCE of RASH: What does the rash look like? (e.g., blisters, dry flaky skin, red spots, redness, sores)     Red,raised 2. SIZE: How big are the spots? (e.g., tip of  pen, eraser, coin; inches, centimeters)     Tip of pain 3. LOCATION: Where is the rash located?     Trunk, arms, legs, neck 4. COLOR: What color is the rash? (Note: It is difficult to assess rash color in people with darker-colored skin. When this situation occurs, simply ask the caller to describe what they see.)     red 5. ONSET: When did the rash begin?     Friday 6. FEVER: Do you have a fever? If Yes, ask: What is your temperature, how was it measured, and when did it start?     no 7. ITCHING: Does the rash itch? If Yes, ask: How bad is the itch? (Scale 1-10; or mild, moderate, severe)     yes 8. CAUSE: What do you think is causing the rash?     unsure 9. MEDICINE FACTORS: Have you started any new medicines within the last 2 weeks? (e.g., antibiotics)      no 10. OTHER SYMPTOMS: Do you have any other symptoms? (e.g., dizziness, headache, sore throat, joint pain)       no 11. PREGNANCY: Is there any chance you are pregnant? When was your last menstrual period?       no  Protocols used: Rash or Redness - [facility]

## 2023-08-17 NOTE — Progress Notes (Signed)
 Acute Office Visit  Subjective:     Patient ID: Amy Gray, female    DOB: [birth year], 63 y.o.   MRN: [id number]  Chief Complaint  Patient presents with   Rash    Itching mostly on back but goes all over. Started Friday     HPI Amy Gray is a 63 year old female who presents on 08/17/2023 for an acute visit due to a rash. The patient reports a pruritic rash that began on her back and has spread to her right arm over the past 3 days. She has been using over-the-counter Benadryl  cream with minimal relief. She describes the rash as initially resembling mosquito bites that gradually became larger. She denies using any new soaps, detergents, or personal care products. She also denies associated fever, chills, or shortness of breath.  She does report a persistent dry, non-productive cough that she states has been present since 2023, for which she was diagnosed with bronchitis and severe asthma.  Active Ambulatory Problems    Diagnosis Date Noted   Airway hyperreactivity 03/01/2014   Mixed hyperlipidemia 03/01/2014   Generalized anxiety disorder 10/03/2014   Restless leg syndrome, uncontrolled 10/03/2014   Fibromyalgia syndrome 10/24/2015   Chronic migraine without aura without status migrainosus, not intractable 10/24/2015   IIH (idiopathic intracranial hypertension) 11/24/2016   Dizziness 11/24/2016   Arthralgia 07/15/2017   Normal pressure hydrocephalus (HCC) 10/16/2017   Non-seasonal allergic rhinitis 03/30/2019   Vitamin B12 deficiency 03/30/2019   Insomnia due to other mental disorder 03/30/2019   Vitamin D  deficiency 03/30/2019   Hot flashes due to menopause 03/30/2019   Acute pulmonary embolism (HCC) 02/10/2022   Abdominal aortic atherosclerosis (HCC) 05/13/2022   Gastroesophageal reflux disease with esophagitis without hemorrhage 05/13/2022   Bronchiectasis without complication (HCC) 12/15/2022   Anterior leg pain, right 02/18/2023   Pruritic erythematous rash  08/17/2023   Resolved Ambulatory Problems    Diagnosis Date Noted   Cervical spinal cord compression (HCC) 10/10/2013   Awareness of heartbeats 03/01/2014   Wellness examination 12/24/2014   Hypotension due to drugs 11/24/2016   Past Medical History:  Diagnosis Date   Anxiety    Asthma    COPD (chronic obstructive pulmonary disease) (HCC)    Depression    Emphysema of lung (HCC)    GERD (gastroesophageal reflux disease)    Hyperlipidemia    Migraines    Osteoarthritis    Sleep apnea     Review of Systems  Constitutional:  Negative for chills and fever.  HENT:  Negative for sore throat and tinnitus.   Respiratory:  Negative for cough, shortness of breath and wheezing.   Cardiovascular:  Negative for chest pain and leg swelling.  Skin:  Positive for itching and rash.       Back, arm  Neurological:  Negative for dizziness and headaches.   Negative unless indicated in HPI    Objective:    BP 99/68   Pulse 77   Temp 98.2 F (36.8 C) (Temporal)   Ht 5' (1.524 m)   Wt 168 lb 6.4 oz (76.4 kg)   SpO2 97%   BMI 32.89 kg/m    Physical Exam Vitals and nursing note reviewed.  Constitutional:      General: She is not in acute distress. HENT:     Head: Normocephalic and atraumatic.     Nose: Nose normal.     Mouth/Throat:     Mouth: Mucous membranes are moist.  Eyes:     General: No scleral  icterus.    Extraocular Movements: Extraocular movements intact.     Conjunctiva/sclera: Conjunctivae normal.     Pupils: Pupils are equal, round, and reactive to light.  Cardiovascular:     Heart sounds: Normal heart sounds.  Pulmonary:     Effort: Pulmonary effort is normal.     Breath sounds: Normal breath sounds.  Skin:    General: Skin is warm and dry.     Capillary Refill: Capillary refill takes less than 2 seconds.     Findings: Rash present. Rash is purpuric.  Neurological:     Mental Status: She is alert.  Psychiatric:        Mood and Affect: Mood normal.         Behavior: Behavior normal.        Thought Content: Thought content normal.        Judgment: Judgment normal.       Pertinent labs & imaging results that were available during my care of the patient were reviewed by me and considered in my medical decision making.  No results found for any visits on 08/17/23.      Assessment & Plan:  Pruritic erythematous rash -     Triamcinolone  Acetonide; Apply 1 Application topically 2 (two) times daily.  Dispense: 60 g; Refill: 1 -     Cetirizine  HCl; Take 1 tablet (10 mg total) by mouth daily.  Dispense: 30 tablet; Refill: 0  Chronic cough -     predniSONE ; Use as directed on back of pill pack  Dispense: 21 tablet; Refill: 0   Amy Gray is a 63 year old Caucasian female seen today for pruritic rash, no acute distress. Pruritic rash with spread over 3 days - likely contact dermatitis vs insect bites vs allergic reaction  Chronic dry cough - likely related to underlying asthma and/or post-infectious bronchitis (chronic)  Pruritic rash: Triamcinolone  twice daily, Zyrtec  daily prednisone  Dosepak Use mild hypoallergenic soap,   Advise patient to avoid scratching and keep skin moisturized with fragrance-free emollients  CeraVe, Eucerin). -Educate on avoiding potential irritants/allergens even if no new exposures are identified. - Monitor for signs of infection: Educate patient on signs of secondary infection (increased redness, warmth, pus, fever). If signs develop, return for re-evaluation. May need oral antibiotics if bacterial superinfection suspected. Cough/asthma: Medrol  Dosepak, continue already prescribed asthma medication  The above assessment and management plan was discussed with the patient. The patient verbalized understanding of and has agreed to the management plan. Patient is aware to call the clinic if they develop any new symptoms or if symptoms persist or worsen. Patient is aware when to return to the clinic for a follow-up visit.  Patient educated on when it is appropriate to go to the emergency department.  Return if symptoms worsen or fail to improve.  Domanic Matusek [place] Thompson, DNP [facility] [address] [place], [place] [address] [phone]  Note: This document was prepared by [employer] voice dictation technology and any errors that results from this process are unintentional.

## 2023-08-19 DIAGNOSIS — M533 Sacrococcygeal disorders, not elsewhere classified: Secondary | ICD-10-CM | POA: Diagnosis not present

## 2023-08-19 NOTE — Progress Notes (Signed)
 I have reviewed the patient's medical history in detail; there are no changes to the history as noted in the electronic medical record. Patient identified by surgeon and surgical site confirmed by patient and surgeon.   Today's Procedure: sacroiliac joint injection Side:  Left  Additional info: none   Consider:  follow-up, next evaluation   Medicine changes discussed: no   Allergies[1]    Follow up in about 6 weeks (around 09/30/2023) for Post procedure assessment APP.     Date of service: 08/19/2023  Surgeon: Reyes FORBES Salter, MD, MD  PREOPERATIVE DIAGNOSIS:  Sacroilitis, chronic; lumbago without radiculopathy  POSTOPERATIVE DIAGNOSIS:      Sacroilitis, chronic; lumbago without radiculopathy  PROCEDURE: SACROILIAC JOINT INJECTION WITH FLUOROSCOPY left   THERAPEUTIC AGENT:  1cc 0.25% bupivacaine  and   40mg  depomedrol per side  BLOOD LOSS: minimal  COMPLICATIONS: none  DESCRIPTION: After obtaining informed consent, the patient was positioned on the padded fluoroscopy table in the prone position. The skin overlying the sacroiliac joint was prepped with antiseptic solution and draped in the usual fashion. Fluoroscopy was used to identify the site for needle insertion. Local anesthesia was administered at this site. A sterile block needle was advanced through the anesthetized skin and manipulated into the sacroiliac joint under fluoroscopic observation. Aspiration was negative. The therapeutic agent was administered in increments. The needle was removed intact and discarded.   The patient tolerated the procedure well and recovered uneventfully in the pain center recovery room. The procedure was performed as stated above. Discharge instructions were given.   ADDENDUM: left preformed Fluoroscopy time and Exposure, mRad were recorded and scanned into chart for procedure. Image count stored.       [1] Allergies Allergen Reactions  . Lyrica Shortness Of Breath  .  Pregabalin Shortness Of Breath  . Amitriptyline Unknown    update  . Diclofenac Other    Other reaction(s): Other Other reaction(s): Other Update Update Update Update  . Diclofenac Sodium Other    Update Update  . Duloxetine Unknown    update  . Effexor [Venlafaxine] Other    Restless legs  . Guaifenesin Unknown  . Imipramine Hcl Unknown    update  . Oxybutynin Agitation    update  . Parafon Forte Dsc Other    Decrease in BP   . Propranolol Unknown    update  . Reglan [Metoclopramide] Other    Restless legs  . Sibutramine Other    update  . Topiramate  Unknown    update  . Cymbalta [Duloxetine Hcl]   . Gabapentin Confusion

## 2023-08-27 ENCOUNTER — Ambulatory Visit: Admitting: Family Medicine

## 2023-08-27 ENCOUNTER — Encounter: Payer: Self-pay | Admitting: Family Medicine

## 2023-08-27 VITALS — BP 99/64 | HR 86 | Temp 97.2°F | Ht 61.0 in | Wt 167.8 lb

## 2023-08-27 DIAGNOSIS — L2989 Other pruritus: Secondary | ICD-10-CM | POA: Diagnosis not present

## 2023-08-27 DIAGNOSIS — G912 (Idiopathic) normal pressure hydrocephalus: Secondary | ICD-10-CM | POA: Diagnosis not present

## 2023-08-27 DIAGNOSIS — E538 Deficiency of other specified B group vitamins: Secondary | ICD-10-CM | POA: Diagnosis not present

## 2023-08-27 DIAGNOSIS — Z1231 Encounter for screening mammogram for malignant neoplasm of breast: Secondary | ICD-10-CM

## 2023-08-27 DIAGNOSIS — K2100 Gastro-esophageal reflux disease with esophagitis, without bleeding: Secondary | ICD-10-CM

## 2023-08-27 DIAGNOSIS — E782 Mixed hyperlipidemia: Secondary | ICD-10-CM

## 2023-08-27 DIAGNOSIS — J479 Bronchiectasis, uncomplicated: Secondary | ICD-10-CM | POA: Diagnosis not present

## 2023-08-27 LAB — LIPID PANEL

## 2023-08-27 MED ORDER — METHYLPREDNISOLONE 4 MG PO TBPK
ORAL_TABLET | ORAL | 0 refills | Status: DC
Start: 2023-08-27 — End: 2023-12-04

## 2023-08-27 NOTE — Progress Notes (Signed)
 Subjective:  Patient ID: Amy Gray, female    DOB: 1960/02/23  Age: 63 y.o. MRN: [id number]  CC: Medical Management of Chronic Issues   HPI Amy Gray presents for ROUTINE FOLLOW UP. sTILL COUGHING. sEEING [facility]. They put her back on Tezpire.  This is meant to keep her bronchiectasis under control rash getting a little pruritic again at the posterior shoulders. continues after recent treatment.  Dr. Baldwin giving botox q 3 mos. Helps migraine.  Headaches are also exacerbated by history of normal pressure hydrocephalus  Patient in for follow-up of elevated cholesterol. Doing well without complaints on current medication. Denies side effects of statin including myalgia and arthralgia and nausea. Also in today for liver function testing. Currently no chest pain, shortness of breath or other cardiovascular related symptoms noted.  Patient in for follow-up of GERD. Currently asymptomatic taking  PPI daily. There is no chest pain or heartburn. No hematemesis and no melena. No dysphagia or choking. Onset is remote. Progression is stable. Complicating factors, none.       08/27/2023    9:00 AM 08/17/2023    9:54 AM 08/13/2023    9:29 AM  Depression screen PHQ 2/9  Decreased Interest 0 0 0  Down, Depressed, Hopeless 0 0 0  PHQ - 2 Score 0 0 0  Altered sleeping  0 0  Tired, decreased energy  0 0  Change in appetite  0 0  Feeling bad or failure about yourself   0 0  Trouble concentrating  0 0  Moving slowly or fidgety/restless  0 0  Suicidal thoughts  0 0  PHQ-9 Score  0 0  Difficult doing work/chores  Not difficult at all Not difficult at all    History Amy Gray has a past medical history of Anxiety, Asthma, COPD (chronic obstructive pulmonary disease) (HCC), Depression, Emphysema of lung (HCC), GERD (gastroesophageal reflux disease), Hyperlipidemia, Migraines, Osteoarthritis, and Sleep apnea.   She has a past surgical history that includes Cervical fusion (01/20/2013);  Rhinoplasty; Abdominal hysterectomy; Brain surgery (05/2016); Spine surgery (2015); and Tubal ligation (1986).   Her family history includes Arthritis in her father and mother; Asthma in her mother; Heart attack in her father; Heart disease in her father and mother; Hypertension in her sister.She reports that she has never smoked. She has never used smokeless tobacco. She reports that she does not drink alcohol and does not use drugs.    ROS Review of Systems  Constitutional: Negative.   HENT:  Negative for congestion.   Eyes:  Negative for visual disturbance.  Respiratory:  Negative for shortness of breath.   Cardiovascular:  Negative for chest pain.  Gastrointestinal:  Negative for abdominal pain, constipation, diarrhea, nausea and vomiting.  Genitourinary:  Negative for difficulty urinating.  Musculoskeletal:  Negative for arthralgias and myalgias.  Neurological:  Positive for headaches.  Psychiatric/Behavioral:  Negative for sleep disturbance.     Objective:  BP 99/64   Pulse 86   Temp (!) 97.2 F (36.2 C)   Ht 5' 1 (1.549 m)   Wt 167 lb 12.8 oz (76.1 kg)   SpO2 97%   BMI 31.71 kg/m   BP Readings from Last 3 Encounters:  08/27/23 99/64  08/17/23 99/68  08/13/23 112/75    Wt Readings from Last 3 Encounters:  08/27/23 167 lb 12.8 oz (76.1 kg)  08/17/23 168 lb 6.4 oz (76.4 kg)  08/13/23 163 lb (73.9 kg)     Physical Exam Constitutional:  General: She is not in acute distress.    Appearance: She is well-developed.  HENT:     Head: Normocephalic and atraumatic.  Eyes:     Conjunctiva/sclera: Conjunctivae normal.     Pupils: Pupils are equal, round, and reactive to light.  Neck:     Thyroid : No thyromegaly.  Cardiovascular:     Rate and Rhythm: Normal rate and regular rhythm.     Heart sounds: Normal heart sounds. No murmur heard. Pulmonary:     Effort: Pulmonary effort is normal. No respiratory distress.     Breath sounds: Normal breath sounds. No  wheezing or rales.  Abdominal:     General: Bowel sounds are normal. There is no distension.     Palpations: Abdomen is soft.     Tenderness: There is no abdominal tenderness.  Musculoskeletal:        General: Normal range of motion.     Cervical back: Normal range of motion and neck supple.  Lymphadenopathy:     Cervical: No cervical adenopathy.  Skin:    General: Skin is warm and dry.  Neurological:     Mental Status: She is alert and oriented to person, place, and time.  Psychiatric:        Behavior: Behavior normal.        Thought Content: Thought content normal.        Judgment: Judgment normal.      Assessment & Plan:  Vitamin B12 deficiency -     Vitamin B12 -     CBC with Differential/Platelet -     CMP14+EGFR  Pruritic erythematous rash -     CBC with Differential/Platelet -     CMP14+EGFR  Bronchiectasis without complication (HCC) -     CBC with Differential/Platelet -     CMP14+EGFR  Normal pressure hydrocephalus (HCC) -     CBC with Differential/Platelet -     CMP14+EGFR  Mixed hyperlipidemia -     Lipid panel -     CBC with Differential/Platelet -     CMP14+EGFR  Gastroesophageal reflux disease with esophagitis without hemorrhage -     CBC with Differential/Platelet -     CMP14+EGFR  Encounter for screening mammogram for malignant neoplasm of breast -     3D Screening Mammogram, Left and Right; Future  Other orders -     methylPREDNISolone ; Start with 6 on the first day and take one less each day until finished  Dispense: 21 tablet; Refill: 0     Follow-up: Return in about 6 months (around 02/27/2024) for [facility] physical.  Butler Der, M.D.

## 2023-08-28 LAB — CMP14+EGFR
ALT: 16 IU/L (ref 0–32)
AST: 18 IU/L (ref 0–40)
Albumin: 4.2 g/dL (ref 3.9–4.9)
Alkaline Phosphatase: 122 IU/L — AB (ref 44–121)
BUN/Creatinine Ratio: 15 (ref 12–28)
BUN: 16 mg/dL (ref 8–27)
Bilirubin Total: 0.3 mg/dL (ref 0.0–1.2)
CO2: 19 mmol/L — AB (ref 20–29)
Calcium: 9.4 mg/dL (ref 8.7–10.3)
Chloride: 106 mmol/L (ref 96–106)
Creatinine, Ser: 1.07 mg/dL — AB (ref 0.57–1.00)
Globulin, Total: 2.1 g/dL (ref 1.5–4.5)
Glucose: 82 mg/dL (ref 70–99)
Potassium: 3.9 mmol/L (ref 3.5–5.2)
Sodium: 140 mmol/L (ref 134–144)
Total Protein: 6.3 g/dL (ref 6.0–8.5)
eGFR: 58 mL/min/1.73 — AB (ref >59)

## 2023-08-28 LAB — CBC WITH DIFFERENTIAL/PLATELET
Basophils Absolute: 0 x10E3/uL (ref 0.0–0.2)
Basos: 0 %
EOS (ABSOLUTE): 0.2 x10E3/uL (ref 0.0–0.4)
Eos: 2 %
Hematocrit: 41.1 % (ref 34.0–46.6)
Hemoglobin: 13.3 g/dL (ref 11.1–15.9)
Immature Grans (Abs): 0 x10E3/uL (ref 0.0–0.1)
Immature Granulocytes: 0 %
Lymphocytes Absolute: 2.4 x10E3/uL (ref 0.7–3.1)
Lymphs: 37 %
MCH: 30.6 pg (ref 26.6–33.0)
MCHC: 32.4 g/dL (ref 31.5–35.7)
MCV: 95 fL (ref 79–97)
Monocytes Absolute: 0.8 x10E3/uL (ref 0.1–0.9)
Monocytes: 12 %
Neutrophils Absolute: 3.1 x10E3/uL (ref 1.4–7.0)
Neutrophils: 48 %
Platelets: 250 x10E3/uL (ref 150–450)
RBC: 4.35 x10E6/uL (ref 3.77–5.28)
RDW: 12.8 % (ref 11.7–15.4)
WBC: 6.4 x10E3/uL (ref 3.4–10.8)

## 2023-08-28 LAB — LIPID PANEL
Cholesterol, Total: 175 mg/dL (ref 100–199)
HDL: 64 mg/dL (ref >39)
LDL CALC COMMENT:: 2.7 ratio (ref 0.0–4.4)
LDL Chol Calc (NIH): 90 mg/dL (ref 0–99)
Triglycerides: 122 mg/dL (ref 0–149)
VLDL Cholesterol Cal: 21 mg/dL (ref 5–40)

## 2023-08-28 LAB — VITAMIN B12: Vitamin B-12: 631 pg/mL (ref 232–1245)

## 2023-08-30 ENCOUNTER — Encounter: Payer: Self-pay | Admitting: Family Medicine

## 2023-08-31 ENCOUNTER — Ambulatory Visit: Payer: Self-pay | Admitting: Family Medicine

## 2023-09-01 ENCOUNTER — Telehealth: Payer: Self-pay

## 2023-09-01 ENCOUNTER — Encounter: Payer: Self-pay | Admitting: Family

## 2023-09-01 ENCOUNTER — Ambulatory Visit (INDEPENDENT_AMBULATORY_CARE_PROVIDER_SITE_OTHER): Admitting: Family

## 2023-09-01 ENCOUNTER — Ambulatory Visit: Payer: Self-pay

## 2023-09-01 ENCOUNTER — Ambulatory Visit (INDEPENDENT_AMBULATORY_CARE_PROVIDER_SITE_OTHER)

## 2023-09-01 VITALS — BP 104/69 | HR 88 | Temp 96.3°F | Ht 61.0 in | Wt 168.0 lb

## 2023-09-01 DIAGNOSIS — R109 Unspecified abdominal pain: Secondary | ICD-10-CM

## 2023-09-01 DIAGNOSIS — I878 Other specified disorders of veins: Secondary | ICD-10-CM | POA: Diagnosis not present

## 2023-09-01 DIAGNOSIS — R1030 Lower abdominal pain, unspecified: Secondary | ICD-10-CM

## 2023-09-01 DIAGNOSIS — Z452 Encounter for adjustment and management of vascular access device: Secondary | ICD-10-CM | POA: Diagnosis not present

## 2023-09-01 LAB — URINALYSIS, COMPLETE
Bilirubin, UA: NEGATIVE
Glucose, UA: NEGATIVE
Leukocytes,UA: NEGATIVE
Nitrite, UA: NEGATIVE
Protein,UA: NEGATIVE
RBC, UA: NEGATIVE
Specific Gravity, UA: 1.025 (ref 1.005–1.030)
Urobilinogen, Ur: 0.2 mg/dL (ref 0.2–1.0)
pH, UA: 5.5 (ref 5.0–7.5)

## 2023-09-01 LAB — MICROSCOPIC EXAMINATION
Bacteria, UA: NONE SEEN
Crystals: POSITIVE — AB
Epithelial Cells (non renal): NONE SEEN /HPF (ref 0–10)
RBC, Urine: NONE SEEN /HPF (ref 0–2)

## 2023-09-01 NOTE — Telephone Encounter (Signed)
 FYI Only or Action Required?: FYI only for provider.  Patient was last seen in primary care on 08/27/2023 by Zollie Lowers, MD.  Called Nurse Triage reporting Flank Pain.  Symptoms began several days ago.  Interventions attempted: Rest, hydration, or home remedies.  Symptoms are: unchanged.  Triage Disposition: See HCP Within 4 Hours (Or PCP Triage)  Patient/caregiver understands and will follow disposition?: Yes    Copied from CRM [id number]. Topic: Clinical - Red Word Triage >> Sep 01, 2023 12:13 PM Ivette P wrote: Red Word that prompted transfer to Nurse Triage: pain on the right side, starting drinking fluids but not going to the bathroom normally. Reason for Disposition  Pain or burning with passing urine (urination)  Answer Assessment - Initial Assessment Questions 1. LOCATION: Where does it hurt? (e.g., left, right)     Right side 2. ONSET: When did the pain start?     Sunday night  3. SEVERITY: How bad is the pain? (e.g., Scale 1-10; mild, moderate, or severe)     4/10 4. PATTERN: Does the pain come and go, or is it constant?      constant 5. CAUSE: What do you think is causing the pain?     Uti  6. OTHER SYMPTOMS:  Do you have any other symptoms? (e.g., fever, abdomen pain, vomiting, leg weakness, burning with urination, blood in urine)     Urinary frequency, dribbling, abd bloating  Protocols used: Flank Pain-A-AH

## 2023-09-01 NOTE — Telephone Encounter (Signed)
 CALLED PATIENT, NO ANSWER, NO OPTION TO LEAVE VOICE MESSAGE

## 2023-09-01 NOTE — Progress Notes (Signed)
 Subjective:    Patient ID: Amy Gray, female    DOB: 01-05-1961, 63 y.o.   MRN: [id number]  Chief Complaint  Patient presents with   Flank Pain    Right side started Sunday night trouble urinating    Pt presents to the office today with right flank pain.  Flank Pain This is a new problem. The current episode started in the past 7 days. The problem occurs constantly. The problem is unchanged. Pain location: right flank. Pertinent negatives include no headaches, paresis or weakness. (Pelvic pressure, urinary frequency, hesitancy ) Treatments tried: force fluids. The treatment provided no relief.      Review of Systems  Genitourinary:  Positive for flank pain.  Neurological:  Negative for weakness and headaches.  All other systems reviewed and are negative.   Social History   Socioeconomic History   Marital status: Married    Spouse name: Not on file   Number of children: 2   Years of education: Not on file   Highest education level: 12th grade  Occupational History   Occupation: Disabled  Tobacco Use   Smoking status: Never   Smokeless tobacco: Never  Substance and Sexual Activity   Alcohol use: No   Drug use: No   Sexual activity: Not on file  Other Topics Concern   Not on file  Social History Narrative   Not on file   Social Drivers of Health   Financial Resource Strain: Low Risk  (08/26/2023)   Overall Financial Resource Strain (CARDIA)    Difficulty of Paying Living Expenses: Not hard at all  Food Insecurity: No Food Insecurity (08/26/2023)   Hunger Vital Sign    Worried About Running Out of Food in the Last Year: Never true    Ran Out of Food in the Last Year: Never true  Transportation Needs: No Transportation Needs (08/26/2023)   PRAPARE - Administrator, Civil Service (Medical): No    Lack of Transportation (Non-Medical): No  Physical Activity: Inactive (08/26/2023)   Exercise Vital Sign    Days of Exercise per Week: 0 days    Minutes of  Exercise per Session: Not on file  Stress: No Stress Concern Present (08/26/2023)   [employer] of Occupational Health - Occupational Stress Questionnaire    Feeling of Stress: Not at all  Social Connections: Socially Integrated (08/26/2023)   Social Connection and Isolation Panel    Frequency of Communication with Friends and Family: More than three times a week    Frequency of Social Gatherings with Friends and Family: Twice a week    Attends Religious Services: More than 4 times per year    Active Member of [employer] or Organizations: Yes    Attends Engineer, structural: More than 4 times per year    Marital Status: Married   Family History  Problem Relation Age of Onset   Arthritis Mother    Asthma Mother    Heart disease Mother    Arthritis Father    Heart disease Father    Heart attack Father    Hypertension Sister         Objective:   Physical Exam Vitals reviewed.  Constitutional:      General: She is not in acute distress.    Appearance: She is well-developed.  HENT:     Head: Normocephalic and atraumatic.  Eyes:     Pupils: Pupils are equal, round, and reactive to light.  Neck:  Thyroid : No thyromegaly.  Cardiovascular:     Rate and Rhythm: Normal rate and regular rhythm.     Heart sounds: Normal heart sounds. No murmur heard. Pulmonary:     Effort: Pulmonary effort is normal. No respiratory distress.     Breath sounds: Normal breath sounds. No wheezing.  Abdominal:     General: Bowel sounds are normal. There is no distension.     Palpations: Abdomen is soft.     Tenderness: There is abdominal tenderness (mild lower abdomen tenderness). There is no right CVA tenderness or left CVA tenderness.  Musculoskeletal:        General: No tenderness. Normal range of motion.     Cervical back: Normal range of motion and neck supple.  Skin:    General: Skin is warm and dry.  Neurological:     Mental Status: She is alert and oriented to person, place, and  time.     Cranial Nerves: No cranial nerve deficit.     Deep Tendon Reflexes: Reflexes are normal and symmetric.  Psychiatric:        Behavior: Behavior normal.        Thought Content: Thought content normal.        Judgment: Judgment normal.       BP 104/69   Pulse 88   Temp (!) 96.3 F (35.7 C) (Temporal)   Ht 5' 1 (1.549 m)   Wt 168 lb (76.2 kg)   BMI 31.74 kg/m      Assessment & Plan:  Tamzin Bertling comes in today with chief complaint of Flank Pain (Right side started Sunday night trouble urinating )   Diagnosis and orders addressed:  1. Flank pain (Primary) - Urinalysis, Complete - Urine Culture; Future - Urine Culture - DG Abd 1 View; Future  2. Lower abdominal pain - CT RENAL STONE STUDY; Future   Labs pending CT pending to rule out stone. No hx of stones.  Force fluids If positive will send in Flomax Approx 44 mins spent with patient, chart review, and education   Bari Learn, [place]

## 2023-09-01 NOTE — Telephone Encounter (Signed)
Has appointment in office today 

## 2023-09-01 NOTE — Patient Instructions (Signed)
 Kidney Stones  Kidney stones are solid, rock-like deposits that form inside of the kidneys. The kidneys are a pair of organs that make urine. A kidney stone may form in a kidney and move into other parts of the urinary tract, including the tubes that connect the kidneys to the bladder (ureters), the bladder, and the tube that carries urine out of the body (urethra). As the stone moves through these areas, it can cause intense pain and block the flow of urine. Kidney stones are created when high levels of certain minerals are found in the urine. The stones are usually passed out of the body through urination, but in some cases, medical treatment may be needed to remove them. What are the causes? Kidney stones may be caused by: A condition in which certain glands produce too much parathyroid hormone (primary hyperparathyroidism), which causes too much calcium  buildup in the blood. A buildup of uric acid crystals in the bladder (hyperuricosuria). Uric acid is a chemical that the body produces when you eat certain foods. It usually leaves the body in the urine. Narrowing (stricture) of one or both of the ureters. A kidney blockage that is present at birth (congenital obstruction). Past surgery on the kidney or the ureters. What increases the risk? The following factors may make you more likely to develop this condition: Having had a kidney stone in the past. Having a family history of kidney stones. Not drinking enough water. Eating a diet that is high in protein, salt (sodium), or sugar. Being overweight or obese. What are the signs or symptoms? Symptoms of a kidney stone may include: Pain in the side of the abdomen, right below the ribs (flank pain). Pain usually spreads (radiates) to the groin. Needing to urinate often or urgently. Painful urination. Blood in the urine (hematuria). Nausea. Vomiting. Fever and chills. How is this diagnosed? This condition may be diagnosed based on: Your  symptoms and medical history. A physical exam. Blood tests. Urine tests. These may be done before and after the stone passes out of your body through urination. Imaging tests, such as a CT scan, abdominal X-ray, or ultrasound. A procedure to examine the inside of the bladder (cystoscopy). How is this treated? Treatment for kidney stones depends on the size, location, and makeup of the stones. Kidney stones will often pass out of the body through urination. You may need to: Increase your fluid intake to help pass the stone. In some cases, you may be given fluids through an IV and may need to be monitored in the hospital. Take medicine for pain. Make changes in your diet to help prevent kidney stones from coming back. Sometimes, procedures are needed to remove a kidney stone. This may involve: A procedure to break up kidney stones using: A focused beam of light (laser therapy). Shock waves (extracorporeal shock wave lithotripsy). Surgery to remove kidney stones. This may be needed if you have severe pain or have stones that block your urinary tract. Follow these instructions at home: Medicines Take over-the-counter and prescription medicines only as told by your health care provider. Ask your health care provider if the medicine prescribed to you requires you to avoid driving or using heavy machinery. Eating and drinking Drink enough fluid to keep your urine pale yellow. You may be instructed to drink at least 8-10 glasses of water each day. This will help you pass the kidney stone. If directed, change your diet. This may include: Limiting how much sodium you eat. Eating more fruits  and vegetables. Limiting how much animal protein you eat. Animal proteins include red meat, poultry, fish, and eggs. Eating a normal amount of calcium  (1,000-1,300 mg per day). Follow instructions from your health care provider about eating or drinking restrictions. General instructions Collect urine samples as  told by your health care provider. You may need to collect a urine sample: 24 hours after you pass the stone. 8-12 weeks after you pass the kidney stone, and every 6-12 months after that. Strain your urine every time you urinate, for as long as directed. Use the strainer that your health care provider recommends. Do not throw out the kidney stone after passing it. Keep the stone so it can be tested by your health care provider. Testing the makeup of your kidney stone may help prevent you from getting kidney stones in the future. Keep all follow-up visits. You may need follow-up X-rays or ultrasounds to make sure that your stone has passed. How is this prevented? To prevent another kidney stone: Drink enough fluid to keep your urine pale yellow. This is the best way to prevent kidney stones. Eat a healthy diet. Follow recommendations from your health care provider about foods to avoid. Recommendations vary depending on the type of kidney stone that you have. You may be instructed to eat a low-protein diet. Maintain a healthy weight. Where to find more information National Kidney Foundation (NKF): www.kidney.org [facility]): www.urologyhealth.org Contact a health care provider if: You have pain that gets worse or does not get better with medicine. Get help right away if: You have a fever or chills. You develop severe pain. You develop new abdominal pain. You faint. You are unable to urinate. Summary Kidney stones are solid, rock-like deposits that form inside of the kidneys. Kidney stones can cause nausea, vomiting, blood in the urine, abdominal pain, and the urge to urinate often. Treatment for kidney stones depends on the size, location, and makeup of the stones. Kidney stones will often pass out of the body through urination. Kidney stones can be prevented by drinking enough fluids, eating a healthy diet, and maintaining a healthy weight. This information is not intended  to replace advice given to you by your health care provider. Make sure you discuss any questions you have with your health care provider. Document Revised: 04/17/2021 Document Reviewed: 04/17/2021 Elsevier Patient Education  2024 [employer].

## 2023-09-01 NOTE — Telephone Encounter (Signed)
 noted

## 2023-09-01 NOTE — Telephone Encounter (Signed)
 Copied from CRM [id number]. Topic: Clinical - Request for Lab/Test Order >> Sep 01, 2023  3:54 PM Myrick T wrote: Reason for CRM: patient called said getting to [facility] was not good for her and request to have he CT done in [place] or [employer] at [place]. Please f/u with patient >> Sep 01, 2023  4:09 PM Zane F wrote: Patient contacted the [facility] imaging office and was able to schedule her CT Renal Stone Study for 09/02/2023 at 10:00am in [place] . Patient no longer requires callback.   Callback Number: [phone]

## 2023-09-02 ENCOUNTER — Ambulatory Visit (INDEPENDENT_AMBULATORY_CARE_PROVIDER_SITE_OTHER)

## 2023-09-02 ENCOUNTER — Ambulatory Visit (HOSPITAL_COMMUNITY)

## 2023-09-02 DIAGNOSIS — K5730 Diverticulosis of large intestine without perforation or abscess without bleeding: Secondary | ICD-10-CM | POA: Diagnosis not present

## 2023-09-02 DIAGNOSIS — R1030 Lower abdominal pain, unspecified: Secondary | ICD-10-CM | POA: Diagnosis not present

## 2023-09-02 DIAGNOSIS — K7689 Other specified diseases of liver: Secondary | ICD-10-CM | POA: Diagnosis not present

## 2023-09-02 DIAGNOSIS — Q631 Lobulated, fused and horseshoe kidney: Secondary | ICD-10-CM | POA: Diagnosis not present

## 2023-09-02 LAB — URINE CULTURE

## 2023-09-03 ENCOUNTER — Ambulatory Visit: Payer: Self-pay | Admitting: Family

## 2023-09-14 ENCOUNTER — Other Ambulatory Visit: Payer: Self-pay | Admitting: Nurse Practitioner

## 2023-09-14 ENCOUNTER — Other Ambulatory Visit: Payer: Self-pay | Admitting: Family Medicine

## 2023-09-14 DIAGNOSIS — F411 Generalized anxiety disorder: Secondary | ICD-10-CM

## 2023-09-14 DIAGNOSIS — M797 Fibromyalgia: Secondary | ICD-10-CM

## 2023-09-14 DIAGNOSIS — L2989 Other pruritus: Secondary | ICD-10-CM

## 2023-09-14 DIAGNOSIS — F99 Mental disorder, not otherwise specified: Secondary | ICD-10-CM

## 2023-10-02 DIAGNOSIS — G4733 Obstructive sleep apnea (adult) (pediatric): Secondary | ICD-10-CM | POA: Diagnosis not present

## 2023-10-13 DIAGNOSIS — G43719 Chronic migraine without aura, intractable, without status migrainosus: Secondary | ICD-10-CM | POA: Diagnosis not present

## 2023-10-22 ENCOUNTER — Encounter: Payer: Self-pay | Admitting: Family Medicine

## 2023-10-22 ENCOUNTER — Ambulatory Visit: Payer: Self-pay

## 2023-10-22 ENCOUNTER — Ambulatory Visit: Admitting: Family Medicine

## 2023-10-22 VITALS — BP 108/58 | HR 81 | Temp 98.2°F | Ht 61.0 in | Wt 168.0 lb

## 2023-10-22 DIAGNOSIS — F411 Generalized anxiety disorder: Secondary | ICD-10-CM

## 2023-10-22 DIAGNOSIS — M797 Fibromyalgia: Secondary | ICD-10-CM | POA: Diagnosis not present

## 2023-10-22 DIAGNOSIS — I87309 Chronic venous hypertension (idiopathic) without complications of unspecified lower extremity: Secondary | ICD-10-CM | POA: Diagnosis not present

## 2023-10-22 DIAGNOSIS — R202 Paresthesia of skin: Secondary | ICD-10-CM | POA: Diagnosis not present

## 2023-10-22 NOTE — Telephone Encounter (Signed)
 Pt has appt

## 2023-10-22 NOTE — Telephone Encounter (Signed)
 FYI Only or Action Required?: FYI only for provider.  Patient was last seen in primary care on 09/01/2023 by Lavell Bari LABOR, FNP.  Called Nurse Triage reporting Leg Pain.  Symptoms began several weeks ago.  Interventions attempted: Other: compression socks, new shoes.  Symptoms are: stable.  Triage Disposition: See PCP When Office is Open (Within 3 Days)  Patient/caregiver understands and will follow disposition?: Yes    Copied from CRM #[id number]. Topic: Clinical - Red Word Triage >> Oct 22, 2023 10:36 AM Donna BRAVO wrote: Red Word that prompted transfer to Nurse Triage: patient has pain in feet, woke up this morning checked  BP right arm 99/69 O2  74  BP right BP lower leg 151/105   O2  75   BP Lower Left leg  162/111 O2  80 Reason for Disposition  [1] MODERATE pain (e.g., interferes with normal activities, limping) AND [2] present > 3 days  Answer Assessment - Initial Assessment Questions Patient states the pain in legs/feet, no swelling. She says she read up on PAD and decided to take a BP in her legs. Compression socks help the pain and she bought new shoes. Her O2 is 95-96%. Advised visit today with PCP.     1. ONSET: When did the pain start?      Few weeks ago 2. LOCATION: Where is the pain located?      Both lower legs and feet 3. PAIN: How bad is the pain?    (Scale 1-10; or mild, moderate, severe)     4 4. WORK OR EXERCISE: Has there been any recent work or exercise that involved this part of the body?      No 5. CAUSE: What do you think is causing the leg pain?     Unsure 6. OTHER SYMPTOMS: Do you have any other symptoms? (e.g., chest pain, back pain, breathing difficulty, swelling, rash, fever, numbness, weakness)     A little tingling, a little winded may be from asthma  Protocols used: Leg Pain-A-AH

## 2023-10-22 NOTE — Progress Notes (Signed)
 Subjective:  Patient ID: Amy Gray, female    DOB: May 27, 1960  Age: 63 y.o. MRN: [id number]  CC: bilateral leg and foot pain (X 2 weeks)   HPI  Discussed the use of AI scribe software for clinical note transcription with the patient, who gave verbal consent to proceed.  History of Present Illness Amy Gray is a 63 year old female who presents with bilateral lower leg and foot pain.  She has been experiencing bilateral lower leg and foot pain for the past few weeks. The pain is described as 'achy' and 'tingling,' with a burning sensation affecting the entire lower legs and feet, both front and back. It is constant and worsens when sitting, but improves somewhat with walking.  She has tried using compression socks and new tennis shoes to alleviate the pain, but these measures have not been effective. She also added inserts to her shoes for heel support. She notes feeling weak when standing up and describes a tingling sensation that is present even while sitting.  She checked her blood pressure at home and found it to be unusually high in her legs compared to her arms. The right leg measured 151/unknown and 161/111, while the left leg measured 161/111. Her usual blood pressure is not this high, prompting her to seek medical attention.  No swelling in the legs and no knee pain with motion. She feels weak when standing and experiences tingling and burning sensations in her lower legs and feet.          10/22/2023   12:19 PM 08/27/2023    9:00 AM 08/17/2023    9:54 AM  Depression screen PHQ 2/9  Decreased Interest 0 0 0  Down, Depressed, Hopeless 0 0 0  PHQ - 2 Score 0 0 0  Altered sleeping   0  Tired, decreased energy   0  Change in appetite   0  Feeling bad or failure about yourself    0  Trouble concentrating   0  Moving slowly or fidgety/restless   0  Suicidal thoughts   0  PHQ-9 Score   0  Difficult doing work/chores   Not difficult at all    History Amy Gray  has a past medical history of Anxiety, Asthma, COPD (chronic obstructive pulmonary disease) (HCC), Depression, Emphysema of lung (HCC), GERD (gastroesophageal reflux disease), Hyperlipidemia, Migraines, Osteoarthritis, and Sleep apnea.   She has a past surgical history that includes Cervical fusion (01/20/2013); Rhinoplasty; Abdominal hysterectomy; Brain surgery (05/2016); Spine surgery (2015); and Tubal ligation (1986).   Her family history includes Arthritis in her father and mother; Asthma in her mother; Heart attack in her father; Heart disease in her father and mother; Hypertension in her sister.She reports that she has never smoked. She has never used smokeless tobacco. She reports that she does not drink alcohol and does not use drugs.    ROS Review of Systems  Constitutional: Negative.   HENT: Negative.    Eyes:  Negative for visual disturbance.  Respiratory:  Negative for shortness of breath.   Cardiovascular:  Negative for chest pain.  Gastrointestinal:  Negative for abdominal pain.  Musculoskeletal:  Negative for arthralgias.    Objective:  BP (!) 108/58   Pulse 81   Temp 98.2 F (36.8 C)   Ht 5' 1 (1.549 m)   Wt 168 lb (76.2 kg)   SpO2 96%   BMI 31.74 kg/m   BP Readings from Last 3 Encounters:  10/22/23 (!) 108/58  09/01/23 104/69  08/27/23 99/64    Wt Readings from Last 3 Encounters:  10/22/23 168 lb (76.2 kg)  09/01/23 168 lb (76.2 kg)  08/27/23 167 lb 12.8 oz (76.1 kg)     Physical Exam Constitutional:      General: She is not in acute distress.    Appearance: She is well-developed.  Cardiovascular:     Rate and Rhythm: Normal rate and regular rhythm.  Pulmonary:     Breath sounds: Normal breath sounds.  Musculoskeletal:        General: Normal range of motion.  Skin:    General: Skin is warm and dry.  Neurological:     Mental Status: She is alert and oriented to person, place, and time.    Physical Exam VITALS: BP- 108/ GENERAL: Alert,  cooperative, well developed, no acute distress. HEENT: Normocephalic, normal oropharynx, moist mucous membranes. CHEST: Clear to auscultation bilaterally, no wheezes, rhonchi, or crackles. CARDIOVASCULAR: Normal heart rate and rhythm, S1 and S2 normal without murmurs. ABDOMEN: Soft, non-tender, non-distended, without organomegaly, normal bowel sounds. EXTREMITIES: No cyanosis or edema. Pulses strong, good range of motion in extremities. NEUROLOGICAL: Cranial nerves grossly intact, moves all extremities without gross motor or sensory deficit.   Assessment & Plan:  Fibromyalgia syndrome  Generalized anxiety disorder  Paresthesias -     Ambulatory referral to Vascular Surgery  Chronic peripheral venous hypertension -     Ambulatory referral to Vascular Surgery    Assessment and Plan Assessment & Plan Lower extremity pain and paresthesia   She experiences bilateral lower extremity pain and paresthesia, described as achy, tingling, and burning sensations in the lower legs and feet for several weeks. Symptoms worsen when sitting and improve with walking. Weakness occurs when standing. Compression socks and new shoes have not alleviated symptoms. Discontinue compression socks as they may worsen tingling. Evaluate for potential neuropathy.  Possible peripheral neuropathy   There is concern for peripheral neuropathy due to tingling and burning in the lower extremities, though no definitive diagnosis has been made. Evaluate for neuropathy as a potential cause of symptoms.  Abnormal lower extremity blood pressures   Abnormal blood pressure readings in the lower extremities show higher readings compared to the arms. Right ankle blood pressure was 173/121 and right calf was 165/110. Left ankle was 158/95 and left calf was 166/108. Further evaluation is needed to rule out vascular issues. Refer to a vascular specialist for further evaluation.       Follow-up: No follow-ups on file.  Butler Der, M.D.

## 2023-10-25 ENCOUNTER — Encounter: Payer: Self-pay | Admitting: Family Medicine

## 2023-10-26 ENCOUNTER — Other Ambulatory Visit: Payer: Self-pay | Admitting: Family Medicine

## 2023-10-27 ENCOUNTER — Encounter: Payer: Self-pay | Admitting: Family Medicine

## 2023-10-27 DIAGNOSIS — J310 Chronic rhinitis: Secondary | ICD-10-CM | POA: Diagnosis not present

## 2023-10-27 DIAGNOSIS — J4550 Severe persistent asthma, uncomplicated: Secondary | ICD-10-CM | POA: Diagnosis not present

## 2023-10-27 DIAGNOSIS — J398 Other specified diseases of upper respiratory tract: Secondary | ICD-10-CM | POA: Diagnosis not present

## 2023-10-27 DIAGNOSIS — J479 Bronchiectasis, uncomplicated: Secondary | ICD-10-CM | POA: Diagnosis not present

## 2023-11-02 ENCOUNTER — Encounter

## 2023-11-02 DIAGNOSIS — R1013 Epigastric pain: Secondary | ICD-10-CM | POA: Diagnosis not present

## 2023-11-02 DIAGNOSIS — Z1211 Encounter for screening for malignant neoplasm of colon: Secondary | ICD-10-CM | POA: Diagnosis not present

## 2023-11-02 DIAGNOSIS — R110 Nausea: Secondary | ICD-10-CM | POA: Diagnosis not present

## 2023-11-11 ENCOUNTER — Ambulatory Visit
Admission: RE | Admit: 2023-11-11 | Discharge: 2023-11-11 | Disposition: A | Source: Ambulatory Visit | Attending: Family Medicine | Admitting: Family Medicine

## 2023-11-11 DIAGNOSIS — Z1231 Encounter for screening mammogram for malignant neoplasm of breast: Secondary | ICD-10-CM

## 2023-11-17 ENCOUNTER — Other Ambulatory Visit: Payer: Self-pay | Admitting: Family Medicine

## 2023-11-17 DIAGNOSIS — Z1211 Encounter for screening for malignant neoplasm of colon: Secondary | ICD-10-CM | POA: Diagnosis not present

## 2023-11-17 DIAGNOSIS — D123 Benign neoplasm of transverse colon: Secondary | ICD-10-CM | POA: Diagnosis not present

## 2023-11-17 DIAGNOSIS — R928 Other abnormal and inconclusive findings on diagnostic imaging of breast: Secondary | ICD-10-CM

## 2023-11-17 DIAGNOSIS — K2950 Unspecified chronic gastritis without bleeding: Secondary | ICD-10-CM | POA: Diagnosis not present

## 2023-11-17 DIAGNOSIS — J45909 Unspecified asthma, uncomplicated: Secondary | ICD-10-CM | POA: Diagnosis not present

## 2023-11-17 DIAGNOSIS — K3189 Other diseases of stomach and duodenum: Secondary | ICD-10-CM | POA: Diagnosis not present

## 2023-11-17 DIAGNOSIS — K635 Polyp of colon: Secondary | ICD-10-CM | POA: Diagnosis not present

## 2023-11-25 DIAGNOSIS — J479 Bronchiectasis, uncomplicated: Secondary | ICD-10-CM | POA: Diagnosis not present

## 2023-11-25 DIAGNOSIS — Z9989 Dependence on other enabling machines and devices: Secondary | ICD-10-CM | POA: Diagnosis not present

## 2023-11-25 DIAGNOSIS — G4733 Obstructive sleep apnea (adult) (pediatric): Secondary | ICD-10-CM | POA: Diagnosis not present

## 2023-11-25 DIAGNOSIS — J4551 Severe persistent asthma with (acute) exacerbation: Secondary | ICD-10-CM | POA: Diagnosis not present

## 2023-11-27 ENCOUNTER — Ambulatory Visit

## 2023-11-27 ENCOUNTER — Ambulatory Visit
Admission: RE | Admit: 2023-11-27 | Discharge: 2023-11-27 | Disposition: A | Discharge status: Home / Self Care | Source: Ambulatory Visit | Attending: Family Medicine | Admitting: Family Medicine

## 2023-11-27 DIAGNOSIS — R920 Mammographic microcalcification found on diagnostic imaging of breast: Secondary | ICD-10-CM | POA: Diagnosis not present

## 2023-11-27 DIAGNOSIS — R928 Other abnormal and inconclusive findings on diagnostic imaging of breast: Secondary | ICD-10-CM

## 2023-12-01 ENCOUNTER — Other Ambulatory Visit: Payer: Self-pay | Admitting: Family Medicine

## 2023-12-03 ENCOUNTER — Ambulatory Visit: Payer: Self-pay

## 2023-12-03 NOTE — Telephone Encounter (Signed)
Noted  -LS
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
## 2023-12-04 ENCOUNTER — Encounter: Payer: Self-pay | Admitting: Family Medicine

## 2023-12-04 ENCOUNTER — Telehealth: Payer: Self-pay | Admitting: Family Medicine

## 2023-12-04 ENCOUNTER — Ambulatory Visit: Admitting: Family Medicine

## 2023-12-04 VITALS — BP 132/67 | HR 95 | Temp 98.4°F | Ht 61.0 in | Wt 166.2 lb

## 2023-12-04 DIAGNOSIS — T23262D Burn of second degree of back of left hand, subsequent encounter: Secondary | ICD-10-CM

## 2023-12-04 DIAGNOSIS — T23262A Burn of second degree of back of left hand, initial encounter: Secondary | ICD-10-CM

## 2023-12-04 MED ORDER — BACITRACIN 500 UNIT/GM EX OINT: 1.0000 | TOPICAL_OINTMENT | Freq: Two times a day (BID) | CUTANEOUS | 0 refills | Status: AC

## 2023-12-04 MED ORDER — MELOXICAM 7.5 MG PO TABS: 7.5000 mg | ORAL_TABLET | Freq: Every day | ORAL | 0 refills | Status: DC

## 2023-12-04 MED ORDER — CEPHALEXIN 500 MG PO CAPS: 500.0000 mg | ORAL_CAPSULE | Freq: Four times a day (QID) | ORAL | 0 refills | Status: AC
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
## 2023-12-11 ENCOUNTER — Encounter: Payer: Self-pay | Admitting: Family Medicine

## 2023-12-11 ENCOUNTER — Ambulatory Visit (INDEPENDENT_AMBULATORY_CARE_PROVIDER_SITE_OTHER): Admitting: Family Medicine

## 2023-12-11 VITALS — BP 105/69 | HR 74 | Temp 98.0°F | Ht 61.0 in | Wt 167.2 lb

## 2023-12-11 DIAGNOSIS — L03114 Cellulitis of left upper limb: Secondary | ICD-10-CM | POA: Diagnosis not present

## 2023-12-11 DIAGNOSIS — T23262D Burn of second degree of back of left hand, subsequent encounter: Secondary | ICD-10-CM

## 2023-12-11 NOTE — Patient Instructions (Signed)
 Burn Care, Adult A burn is an injury to the skin or the tissues under the skin. It may be caused by a fire, hot liquid or steam, chemicals, electricity, or the sun. There are three types of burns: First degree. These burns are similar to a sunburn. They may cause your skin to be red, slightly swollen, and tender. They may be treated at home. Second degree. These burns are very painful. They may cause your skin to turn very red, swell, leak fluid, look shiny, and blister. In many cases, these burns may be treated at home. If they cover your hands, feet, face, or genitals, get help from a health care provider. Third degree. These burns are the most severe. They may not be painful, but you may feel pain around the edges of them. Your skin may turn white or black and may look charred, dry, and leathery. These burns cause lasting damage. If you get a third-degree burn, get help right away. Treatment will depend on the type of burn you have. Taking care of your burn can help to prevent pain and infection. It can also help the burn heal more quickly. How to care for a first-degree burn Right after the burn: Rinse or soak the burn under cool water for 5 minutes or more. Put a cool, wet cloth (cool compress) on your skin. This may help with pain. Do not put ice on your burn. This can cause more damage. Caring for the burn Clean and care for the burn as told by your provider. You may be told to: Use mild soap and water to clean the area. Use a clean cloth to pat the burned area dry after cleaning it. Do not rub or scrub the burn. Put lotion or aloe vera gel on your skin. How to care for a second-degree burn Right after the burn: Rinse or soak the burn under cool water. Do this for 5-10 minutes. Do not put ice on your burn. This can cause more damage. Take off any jewelry or clothing near the burn. Lightly cover the burn with a clean cloth. Caring for the burn Clean and care for the burn as told by your  provider. You may be told to: Clean or rinse out the burned area. Put a cream or ointment on the burn. You may need to use an antibiotic cream that has silver in it. This can kill bacteria. Place a germ-free (sterile) dressing over the burn. A dressing is a bandage that is put over a burn to help it heal. Raise (elevate) the injured area above the level of your heart while you are sitting or lying down. How to care for a third-degree burn Right after the burn: Lightly cover the burn with a clean, dry cloth. Get help right away. You may need to: Stay in the hospital. Have surgery to remove burned tissue or get a skin graft. Get fluids through an IV. Caring for the burn Clean and care for the burn as told by your provider. You may be told to: Clean or rinse out the burn. Put a cream or ointment on the burn. Put a sterile dressing in the burned area (packing). Put a sterile dressing over the burn. Use pressure (compression) dressings. Elevate the injured area above the level of your heart while you are sitting or lying down. Wear splints or immobilizers as told by your provider. Do exercises as told by your provider. Rest as told by your provider. Do not do sports or  other physical activities until your provider says that you can. How to prevent infection when caring for a burn  Take these steps to prevent infection and more damage to the tissue. Make sure you: Wash your hands with soap and water for at least 20 seconds before and after you care for your burn. If soap and water are not available, use hand sanitizer. Wear clean gloves as told by your provider. Do not put butter, oil, toothpaste, or other home remedies on the burn. Do not scratch or pick at the burn. Do not break any blisters. Do not peel the skin. Do not rub your burn, even when cleaning it. Check your burn every day for signs of infection. Check for: More redness, swelling, or pain. Warmth. Pus or a bad smell. Red  streaks around the burn. Follow these instructions at home Medicines Take over-the-counter and prescription medicines only as told by your provider. If you were prescribed antibiotics, take or apply them as told by your provider. Do not stop using the antibiotic even if you start to feel better. General instructions Do not use any products that contain nicotine or tobacco. These products include cigarettes, chewing tobacco, and vaping devices, such as e-cigarettes. If you need help quitting, ask your provider. Drink enough fluid to keep your pee (urine) pale yellow. Protect your burn from the sun. Contact a health care provider if: Your burn does not get better, or it gets worse. You have any signs of infection. Your burn starts to look different or gets black or red spots. Your pain does not get better with medicine. You have anxiety or depression after the injury. Get help right away if: You have red streaks near the burn. You are in severe pain. This information is not intended to replace advice given to you by your health care provider. Make sure you discuss any questions you have with your health care provider. Document Revised: 01/23/2022 Document Reviewed: 01/22/2022 Elsevier Patient Education  2024 [employer].
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
## 2023-12-14 DIAGNOSIS — K080 Exfoliation of teeth due to systemic causes: Secondary | ICD-10-CM | POA: Diagnosis not present

## 2023-12-22 ENCOUNTER — Ambulatory Visit: Payer: Self-pay | Admitting: Family Medicine

## 2023-12-22 ENCOUNTER — Encounter: Payer: Self-pay | Admitting: Family Medicine

## 2023-12-22 VITALS — BP 93/64 | HR 52 | Temp 98.2°F | Ht 61.0 in | Wt 166.0 lb

## 2023-12-22 DIAGNOSIS — M797 Fibromyalgia: Secondary | ICD-10-CM | POA: Diagnosis not present

## 2023-12-22 DIAGNOSIS — J479 Bronchiectasis, uncomplicated: Secondary | ICD-10-CM

## 2023-12-22 DIAGNOSIS — K2100 Gastro-esophageal reflux disease with esophagitis, without bleeding: Secondary | ICD-10-CM

## 2023-12-22 DIAGNOSIS — J45909 Unspecified asthma, uncomplicated: Secondary | ICD-10-CM

## 2023-12-22 DIAGNOSIS — E559 Vitamin D deficiency, unspecified: Secondary | ICD-10-CM

## 2023-12-22 DIAGNOSIS — F5105 Insomnia due to other mental disorder: Secondary | ICD-10-CM | POA: Diagnosis not present

## 2023-12-22 DIAGNOSIS — Z0000 Encounter for general adult medical examination without abnormal findings: Secondary | ICD-10-CM | POA: Diagnosis not present

## 2023-12-22 DIAGNOSIS — Z0001 Encounter for general adult medical examination with abnormal findings: Secondary | ICD-10-CM | POA: Diagnosis not present

## 2023-12-22 DIAGNOSIS — G932 Benign intracranial hypertension: Secondary | ICD-10-CM

## 2023-12-22 DIAGNOSIS — N898 Other specified noninflammatory disorders of vagina: Secondary | ICD-10-CM

## 2023-12-22 DIAGNOSIS — F99 Mental disorder, not otherwise specified: Secondary | ICD-10-CM | POA: Diagnosis not present

## 2023-12-22 DIAGNOSIS — E782 Mixed hyperlipidemia: Secondary | ICD-10-CM

## 2023-12-22 DIAGNOSIS — G43709 Chronic migraine without aura, not intractable, without status migrainosus: Secondary | ICD-10-CM

## 2023-12-22 LAB — URINALYSIS
Bilirubin, UA: NEGATIVE
Glucose, UA: NEGATIVE
Ketones, UA: NEGATIVE
Leukocytes,UA: NEGATIVE
Nitrite, UA: NEGATIVE
Protein,UA: NEGATIVE
Specific Gravity, UA: 1.02 (ref 1.005–1.030)
Urobilinogen, Ur: 0.2 mg/dL (ref 0.2–1.0)
pH, UA: 5.5 (ref 5.0–7.5)

## 2023-12-22 MED ORDER — TRAZODONE HCL 150 MG PO TABS: ORAL_TABLET | ORAL | 0 refills | Status: AC

## 2023-12-22 MED ORDER — MONTELUKAST SODIUM 10 MG PO TABS: 10.0000 mg | ORAL_TABLET | Freq: Every day | ORAL | 2 refills | Status: DC
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
## 2023-12-23 ENCOUNTER — Other Ambulatory Visit: Payer: Self-pay

## 2023-12-23 DIAGNOSIS — R300 Dysuria: Secondary | ICD-10-CM

## 2023-12-23 LAB — LIPID PANEL
Chol/HDL Ratio: 2.6 ratio (ref 0.0–4.4)
Cholesterol, Total: 183 mg/dL (ref 100–199)
HDL: 70 mg/dL (ref >39)
LDL Chol Calc (NIH): 96 mg/dL (ref 0–99)
Triglycerides: 97 mg/dL (ref 0–149)
VLDL Cholesterol Cal: 17 mg/dL (ref 5–40)

## 2023-12-23 LAB — CBC WITH DIFFERENTIAL/PLATELET
Basophils Absolute: 0 x10E3/uL (ref 0.0–0.2)
Basos: 0 %
EOS (ABSOLUTE): 0.2 x10E3/uL (ref 0.0–0.4)
Eos: 2 %
Hematocrit: 42.4 % (ref 34.0–46.6)
Hemoglobin: 13.4 g/dL (ref 11.1–15.9)
Immature Grans (Abs): 0 x10E3/uL (ref 0.0–0.1)
Immature Granulocytes: 0 %
Lymphocytes Absolute: 2.4 x10E3/uL (ref 0.7–3.1)
Lymphs: 36 %
MCH: 30.2 pg (ref 26.6–33.0)
MCHC: 31.6 g/dL (ref 31.5–35.7)
MCV: 96 fL (ref 79–97)
Monocytes Absolute: 0.7 x10E3/uL (ref 0.1–0.9)
Monocytes: 11 %
Neutrophils Absolute: 3.3 x10E3/uL (ref 1.4–7.0)
Neutrophils: 51 %
Platelets: 261 x10E3/uL (ref 150–450)
RBC: 4.43 x10E6/uL (ref 3.77–5.28)
RDW: 13.2 % (ref 11.7–15.4)
WBC: 6.7 x10E3/uL (ref 3.4–10.8)

## 2023-12-23 LAB — CMP14+EGFR
ALT: 17 IU/L (ref 0–32)
AST: 18 IU/L (ref 0–40)
Albumin: 4.4 g/dL (ref 3.9–4.9)
Alkaline Phosphatase: 144 IU/L — ABNORMAL HIGH (ref 49–135)
BUN/Creatinine Ratio: 14 (ref 12–28)
BUN: 14 mg/dL (ref 8–27)
Bilirubin Total: 0.4 mg/dL (ref 0.0–1.2)
CO2: 21 mmol/L (ref 20–29)
Calcium: 9.5 mg/dL (ref 8.7–10.3)
Chloride: 105 mmol/L (ref 96–106)
Creatinine, Ser: 1.01 mg/dL — ABNORMAL HIGH (ref 0.57–1.00)
Globulin, Total: 2.3 g/dL (ref 1.5–4.5)
Glucose: 73 mg/dL (ref 70–99)
Potassium: 4.2 mmol/L (ref 3.5–5.2)
Sodium: 140 mmol/L (ref 134–144)
Total Protein: 6.7 g/dL (ref 6.0–8.5)
eGFR: 63 mL/min/1.73 (ref >59)

## 2023-12-23 LAB — VITAMIN D 25 HYDROXY (VIT D DEFICIENCY, FRACTURES): Vit D, 25-Hydroxy: 65.3 ng/mL (ref 30.0–100.0)

## 2023-12-24 ENCOUNTER — Other Ambulatory Visit

## 2023-12-24 ENCOUNTER — Ambulatory Visit: Payer: Self-pay | Admitting: Family Medicine

## 2023-12-24 DIAGNOSIS — R300 Dysuria: Secondary | ICD-10-CM | POA: Diagnosis not present

## 2023-12-24 LAB — URINALYSIS, ROUTINE W REFLEX MICROSCOPIC
Bilirubin, UA: NEGATIVE
Glucose, UA: NEGATIVE
Ketones, UA: NEGATIVE
Leukocytes,UA: NEGATIVE
Nitrite, UA: NEGATIVE
Protein,UA: NEGATIVE
RBC, UA: NEGATIVE
Specific Gravity, UA: 1.015 (ref 1.005–1.030)
Urobilinogen, Ur: 0.2 mg/dL (ref 0.2–1.0)
pH, UA: 6.5 (ref 5.0–7.5)

## 2023-12-28 DIAGNOSIS — G4733 Obstructive sleep apnea (adult) (pediatric): Secondary | ICD-10-CM | POA: Diagnosis not present

## 2024-02-26 ENCOUNTER — Other Ambulatory Visit: Payer: Self-pay | Admitting: Family Medicine

## 2024-02-26 ENCOUNTER — Encounter: Payer: Self-pay | Admitting: Family Medicine

## 2024-02-26 DIAGNOSIS — J45909 Unspecified asthma, uncomplicated: Secondary | ICD-10-CM

## 2024-02-26 DIAGNOSIS — K2100 Gastro-esophageal reflux disease with esophagitis, without bleeding: Secondary | ICD-10-CM

## 2024-02-26 DIAGNOSIS — F411 Generalized anxiety disorder: Secondary | ICD-10-CM

## 2024-02-26 NOTE — Telephone Encounter (Signed)
 Stacks NTBS in March for 3 mos FU RFs sent to pharmacy

## 2024-02-26 NOTE — Telephone Encounter (Signed)
 Called to schedule appt no answer no voicemail Letter mailed

## 2024-02-29 ENCOUNTER — Encounter

## 2024-02-29 ENCOUNTER — Encounter: Admitting: Family Medicine

## 2024-03-30 ENCOUNTER — Encounter: Admitting: Vascular Surgery

## 2024-03-30 ENCOUNTER — Ambulatory Visit (HOSPITAL_COMMUNITY)

## 2024-08-15 ENCOUNTER — Ambulatory Visit: Payer: Self-pay
